# Patient Record
Sex: Male | Born: 1940
Health system: Southern US, Community
[De-identification: ages and names within clinical notes are randomized; demographics above are authoritative.]

## PROBLEM LIST (undated history)

## (undated) DIAGNOSIS — T7840XA Allergy, unspecified, initial encounter: Secondary | ICD-10-CM

## (undated) DIAGNOSIS — K648 Other hemorrhoids: Secondary | ICD-10-CM

## (undated) DIAGNOSIS — M1990 Unspecified osteoarthritis, unspecified site: Secondary | ICD-10-CM

## (undated) DIAGNOSIS — N529 Male erectile dysfunction, unspecified: Secondary | ICD-10-CM

## (undated) DIAGNOSIS — I10 Essential (primary) hypertension: Secondary | ICD-10-CM

## (undated) DIAGNOSIS — E785 Hyperlipidemia, unspecified: Secondary | ICD-10-CM

## (undated) DIAGNOSIS — I2510 Atherosclerotic heart disease of native coronary artery without angina pectoris: Secondary | ICD-10-CM

## (undated) HISTORY — DX: Other hemorrhoids: K64.8

## (undated) HISTORY — DX: Unspecified osteoarthritis, unspecified site: M19.90

## (undated) HISTORY — DX: Male erectile dysfunction, unspecified: N52.9

## (undated) HISTORY — DX: Atherosclerotic heart disease of native coronary artery without angina pectoris: I25.10

## (undated) HISTORY — DX: Hyperlipidemia, unspecified: E78.5

## (undated) HISTORY — DX: Allergy, unspecified, initial encounter: T78.40XA

## (undated) HISTORY — DX: Essential (primary) hypertension: I10

---

## 1997-06-09 HISTORY — PX: CORONARY ARTERY BYPASS GRAFT: SHX141

## 1997-06-11 ENCOUNTER — Inpatient Hospital Stay (HOSPITAL_COMMUNITY): Admission: RE | Admit: 1997-06-11 | Discharge: 1997-06-16 | Payer: Self-pay | Admitting: Cardiology

## 1997-07-07 ENCOUNTER — Encounter (HOSPITAL_COMMUNITY): Admission: RE | Admit: 1997-07-07 | Discharge: 1997-10-05 | Payer: Self-pay | Admitting: Cardiology

## 2003-08-12 ENCOUNTER — Ambulatory Visit (HOSPITAL_COMMUNITY): Admission: RE | Admit: 2003-08-12 | Discharge: 2003-08-12 | Payer: Self-pay | Admitting: Internal Medicine

## 2003-12-24 ENCOUNTER — Ambulatory Visit: Payer: Self-pay | Admitting: Family Medicine

## 2004-04-21 ENCOUNTER — Ambulatory Visit: Payer: Self-pay | Admitting: Family Medicine

## 2004-05-09 HISTORY — PX: HERNIA REPAIR: SHX51

## 2004-05-19 ENCOUNTER — Ambulatory Visit (HOSPITAL_BASED_OUTPATIENT_CLINIC_OR_DEPARTMENT_OTHER): Admission: RE | Admit: 2004-05-19 | Discharge: 2004-05-19 | Payer: Self-pay | Admitting: Surgery

## 2004-05-19 ENCOUNTER — Ambulatory Visit (HOSPITAL_COMMUNITY): Admission: RE | Admit: 2004-05-19 | Discharge: 2004-05-19 | Payer: Self-pay | Admitting: Surgery

## 2004-05-19 ENCOUNTER — Encounter (INDEPENDENT_AMBULATORY_CARE_PROVIDER_SITE_OTHER): Payer: Self-pay | Admitting: Specialist

## 2004-06-24 ENCOUNTER — Ambulatory Visit: Payer: Self-pay | Admitting: Family Medicine

## 2004-07-01 ENCOUNTER — Ambulatory Visit: Payer: Self-pay | Admitting: Family Medicine

## 2004-07-11 ENCOUNTER — Ambulatory Visit: Payer: Self-pay | Admitting: Internal Medicine

## 2004-07-15 ENCOUNTER — Ambulatory Visit: Payer: Self-pay | Admitting: Internal Medicine

## 2004-07-15 DIAGNOSIS — K648 Other hemorrhoids: Secondary | ICD-10-CM | POA: Insufficient documentation

## 2004-07-26 ENCOUNTER — Ambulatory Visit: Payer: Self-pay | Admitting: Internal Medicine

## 2005-06-26 ENCOUNTER — Ambulatory Visit: Payer: Self-pay | Admitting: Family Medicine

## 2005-07-10 ENCOUNTER — Ambulatory Visit: Payer: Self-pay | Admitting: Family Medicine

## 2005-07-25 ENCOUNTER — Ambulatory Visit: Payer: Self-pay

## 2005-07-28 ENCOUNTER — Ambulatory Visit: Payer: Self-pay | Admitting: Internal Medicine

## 2005-11-03 ENCOUNTER — Ambulatory Visit: Payer: Self-pay | Admitting: Family Medicine

## 2006-06-11 ENCOUNTER — Ambulatory Visit: Payer: Self-pay | Admitting: Family Medicine

## 2006-06-11 LAB — CONVERTED CEMR LAB
ALT: 26 units/L (ref 0–40)
AST: 28 units/L (ref 0–37)
Albumin: 4.3 g/dL (ref 3.5–5.2)
Alkaline Phosphatase: 75 units/L (ref 39–117)
BUN: 23 mg/dL (ref 6–23)
Basophils Absolute: 0 10*3/uL (ref 0.0–0.1)
Basophils Relative: 0.3 % (ref 0.0–1.0)
Bilirubin, Direct: 0.1 mg/dL (ref 0.0–0.3)
CO2: 31 meq/L (ref 19–32)
Calcium: 9.6 mg/dL (ref 8.4–10.5)
Chloride: 110 meq/L (ref 96–112)
Cholesterol: 178 mg/dL (ref 0–200)
Creatinine, Ser: 0.8 mg/dL (ref 0.4–1.5)
Eosinophils Absolute: 0.2 10*3/uL (ref 0.0–0.6)
Eosinophils Relative: 3.2 % (ref 0.0–5.0)
GFR calc Af Amer: 125 mL/min
GFR calc non Af Amer: 103 mL/min
Glucose, Bld: 95 mg/dL (ref 70–99)
HCT: 45.5 % (ref 39.0–52.0)
HDL: 55.2 mg/dL (ref >39.0)
Hemoglobin: 15.7 g/dL (ref 13.0–17.0)
LDL Cholesterol: 95 mg/dL (ref 0–99)
Lymphocytes Relative: 26.1 % (ref 12.0–46.0)
MCHC: 34.6 g/dL (ref 30.0–36.0)
MCV: 94.3 fL (ref 78.0–100.0)
Monocytes Absolute: 0.8 10*3/uL — ABNORMAL HIGH (ref 0.2–0.7)
Monocytes Relative: 12.5 % — ABNORMAL HIGH (ref 3.0–11.0)
Neutro Abs: 3.9 10*3/uL (ref 1.4–7.7)
Neutrophils Relative %: 57.9 % (ref 43.0–77.0)
PSA: 0.95 ng/mL (ref 0.10–4.00)
Platelets: 191 10*3/uL (ref 150–400)
Potassium: 4.7 meq/L (ref 3.5–5.1)
RBC: 4.82 M/uL (ref 4.22–5.81)
RDW: 11.4 % — ABNORMAL LOW (ref 11.5–14.6)
Sodium: 147 meq/L — ABNORMAL HIGH (ref 135–145)
TSH: 1.23 microintl units/mL (ref 0.35–5.50)
Total Bilirubin: 1.3 mg/dL — ABNORMAL HIGH (ref 0.3–1.2)
Total CHOL/HDL Ratio: 3.2
Total Protein: 7.5 g/dL (ref 6.0–8.3)
Triglycerides: 137 mg/dL (ref 0–149)
VLDL: 27 mg/dL (ref 0–40)
WBC: 6.6 10*3/uL (ref 4.5–10.5)

## 2006-06-26 ENCOUNTER — Ambulatory Visit: Payer: Self-pay | Admitting: Family Medicine

## 2006-07-06 ENCOUNTER — Ambulatory Visit: Payer: Self-pay | Admitting: Family Medicine

## 2006-07-11 ENCOUNTER — Ambulatory Visit: Payer: Self-pay | Admitting: Internal Medicine

## 2006-08-20 ENCOUNTER — Ambulatory Visit: Payer: Self-pay | Admitting: Internal Medicine

## 2006-09-17 ENCOUNTER — Telehealth (INDEPENDENT_AMBULATORY_CARE_PROVIDER_SITE_OTHER): Payer: Self-pay | Admitting: *Deleted

## 2006-10-04 DIAGNOSIS — I10 Essential (primary) hypertension: Secondary | ICD-10-CM | POA: Insufficient documentation

## 2006-10-04 DIAGNOSIS — E785 Hyperlipidemia, unspecified: Secondary | ICD-10-CM | POA: Insufficient documentation

## 2006-10-04 DIAGNOSIS — F528 Other sexual dysfunction not due to a substance or known physiological condition: Secondary | ICD-10-CM | POA: Insufficient documentation

## 2006-10-04 DIAGNOSIS — J309 Allergic rhinitis, unspecified: Secondary | ICD-10-CM | POA: Insufficient documentation

## 2006-10-11 DIAGNOSIS — R1013 Epigastric pain: Secondary | ICD-10-CM | POA: Insufficient documentation

## 2006-10-15 ENCOUNTER — Ambulatory Visit: Payer: Self-pay | Admitting: Family Medicine

## 2006-10-18 ENCOUNTER — Encounter: Admission: RE | Admit: 2006-10-18 | Discharge: 2006-10-18 | Payer: Self-pay | Admitting: Family Medicine

## 2006-10-18 ENCOUNTER — Telehealth: Payer: Self-pay | Admitting: Family Medicine

## 2006-11-01 ENCOUNTER — Ambulatory Visit: Payer: Self-pay | Admitting: Family Medicine

## 2007-04-13 DIAGNOSIS — E7800 Pure hypercholesterolemia, unspecified: Secondary | ICD-10-CM | POA: Insufficient documentation

## 2007-04-13 DIAGNOSIS — R1310 Dysphagia, unspecified: Secondary | ICD-10-CM | POA: Insufficient documentation

## 2007-04-13 DIAGNOSIS — J90 Pleural effusion, not elsewhere classified: Secondary | ICD-10-CM | POA: Insufficient documentation

## 2007-04-23 ENCOUNTER — Ambulatory Visit: Payer: Self-pay | Admitting: Family Medicine

## 2007-04-23 DIAGNOSIS — H8110 Benign paroxysmal vertigo, unspecified ear: Secondary | ICD-10-CM | POA: Insufficient documentation

## 2007-07-07 ENCOUNTER — Encounter: Payer: Self-pay | Admitting: Family Medicine

## 2007-07-08 ENCOUNTER — Ambulatory Visit: Payer: Self-pay | Admitting: Family Medicine

## 2007-07-08 DIAGNOSIS — I2510 Atherosclerotic heart disease of native coronary artery without angina pectoris: Secondary | ICD-10-CM | POA: Insufficient documentation

## 2007-07-08 DIAGNOSIS — M1990 Unspecified osteoarthritis, unspecified site: Secondary | ICD-10-CM | POA: Insufficient documentation

## 2007-07-08 LAB — CONVERTED CEMR LAB
ALT: 26 units/L (ref 0–53)
AST: 23 units/L (ref 0–37)
Albumin: 4.1 g/dL (ref 3.5–5.2)
Alkaline Phosphatase: 83 units/L (ref 39–117)
BUN: 14 mg/dL (ref 6–23)
Basophils Absolute: 0 10*3/uL (ref 0.0–0.1)
Basophils Relative: 0.3 % (ref 0.0–1.0)
Bilirubin Urine: NEGATIVE
Bilirubin, Direct: 0.1 mg/dL (ref 0.0–0.3)
Blood in Urine, dipstick: NEGATIVE
CO2: 31 meq/L (ref 19–32)
Calcium: 9.5 mg/dL (ref 8.4–10.5)
Chloride: 107 meq/L (ref 96–112)
Cholesterol: 147 mg/dL (ref 0–200)
Creatinine, Ser: 0.8 mg/dL (ref 0.4–1.5)
Eosinophils Absolute: 0.1 10*3/uL (ref 0.0–0.7)
Eosinophils Relative: 1.8 % (ref 0.0–5.0)
GFR calc Af Amer: 124 mL/min
GFR calc non Af Amer: 103 mL/min
Glucose, Bld: 103 mg/dL — ABNORMAL HIGH (ref 70–99)
Glucose, Urine, Semiquant: NEGATIVE
HCT: 46.7 % (ref 39.0–52.0)
HDL: 55.3 mg/dL (ref >39.0)
Hemoglobin: 16.1 g/dL (ref 13.0–17.0)
Ketones, urine, test strip: NEGATIVE
LDL Cholesterol: 68 mg/dL (ref 0–99)
Lymphocytes Relative: 23.8 % (ref 12.0–46.0)
MCHC: 34.4 g/dL (ref 30.0–36.0)
MCV: 93.2 fL (ref 78.0–100.0)
Monocytes Absolute: 0.6 10*3/uL (ref 0.1–1.0)
Monocytes Relative: 10.8 % (ref 3.0–12.0)
Neutro Abs: 3.8 10*3/uL (ref 1.4–7.7)
Neutrophils Relative %: 63.3 % (ref 43.0–77.0)
Nitrite: NEGATIVE
PSA: 0.57 ng/mL (ref 0.10–4.00)
Platelets: 180 10*3/uL (ref 150–400)
Potassium: 4.8 meq/L (ref 3.5–5.1)
Protein, U semiquant: NEGATIVE
RBC: 5.01 M/uL (ref 4.22–5.81)
RDW: 11.6 % (ref 11.5–14.6)
Sodium: 143 meq/L (ref 135–145)
Specific Gravity, Urine: 1.02
TSH: 1.09 microintl units/mL (ref 0.35–5.50)
Total Bilirubin: 1 mg/dL (ref 0.3–1.2)
Total CHOL/HDL Ratio: 2.7
Total Protein: 7.2 g/dL (ref 6.0–8.3)
Triglycerides: 118 mg/dL (ref 0–149)
Urobilinogen, UA: 0.2
VLDL: 24 mg/dL (ref 0–40)
WBC Urine, dipstick: NEGATIVE
WBC: 5.9 10*3/uL (ref 4.5–10.5)
pH: 6

## 2007-07-23 ENCOUNTER — Ambulatory Visit: Payer: Self-pay | Admitting: Internal Medicine

## 2007-08-08 ENCOUNTER — Telehealth: Payer: Self-pay | Admitting: Family Medicine

## 2007-12-23 ENCOUNTER — Telehealth: Payer: Self-pay | Admitting: Family Medicine

## 2008-05-18 ENCOUNTER — Telehealth: Payer: Self-pay | Admitting: Family Medicine

## 2008-07-07 ENCOUNTER — Ambulatory Visit: Payer: Self-pay | Admitting: Family Medicine

## 2008-07-10 ENCOUNTER — Ambulatory Visit: Payer: Self-pay | Admitting: Family Medicine

## 2008-07-16 ENCOUNTER — Telehealth (INDEPENDENT_AMBULATORY_CARE_PROVIDER_SITE_OTHER): Payer: Self-pay | Admitting: *Deleted

## 2008-07-20 ENCOUNTER — Encounter: Payer: Self-pay | Admitting: Cardiovascular Disease

## 2008-07-20 ENCOUNTER — Ambulatory Visit: Payer: Self-pay

## 2008-07-27 ENCOUNTER — Telehealth: Payer: Self-pay | Admitting: Family Medicine

## 2008-08-14 ENCOUNTER — Ambulatory Visit: Payer: Self-pay | Admitting: Family Medicine

## 2008-09-17 ENCOUNTER — Ambulatory Visit: Payer: Self-pay | Admitting: Family Medicine

## 2008-09-24 ENCOUNTER — Encounter: Payer: Self-pay | Admitting: Family Medicine

## 2009-07-15 ENCOUNTER — Ambulatory Visit: Payer: Self-pay | Admitting: Family Medicine

## 2009-07-15 DIAGNOSIS — R0789 Other chest pain: Secondary | ICD-10-CM | POA: Insufficient documentation

## 2009-07-19 ENCOUNTER — Telehealth (INDEPENDENT_AMBULATORY_CARE_PROVIDER_SITE_OTHER): Payer: Self-pay

## 2009-07-20 ENCOUNTER — Encounter: Payer: Self-pay | Admitting: Cardiology

## 2009-07-20 ENCOUNTER — Ambulatory Visit: Payer: Self-pay | Admitting: Cardiology

## 2009-07-20 ENCOUNTER — Ambulatory Visit: Payer: Self-pay

## 2009-07-20 ENCOUNTER — Encounter (HOSPITAL_COMMUNITY): Admission: RE | Admit: 2009-07-20 | Discharge: 2009-09-15 | Payer: Self-pay | Admitting: Family Medicine

## 2009-07-20 ENCOUNTER — Encounter: Payer: Self-pay | Admitting: Family Medicine

## 2009-07-22 ENCOUNTER — Telehealth: Payer: Self-pay | Admitting: Family Medicine

## 2010-02-06 LAB — CONVERTED CEMR LAB
ALT: 25 units/L (ref 0–53)
ALT: 27 units/L (ref 0–53)
AST: 26 units/L (ref 0–37)
AST: 31 units/L (ref 0–37)
Albumin: 4.1 g/dL (ref 3.5–5.2)
Albumin: 4.1 g/dL (ref 3.5–5.2)
Alkaline Phosphatase: 67 units/L (ref 39–117)
Alkaline Phosphatase: 70 units/L (ref 39–117)
BUN: 18 mg/dL (ref 6–23)
BUN: 24 mg/dL — ABNORMAL HIGH (ref 6–23)
Basophils Absolute: 0 10*3/uL (ref 0.0–0.1)
Basophils Absolute: 0 10*3/uL (ref 0.0–0.1)
Basophils Relative: 0.2 % (ref 0.0–3.0)
Basophils Relative: 0.4 % (ref 0.0–3.0)
Bilirubin Urine: NEGATIVE
Bilirubin Urine: NEGATIVE
Bilirubin, Direct: 0.1 mg/dL (ref 0.0–0.3)
Bilirubin, Direct: 0.2 mg/dL (ref 0.0–0.3)
Blood in Urine, dipstick: NEGATIVE
Blood in Urine, dipstick: NEGATIVE
CO2: 26 meq/L (ref 19–32)
CO2: 30 meq/L (ref 19–32)
Calcium: 9.2 mg/dL (ref 8.4–10.5)
Calcium: 9.4 mg/dL (ref 8.4–10.5)
Chloride: 106 meq/L (ref 96–112)
Chloride: 108 meq/L (ref 96–112)
Cholesterol: 131 mg/dL (ref 0–200)
Cholesterol: 142 mg/dL (ref 0–200)
Creatinine, Ser: 0.7 mg/dL (ref 0.4–1.5)
Creatinine, Ser: 0.8 mg/dL (ref 0.4–1.5)
Eosinophils Absolute: 0.1 10*3/uL (ref 0.0–0.7)
Eosinophils Absolute: 0.1 10*3/uL (ref 0.0–0.7)
Eosinophils Relative: 1.6 % (ref 0.0–5.0)
Eosinophils Relative: 2.3 % (ref 0.0–5.0)
GFR calc non Af Amer: 102.36 mL/min (ref >60)
GFR calc non Af Amer: 123.09 mL/min (ref >60)
Glucose, Bld: 88 mg/dL (ref 70–99)
Glucose, Bld: 98 mg/dL (ref 70–99)
Glucose, Urine, Semiquant: NEGATIVE
Glucose, Urine, Semiquant: NEGATIVE
HCT: 43 % (ref 39.0–52.0)
HCT: 45.5 % (ref 39.0–52.0)
HDL: 51.9 mg/dL (ref >39.00)
HDL: 58.6 mg/dL (ref >39.00)
Hemoglobin: 14.9 g/dL (ref 13.0–17.0)
Hemoglobin: 15.7 g/dL (ref 13.0–17.0)
Ketones, urine, test strip: NEGATIVE
Ketones, urine, test strip: NEGATIVE
LDL Cholesterol: 55 mg/dL (ref 0–99)
LDL Cholesterol: 71 mg/dL (ref 0–99)
Lymphocytes Relative: 18.6 % (ref 12.0–46.0)
Lymphocytes Relative: 24 % (ref 12.0–46.0)
Lymphs Abs: 1.3 10*3/uL (ref 0.7–4.0)
Lymphs Abs: 1.4 10*3/uL (ref 0.7–4.0)
MCHC: 34.6 g/dL (ref 30.0–36.0)
MCHC: 34.6 g/dL (ref 30.0–36.0)
MCV: 93.1 fL (ref 78.0–100.0)
MCV: 94 fL (ref 78.0–100.0)
Monocytes Absolute: 0.6 10*3/uL (ref 0.1–1.0)
Monocytes Absolute: 0.7 10*3/uL (ref 0.1–1.0)
Monocytes Relative: 10.2 % (ref 3.0–12.0)
Monocytes Relative: 8.9 % (ref 3.0–12.0)
Neutro Abs: 3.4 10*3/uL (ref 1.4–7.7)
Neutro Abs: 5.4 10*3/uL (ref 1.4–7.7)
Neutrophils Relative %: 63.3 % (ref 43.0–77.0)
Neutrophils Relative %: 70.5 % (ref 43.0–77.0)
Nitrite: NEGATIVE
Nitrite: NEGATIVE
PSA: 0.57 ng/mL (ref 0.10–4.00)
PSA: 0.69 ng/mL (ref 0.10–4.00)
Platelets: 158 10*3/uL (ref 150.0–400.0)
Platelets: 180 10*3/uL (ref 150.0–400.0)
Potassium: 4.2 meq/L (ref 3.5–5.1)
Potassium: 4.3 meq/L (ref 3.5–5.1)
Protein, U semiquant: NEGATIVE
Protein, U semiquant: NEGATIVE
RBC: 4.58 M/uL (ref 4.22–5.81)
RBC: 4.88 M/uL (ref 4.22–5.81)
RDW: 11.5 % (ref 11.5–14.6)
RDW: 12.9 % (ref 11.5–14.6)
Sodium: 141 meq/L (ref 135–145)
Sodium: 145 meq/L (ref 135–145)
Specific Gravity, Urine: 1.02
Specific Gravity, Urine: 1.025
TSH: 0.77 microintl units/mL (ref 0.35–5.50)
TSH: 0.93 microintl units/mL (ref 0.35–5.50)
Total Bilirubin: 1.2 mg/dL (ref 0.3–1.2)
Total Bilirubin: 1.3 mg/dL — ABNORMAL HIGH (ref 0.3–1.2)
Total CHOL/HDL Ratio: 2
Total CHOL/HDL Ratio: 3
Total Protein: 6.8 g/dL (ref 6.0–8.3)
Total Protein: 7.1 g/dL (ref 6.0–8.3)
Triglycerides: 121 mg/dL (ref 0.0–149.0)
Triglycerides: 63 mg/dL (ref 0.0–149.0)
Urobilinogen, UA: 0.2
Urobilinogen, UA: 0.2
VLDL: 12.6 mg/dL (ref 0.0–40.0)
VLDL: 24.2 mg/dL (ref 0.0–40.0)
WBC Urine, dipstick: NEGATIVE
WBC Urine, dipstick: NEGATIVE
WBC: 5.4 10*3/uL (ref 4.5–10.5)
WBC: 7.7 10*3/uL (ref 4.5–10.5)
pH: 5.5
pH: 5.5

## 2010-02-08 NOTE — Progress Notes (Signed)
Summary: Wants results of stress test  Phone Note Call from Patient   Summary of Call: Patient requesting results of his stress test. Patient can be reached at [phone]. Initial call taken by: Darra Lis RMA,  July 27, 2008 1:28 PM    called...normal

## 2010-02-08 NOTE — Assessment & Plan Note (Signed)
Summary: stress test per dr todd/njr    Exercise Tolerance Test Cardiovascular Risk History:      Positive major cardiovascular risk factors include male age over 68 years old, hyperlipidemia, and hypertension.  Negative major cardiovascular risk factors include non-tobacco-user status.        Positive history for target organ damage include ASHD (either angina; prior MI; prior CABG).    Baseline EKG:    Rhythm:     normal sinus    Rate:       75  Exercise Tolerance Test Results:    Ordering MD:        todd    Interpreting MD:     Michaiah Holsopple    Contraindication to ETT:   no    Stress Modality:     exercise-treadmill    MPHR (bpm):        154    85% MPHR (bpm):     131    ST Segment analysis:       At Rest:       normal ST segments-no evidence of significant ST depression       With Exercise:     no evidence of significant ST depression    Arrhythmia:             no    Angina during ETT:     absent (0)    Current Allergies: No known allergies         Complete Medication List: 1)  Lipitor 20 Mg Tabs (Atorvastatin calcium) .Marland Kitchen.. 1 at bedtime 2)  Nitrostat 0.4 Mg Subl (Nitroglycerin) .... Place 1 tablet under tongue as directed 3)  Toprol Xl 50 Mg Tb24 (Metoprolol succinate) .... Take 1 tablet by mouth once a day 4)  Valtrex 1 Gm Tabs (Valacyclovir hcl) .... Take 1/2 tablet by mouth once a day 5)  Viagra 100 Mg Tabs (Sildenafil citrate) .... Take 1 tablet by mouth as directed 6)  Zyrtec Allergy 10 Mg Tabs (Cetirizine hcl) .... Qd 7)  Aspirin 325 Mg Tbec (Aspirin) .... Qd  Cardiovascular Risk Assessment/Plan:      The patient's hypertensive risk group is category C: Target organ damage and/or diabetes.    Exercise Tolerance Test Assessment:    Quality of ETT:   diagnostic    ETT Interpretation:   normal-no evidence of ischemia by ST analysis    ]

## 2010-02-08 NOTE — Assessment & Plan Note (Signed)
Summary: emp patient fasting/mhf   Vital Signs:  Patient Profile:   70 Years Old Male Height:     70 inches Weight:      177 pounds Temp:     98.1 degrees F oral Pulse rate:   60 / minute BP sitting:   130 / 80  (left arm)  Vitals Entered By: Kern Reap CMA (July 08, 2007 9:31 AM)                 Chief Complaint:  cpx.  History of Present Illness: William York is a 70 year old male, who comes in today for annual examination.  Because of underlying coronary disease with a bypass surgery in 1999 history of hyperlipidemia hypertension, allergic rhinitis osteoarthritis, and erectile dysfunction.  He states his health is good.  He has no complaints.  This year.  He had a spell of vertigo that is gradually resolved.  Every still has an occasional spell when he feels lightheaded.  He also had an evaluation of pain in his right upper tibia orthopedic studies were all negative.  Pains gone.  No diagnosis was ever made.    Current Allergies: No known allergies   Past Medical History:    Reviewed history from 10/04/2006 and no changes required:       Allergic rhinitis       Hyperlipidemia       Hypertension       erectile dysfunction       Coronary artery disease CABC 99       Osteoarthritis   Family History:    Reviewed history and no changes required:       father died of Struble, vascular disease.              Mother died from old age 49 brother in good health.  No sisters son, Xzavion and daughter, [name]  Social History:    Reviewed history from 04/23/2007 and no changes required:       Retired       Married       Never Smoked       Alcohol use-yes       Drug use-no       Regular exercise-yes    Review of Systems      See HPI   Physical Exam  General:     Well-developed,well-nourished,in no acute distress; alert,appropriate and cooperative throughout examination Head:     Normocephalic and atraumatic without obvious abnormalities. No apparent alopecia or balding.  Eyes:     No corneal or conjunctival inflammation noted. EOMI. Perrla. Funduscopic exam benign, without hemorrhages, exudates or papilledema. Vision grossly normal. Ears:     External ear exam shows no significant lesions or deformities.  Otoscopic examination reveals clear canals, tympanic membranes are intact bilaterally without bulging, retraction, inflammation or discharge. Hearing is grossly normal bilaterally. Nose:     External nasal examination shows no deformity or inflammation. Nasal mucosa are pink and moist without lesions or exudates. Mouth:     Oral mucosa and oropharynx without lesions or exudates.  Teeth in good repair. Neck:     No deformities, masses, or tenderness noted. Chest Wall:     No deformities, masses, tenderness or gynecomastia noted. Breasts:     No masses or gynecomastia noted Lungs:     Normal respiratory effort, chest expands symmetrically. Lungs are clear to auscultation, no crackles or wheezes. Heart:     Normal rate and regular rhythm. S1 and S2 normal without  gallop, murmur, click, rub or other extra sounds. Abdomen:     Bowel sounds positive,abdomen soft and non-tender without masses, organomegaly or hernias noted. Rectal:     No external abnormalities noted. Normal sphincter tone. No rectal masses or tenderness. Genitalia:     Testes bilaterally descended without nodularity, tenderness or masses. No scrotal masses or lesions. No penis lesions or urethral discharge. Prostate:     Prostate gland firm and smooth, no enlargement, nodularity, tenderness, mass, asymmetry or induration. Msk:     No deformity or scoliosis noted of thoracic or lumbar spine.   Pulses:     R and L carotid,radial,femoral,dorsalis pedis and posterior tibial pulses are full and equal bilaterally Extremities:     No clubbing, cyanosis, edema, or deformity noted with normal full range of motion of all joints.   Neurologic:     No cranial nerve deficits noted. Station and gait  are normal. Plantar reflexes are down-going bilaterally. DTRs are symmetrical throughout. Sensory, motor and coordinative functions appear intact. Skin:     Intact without suspicious lesions or rashes Cervical Nodes:     No lymphadenopathy noted Axillary Nodes:     No palpable lymphadenopathy Inguinal Nodes:     No significant adenopathy Psych:     Cognition and judgment appear intact. Alert and cooperative with normal attention span and concentration. No apparent delusions, illusions, hallucinations    Impression & Recommendations:  Problem # 1:  CORONARY ARTERY DISEASE (ICD-414.00) Assessment: Unchanged  His updated medication list for this problem includes:    Nitrostat 0.4 Mg Subl (Nitroglycerin) .Marland Kitchen... Place 1 tablet under tongue as directed    Toprol Xl 50 Mg Tb24 (Metoprolol succinate) .Marland Kitchen... Take 1 tablet by mouth once a day    Aspirin 325 Mg Tbec (Aspirin) ..... Qd  Orders: TLB-PSA (Prostate Specific Antigen) (84153-PSA) EKG w/ Interpretation (93000)   Problem # 2:  OSTEOARTHRITIS (ICD-715.90) Assessment: Unchanged  The following medications were removed from the medication list:    Meloxicam 15 Mg Tabs (Meloxicam) ..... Stopped  His updated medication list for this problem includes:    Aspirin 325 Mg Tbec (Aspirin) ..... Qd  Orders: TLB-PSA (Prostate Specific Antigen) (84153-PSA)   Problem # 3:  HYPERCHOLESTEROLEMIA (ICD-272.0) Assessment: Improved  His updated medication list for this problem includes:    Lipitor 20 Mg Tabs (Atorvastatin calcium) .Marland Kitchen... 1 at bedtime  Orders: EKG w/ Interpretation (93000)   Problem # 4:  ERECTILE DYSFUNCTION (ICD-302.72) Assessment: Improved  His updated medication list for this problem includes:    Viagra 100 Mg Tabs (Sildenafil citrate) .Marland Kitchen... Take 1 tablet by mouth as directed  Orders: TLB-PSA (Prostate Specific Antigen) (84153-PSA)   Problem # 5:  HYPERTENSION (ICD-401.9) Assessment: Improved  His updated  medication list for this problem includes:    Toprol Xl 50 Mg Tb24 (Metoprolol succinate) .Marland Kitchen... Take 1 tablet by mouth once a day  Orders: TLB-PSA (Prostate Specific Antigen) (84153-PSA) EKG w/ Interpretation (93000)   Problem # 6:  ALLERGIC RHINITIS (ICD-477.9) Assessment: Improved  His updated medication list for this problem includes:    Zyrtec Allergy 10 Mg Tabs (Cetirizine hcl) ..... Qd  Orders: TLB-PSA (Prostate Specific Antigen) (84153-PSA)   Complete Medication List: 1)  Lipitor 20 Mg Tabs (Atorvastatin calcium) .Marland Kitchen.. 1 at bedtime 2)  Nitrostat 0.4 Mg Subl (Nitroglycerin) .... Place 1 tablet under tongue as directed 3)  Toprol Xl 50 Mg Tb24 (Metoprolol succinate) .... Take 1 tablet by mouth once a day 4)  Valtrex 1  Gm Tabs (Valacyclovir hcl) .... Take 1/2 tablet by mouth once a day 5)  Viagra 100 Mg Tabs (Sildenafil citrate) .... Take 1 tablet by mouth as directed 6)  Zyrtec Allergy 10 Mg Tabs (Cetirizine hcl) .... Qd 7)  Aspirin 325 Mg Tbec (Aspirin) .... Qd  Other Orders: Venipuncture (96295) TLB-Lipid Panel (80061-LIPID) TLB-BMP (Basic Metabolic Panel-BMET) (80048-METABOL) TLB-CBC Platelet - w/Differential (85025-CBCD) TLB-Hepatic/Liver Function Pnl (80076-HEPATIC) TLB-TSH (Thyroid Stimulating Hormone) (28413-KGM)   Patient Instructions: 1)  Please schedule a follow-up appointment in 1 year. 2)  please set up a stress test with Dr. Smitty Cords swords   Prescriptions: VIAGRA 100 MG TABS (SILDENAFIL CITRATE) Take 1 tablet by mouth as directed  #6 x 11   Entered and Authorized by:   Roderick Pee MD   Signed by:   Roderick Pee MD on 07/08/2007   Method used:   Print then Give to Patient   RxID:   [id number] NITROSTAT 0.4 MG SUBL (NITROGLYCERIN) Place 1 tablet under tongue as directed  #25 x 1   Entered and Authorized by:   Roderick Pee MD   Signed by:   Roderick Pee MD on 07/08/2007   Method used:   Print then Give to Patient   RxID:   [id number]  JJOACZ ALLERGY 10 MG  TABS (CETIRIZINE HCL) qd  #100 x 3   Entered and Authorized by:   Roderick Pee MD   Signed by:   Roderick Pee MD on 07/08/2007   Method used:   Print then Give to Patient   RxID:   [id number] VALTREX 1 GM TABS (VALACYCLOVIR HCL) Take 1/2 tablet by mouth once a day  #100 x 3   Entered and Authorized by:   Roderick Pee MD   Signed by:   Roderick Pee MD on 07/08/2007   Method used:   Print then Give to Patient   RxID:   [id number] TOPROL XL 50 MG TB24 (METOPROLOL SUCCINATE) Take 1 tablet by mouth once a day  #100 x 3   Entered and Authorized by:   Roderick Pee MD   Signed by:   Roderick Pee MD on 07/08/2007   Method used:   Print then Give to Patient   RxID:   [id number] LIPITOR 20 MG  TABS (ATORVASTATIN CALCIUM) 1 at bedtime  #100 x 3   Entered and Authorized by:   Roderick Pee MD   Signed by:   Roderick Pee MD on 07/08/2007   Method used:   Print then Give to Patient   RxID:   [id number]  ] Laboratory Results   Urine Tests   Date/Time Reported: July 08, 2007 11:05 AM   Routine Urinalysis   Color: yellow Appearance: Clear Glucose: negative   (Normal Range: Negative) Bilirubin: negative   (Normal Range: Negative) Ketone: negative   (Normal Range: Negative) Spec. Gravity: 1.020   (Normal Range: 1.003-1.035) Blood: negative   (Normal Range: Negative) pH: 6.0   (Normal Range: 5.0-8.0) Protein: negative   (Normal Range: Negative) Urobilinogen: 0.2   (Normal Range: 0-1) Nitrite: negative   (Normal Range: Negative) Leukocyte Esterace: negative   (Normal Range: Negative)    Comments: Darra Lis RMA  July 08, 2007 11:06 AM

## 2010-02-08 NOTE — Assessment & Plan Note (Signed)
Summary: PT WILL COME IN FASTING // RS/PT [facility] FROM [facility]   Vital Signs:  Patient profile:   70 year old male Height:      70 inches Weight:      178 pounds BMI:     25.63 Temp:     98.1 degrees F oral BP sitting:   140 / 84  (left arm) Cuff size:   regular  Vitals Entered By: Kern Reap CMA Duncan Dull) (July 15, 2009 8:27 AM) CC: cpx Is Patient Diabetic? No Pain Assessment Patient in pain? no        CC:  cpx.  History of Present Illness: William York is a delightful, 70 year old, married male, nonsmoker, who comes in today for annual physical examination because of underlying hyperlipidemia, hypertension, coronary disease.  His hypertension is treated with Toprol 50 mg daily.  BP 140/84.also Cozaar 75 mg daily.  And one aspirin tablet.  His hyperlipidemia is treated with Lipitor 20 mg nightly Will check fasting lipid panel today.  His underlying coronary disease, currently asymptomatic, however, 3 weeks ago.  He had a bout of chest pain.  He described as a burning tightness and he points to his left upper anterior chest wall as a source of his pain.  He says it went would come and go over 3 to 4 hour period of time.  It did not radiate.  On a scale of one to 10.  He was given a 5.  It suddenly went away and he felt fine.  He had no cardiac or pulmonary symptoms.  This occurred while he was away in [place].  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  However, since these had underlying disease.  Will get him set up for cardiac stress test.  He also uses V  100 mg p.r.n.  He also takes acyclovir 200  mg daily. ....Marland KitchenHere for Medicare AWV:  1.   Risk factors based on Past M, S, F history:......total review negative except for the left-sided chest pain as noted above 2.   Physical Activities: Marland Kitchen..walks plays golf 3 days a week, works at [employer]  The days he doesn't play golf 3.    Depression/mood: .......good no depression 4.   Hearing: .......normal 5.   ADL's: .........Marland Kitchenreviewed no change normal 6.   Fall Risk: .........Marland Kitchenreviewed none 7.   Home Safety: ........Marland Kitchenreviewed normal 8.   Height, weight, &visual acuity:.......Marland Kitchenheight weight, and unchanged.  Anuli exam 9.   Counseling: .........advised cardiac stress test because of recent chest.  10.   Labs ordered based on risk factors: ......  Tod 11.           Referral Coordination 12.           Care Plan 13.            Cognitive Assessment   Allergies (verified): No Known Drug Allergies  Past History:  Past medical, surgical, family and social histories (including risk factors) reviewed, and no changes noted (except as noted below).  Past Medical History: Reviewed history from 07/08/2007 and no changes required. Allergic rhinitis Hyperlipidemia Hypertension erectile dysfunction Coronary artery disease CABC 99 Osteoarthritis  Past Surgical History: CABG.99  Family History: Reviewed history from 07/08/2007 and no changes required. father died of Struble, vascular disease.  Mother died from old age 42 brother in good health.  No sisters  son, Zyir and daughter, [name]  Social History: Reviewed history from 04/23/2007 and no changes required. Retired Married Never Smoked Alcohol use-yes Drug use-no Regular exercise-yes  Review of Systems      See HPI  Physical Exam  General:  Well-developed,well-nourished,in no acute distress; alert,appropriate and cooperative throughout examination Head:  Normocephalic and atraumatic without obvious abnormalities. No apparent alopecia or balding. Eyes:  No corneal or conjunctival inflammation noted. EOMI. Perrla. Funduscopic exam benign, without hemorrhages, exudates or papilledema. Vision grossly normal. Ears:  External ear exam shows no significant lesions or deformities.  Otoscopic examination reveals clear canals, tympanic membranes are intact bilaterally  without bulging, retraction, inflammation or discharge. Hearing is grossly normal bilaterally. Mouth:  Oral mucosa and oropharynx without lesions or exudates.  Teeth in good repair. Neck:  No deformities, masses, or tenderness noted. Chest Wall:  No deformities, masses, tenderness or gynecomastia noted. Breasts:  No masses or gynecomastia noted Lungs:  Normal respiratory effort, chest expands symmetrically. Lungs are clear to auscultation, no crackles or wheezes. Heart:  Normal rate and regular rhythm. S1 and S2 normal without gallop, murmur, click, rub or other extra sounds. Abdomen:  Bowel sounds positive,abdomen soft and non-tender without masses, organomegaly or hernias noted. Rectal:  No external abnormalities noted. Normal sphincter tone. No rectal masses or tenderness. Genitalia:  Testes bilaterally descended without nodularity, tenderness or masses. No scrotal masses or lesions. No penis lesions or urethral discharge. Prostate:  Prostate gland firm and smooth, no enlargement, nodularity, tenderness, mass, asymmetry or induration. Msk:  No deformity or scoliosis noted of thoracic or lumbar spine.   Pulses:  R and L carotid,radial,femoral,dorsalis pedis and posterior tibial pulses are full and equal bilaterally Extremities:  No clubbing, cyanosis, edema, or deformity noted with normal full range of motion of all joints.   Neurologic:  No cranial nerve deficits noted. Station and gait are normal. Plantar reflexes are down-going bilaterally. DTRs are symmetrical throughout. Sensory, motor and coordinative functions appear intact. Skin:  Intact without suspicious lesions or rashes Cervical Nodes:  No lymphadenopathy noted Axillary Nodes:  No palpable lymphadenopathy Inguinal Nodes:  No significant adenopathy Psych:  Cognition and judgment appear intact. Alert and cooperative with normal attention span and concentration. No apparent delusions, illusions, hallucinations   Impression &  Recommendations:  Problem # 1:  CORONARY ARTERY DISEASE (ICD-414.00) Assessment Unchanged  His updated medication list for this problem includes:    Nitrostat 0.4 Mg Subl (Nitroglycerin) .Marland Kitchen... Place 1 tablet under tongue as directed    Toprol Xl 50 Mg Tb24 (Metoprolol succinate) .Marland Kitchen... Take 1 tablet by mouth once a day    Aspirin 325 Mg Tbec (Aspirin) ..... Qd    Cozaar 50 Mg Tabs (Losartan potassium) .Marland Kitchen... 1 and 1/2 qam  Orders: Venipuncture (19147) Prescription Created Electronically [phone] EKG w/ Interpretation (93000) Cardiolite (Cardiolite) TLB-Lipid Panel (80061-LIPID) TLB-BMP (Basic Metabolic Panel-BMET) (80048-METABOL) TLB-CBC Platelet - w/Differential (85025-CBCD) TLB-Hepatic/Liver Function Pnl (80076-HEPATIC) TLB-TSH (Thyroid Stimulating Hormone) (84443-TSH) TLB-PSA (Prostate Specific Antigen) (84153-PSA)  Problem # 2:  HYPERCHOLESTEROLEMIA (ICD-272.0) Assessment: Improved  His updated medication list for this problem includes:    Lipitor 20 Mg Tabs (Atorvastatin calcium) .Marland Kitchen... 1 at bedtime  Orders: Venipuncture (21308) Prescription Created Electronically [phone]) EKG w/ Interpretation (93000) TLB-Lipid Panel (80061-LIPID) TLB-BMP (Basic Metabolic Panel-BMET) (80048-METABOL) TLB-CBC Platelet - w/Differential (85025-CBCD) TLB-Hepatic/Liver Function Pnl (80076-HEPATIC) TLB-TSH (Thyroid Stimulating Hormone) (84443-TSH) TLB-PSA (Prostate Specific Antigen) (84153-PSA)  Problem # 3:  ERECTILE DYSFUNCTION (ICD-302.72) Assessment: Improved  His updated medication list for this problem  includes:    Viagra 100 Mg Tabs (Sildenafil citrate) .Marland Kitchen... Take 1 tablet by mouth as directed  Orders: Venipuncture (44034) Prescription Created Electronically [phone]) TLB-Lipid Panel (80061-LIPID) TLB-BMP (Basic Metabolic Panel-BMET) (80048-METABOL) TLB-CBC Platelet - w/Differential (85025-CBCD) TLB-Hepatic/Liver Function Pnl (80076-HEPATIC) TLB-TSH (Thyroid Stimulating Hormone)  (84443-TSH) TLB-PSA (Prostate Specific Antigen) (84153-PSA)  Problem # 4:  HYPERTENSION (ICD-401.9) Assessment: Improved  His updated medication list for this problem includes:    Toprol Xl 50 Mg Tb24 (Metoprolol succinate) .Marland Kitchen... Take 1 tablet by mouth once a day    Cozaar 50 Mg Tabs (Losartan potassium) .Marland Kitchen... 1 and 1/2 qam  Orders: Venipuncture (56387) Prescription Created Electronically [phone]) TLB-Lipid Panel (80061-LIPID) TLB-BMP (Basic Metabolic Panel-BMET) (80048-METABOL) TLB-CBC Platelet - w/Differential (85025-CBCD) TLB-Hepatic/Liver Function Pnl (80076-HEPATIC) TLB-TSH (Thyroid Stimulating Hormone) (84443-TSH) TLB-PSA (Prostate Specific Antigen) (84153-PSA)  Problem # 5:  CHEST PAIN, ATYPICAL (ICD-786.59) Assessment: New  Orders: Venipuncture (29518) Prescription Created Electronically [phone]) Cardiolite (Cardiolite) TLB-Lipid Panel (80061-LIPID) TLB-BMP (Basic Metabolic Panel-BMET) (80048-METABOL) TLB-CBC Platelet - w/Differential (85025-CBCD) TLB-Hepatic/Liver Function Pnl (80076-HEPATIC) TLB-TSH (Thyroid Stimulating Hormone) (84443-TSH) TLB-PSA (Prostate Specific Antigen) (84153-PSA)  Complete Medication List: 1)  Lipitor 20 Mg Tabs (Atorvastatin calcium) .Marland Kitchen.. 1 at bedtime 2)  Nitrostat 0.4 Mg Subl (Nitroglycerin) .... Place 1 tablet under tongue as directed 3)  Toprol Xl 50 Mg Tb24 (Metoprolol succinate) .... Take 1 tablet by mouth once a day 4)  Viagra 100 Mg Tabs (Sildenafil citrate) .... Take 1 tablet by mouth as directed 5)  Aspirin 325 Mg Tbec (Aspirin) .... Qd 6)  Acyclovir 200 Mg Caps (Acyclovir) .... Take one tab once daily 7)  Cozaar 50 Mg Tabs (Losartan potassium) .Marland Kitchen.. 1 and 1/2 qam  Other Orders: UA Dipstick w/o Micro (automated)  (81003)  Patient Instructions: 1)  we will get to set up for a cardiac stress test ASAP.  In the meantime if you have any symptoms consistent with angina taken nitroglycerin immediately call 911 and come to the  hospital 2)  Please schedule a follow-up appointment in 1 year. Prescriptions: COZAAR 50 MG TABS (LOSARTAN POTASSIUM) 1 and 1/2 qam  #150 x 3   Entered and Authorized by:   Roderick Pee MD   Signed by:   Roderick Pee MD on 07/15/2009   Method used:   Print then Give to Patient   RxID:   [id number] ACYCLOVIR 200 MG  CAPS (ACYCLOVIR) take one tab once daily  #100 x 4   Entered and Authorized by:   Roderick Pee MD   Signed by:   Roderick Pee MD on 07/15/2009   Method used:   Print then Give to Patient   RxID:   [id number] VIAGRA 100 MG TABS (SILDENAFIL CITRATE) Take 1 tablet by mouth as directed  #6 x 11   Entered and Authorized by:   Roderick Pee MD   Signed by:   Roderick Pee MD on 07/15/2009   Method used:   Print then Give to Patient   RxID:   [id number] TOPROL XL 50 MG TB24 (METOPROLOL SUCCINATE) Take 1 tablet by mouth once a day  #100 x 4   Entered and Authorized by:   Roderick Pee MD   Signed by:   Roderick Pee MD on 07/15/2009   Method used:   Print then Give to Patient   RxID:   [id number] NITROSTAT 0.4 MG SUBL (NITROGLYCERIN) Place 1 tablet under tongue as directed  #25 x 1   Entered and Authorized by:  Roderick Pee MD   Signed by:   Roderick Pee MD on 07/15/2009   Method used:   Print then Give to Patient   RxID:   [id number] LIPITOR 20 MG  TABS (ATORVASTATIN CALCIUM) 1 at bedtime  #100 x 4   Entered and Authorized by:   Roderick Pee MD   Signed by:   Roderick Pee MD on 07/15/2009   Method used:   Print then Give to Patient   RxID:   [id number]    Immunization History:  Influenza Immunization History:    Influenza:  historical (10/09/2008)   Laboratory Results   Urine Tests  Date/Time Recieved: July 15, 2009 12:21 PM  Date/Time Reported: July 15, 2009 12:21 PM   Routine Urinalysis   Color: yellow Appearance: Clear Glucose: negative   (Normal Range: Negative) Bilirubin: negative   (Normal Range:  Negative) Ketone: negative   (Normal Range: Negative) Spec. Gravity: 1.025   (Normal Range: 1.003-1.035) Blood: negative   (Normal Range: Negative) pH: 5.5   (Normal Range: 5.0-8.0) Protein: negative   (Normal Range: Negative) Urobilinogen: 0.2   (Normal Range: 0-1) Nitrite: negative   (Normal Range: Negative) Leukocyte Esterace: negative   (Normal Range: Negative)    Comments: Wynona Canes, CMA  July 15, 2009 12:21 PM

## 2010-02-08 NOTE — Assessment & Plan Note (Signed)
Summary: 3 day rov/njr   Vital Signs:  Patient profile:   70 year old male Weight:      181 pounds Temp:     98.2 degrees F oral BP sitting:   168 / 88  (left arm) Cuff size:   regular  Vitals Entered By: Kern Reap CMA (July 10, 2008 3:53 PM)  Reason for Visit follow up blood pressure  History of Present Illness: William York is a 70 year old male, who comes in today for reevaluation of hypertension.  We saw him for a physical examination 3 days ago.  His blood pressure.  The first time was elevated.  He's been monitoring at home and is consistently elevated 168/88.  He is compliant with his Toprol 50 mg daily pulse ranges between 50 and 60 on the Toprol  Allergies (verified): No Known Drug Allergies PMH reviewed for relevance  Social History: Reviewed history from 04/23/2007 and no changes required. Retired Married Never Smoked Alcohol use-yes Drug use-no Regular exercise-yes  Review of Systems      See HPI  Physical Exam  General:  Well-developed,well-nourished,in no acute distress; alert,appropriate and cooperative throughout examination Heart:  170/90   Impression & Recommendations:  Problem # 1:  HYPERTENSION (ICD-401.9) Assessment Deteriorated  His updated medication list for this problem includes:    Toprol Xl 50 Mg Tb24 (Metoprolol succinate) .Marland Kitchen... Take 1 tablet by mouth once a day    Cozaar 25 Mg Tabs (Losartan potassium) .Marland Kitchen... Take 1 tablet by mouth every morning  Orders: Prescription Created Electronically [phone])  Complete Medication List: 1)  Lipitor 20 Mg Tabs (Atorvastatin calcium) .Marland Kitchen.. 1 at bedtime 2)  Nitrostat 0.4 Mg Subl (Nitroglycerin) .... Place 1 tablet under tongue as directed 3)  Toprol Xl 50 Mg Tb24 (Metoprolol succinate) .... Take 1 tablet by mouth once a day 4)  Viagra 100 Mg Tabs (Sildenafil citrate) .... Take 1 tablet by mouth as directed 5)  Zyrtec Allergy 10 Mg Tabs (Cetirizine hcl) .... Qd 6)  Aspirin 325 Mg Tbec (Aspirin) ....  Qd 7)  Acyclovir 200 Mg Caps (Acyclovir) .... Take one tab once daily 8)  Cozaar 25 Mg Tabs (Losartan potassium) .... Take 1 tablet by mouth every morning  Patient Instructions: 1)  begin Cozaar 25 mg daily, check her BP daily.  Return in 5 weeks for follow-up Prescriptions: COZAAR 25 MG TABS (LOSARTAN POTASSIUM) Take 1 tablet by mouth every morning  #100 x 3   Entered and Authorized by:   Roderick Pee MD   Signed by:   Roderick Pee MD on 07/10/2008   Method used:   Print then Give to Patient   RxID:   [id number] COZAAR 25 MG TABS (LOSARTAN POTASSIUM) Take 1 tablet by mouth every morning  #30 x 3   Entered and Authorized by:   Roderick Pee MD   Signed by:   Roderick Pee MD on 07/10/2008   Method used:   Print then Give to Patient   RxID:   [id number]

## 2010-02-08 NOTE — Progress Notes (Signed)
  Phone Note Outgoing Call   Call placed by: JAT Call placed to: Patient Summary of Call:  the patient was called .the ultrasound was normal.  He was instructed if he has any further problems to call us for reevaluation.  Initial call taken by: Roderick Pee MD,  October 18, 2006 1:51 PM

## 2010-02-08 NOTE — Assessment & Plan Note (Signed)
Summary: pt will come in fasting/njr   Vital Signs:  Patient profile:   70 year old male Height:      70 inches Weight:      181 pounds BMI:     26.06 Temp:     98.2 degrees F oral BP sitting:   142 / 82  (left arm) Cuff size:   regular  Vitals Entered By: Kern Reap CMA (July 07, 2008 8:27 AM)  Reason for Visit cpx  History of Present Illness: William York is a 70 year old male, retired from [employer], who comes in today for evaluation of hypertension, coronary disease, hyperlipidemia, chronic, HSV, allergic rhinitis erectile dysfunction.  Hypertension is made for Toprol 50 mg daily BP 142/82. Repeat BP by me 180/90.  Pulse 50 and regular.  Patient states he is compliant with his medication.  His underlying coronary artery disease, status post bypass surgery.  These are asymptomatic.  Cardiac-wise.  Exercises daily.  He also plays golf two to 3 times per week without chest pain or shortness of breath.  His last stress test was July 2009 normal by Dr. Smitty Cords here.  Will get him set up in cardiology for a stress test there since we do not have the machine capability here anymore.  Also will have him check his BP daily.  Return in 3 days for follow-up.  Golf uses fiber 100 mg p.r.n. for erectile dysfunction.  No side effects.  Golf uses Zyrtec or TC for allergic rhinitis asymptomatic on medication.  He also takes Lipitor 20 mg nightly for hyperlipidemia.  Will check fasting lipid panel today.  He also takes an aspirin daily, and acyclovir 200 mg daily for chronic HSV infection.  Last tetanus booster 2000, pneumonia vaccines x 2, 1999, and 2006.  Colonoscopy normal 2006  Allergies (verified): No Known Drug Allergies  Past History:  Past medical, surgical, family and social histories (including risk factors) reviewed, and no changes noted (except as noted below).  Past Medical History: Reviewed history from 07/08/2007 and no changes required. Allergic rhinitis Hyperlipidemia  Hypertension erectile dysfunction Coronary artery disease CABC 99 Osteoarthritis  Past Surgical History: Reviewed history from 04/13/2007 and no changes required. CABG  Family History: Reviewed history from 07/08/2007 and no changes required. father died of Struble, vascular disease.  Mother died from old age 35 brother in good health.  No sisters son, Amber and daughter, [name]  Social History: Reviewed history from 04/23/2007 and no changes required. Retired Married Never Smoked Alcohol use-yes Drug use-no Regular exercise-yes  Review of Systems      See HPI  Physical Exam  General:  Well-developed,well-nourished,in no acute distress; alert,appropriate and cooperative throughout examination Head:  Normocephalic and atraumatic without obvious abnormalities. No apparent alopecia or balding. Eyes:  No corneal or conjunctival inflammation noted. EOMI. Perrla. Funduscopic exam benign, without hemorrhages, exudates or papilledema. Vision grossly normal. Ears:  External ear exam shows no significant lesions or deformities.  Otoscopic examination reveals clear canals, tympanic membranes are intact bilaterally without bulging, retraction, inflammation or discharge. Hearing is grossly normal bilaterally. Nose:  External nasal examination shows no deformity or inflammation. Nasal mucosa are pink and moist without lesions or exudates. Mouth:  Oral mucosa and oropharynx without lesions or exudates.  Teeth in good repair. Neck:  No deformities, masses, or tenderness noted. Chest Wall:  No deformities, masses, tenderness or gynecomastia noted. Breasts:  No masses or gynecomastia noted Lungs:  Normal respiratory effort, chest expands symmetrically. Lungs are clear to auscultation, no crackles  or wheezes. Heart:  Normal rate and regular rhythm. S1 and S2 normal without gallop, murmur, click, rub or other extra sounds. Abdomen:  Bowel sounds positive,abdomen soft and non-tender without  masses, organomegaly or hernias noted. Rectal:  No external abnormalities noted. Normal sphincter tone. No rectal masses or tenderness. Genitalia:  Testes bilaterally descended without nodularity, tenderness or masses. No scrotal masses or lesions. No penis lesions or urethral discharge. Prostate:  Prostate gland firm and smooth, no enlargement, nodularity, tenderness, mass, asymmetry or induration. Msk:  No deformity or scoliosis noted of thoracic or lumbar spine.   Pulses:  R and L carotid,radial,femoral,dorsalis pedis and posterior tibial pulses are full and equal bilaterally Extremities:  No clubbing, cyanosis, edema, or deformity noted with normal full range of motion of all joints.   Neurologic:  No cranial nerve deficits noted. Station and gait are normal. Plantar reflexes are down-going bilaterally. DTRs are symmetrical throughout. Sensory, motor and coordinative functions appear intact. Skin:  Intact without suspicious lesions or rashes Cervical Nodes:  No lymphadenopathy noted Axillary Nodes:  No palpable lymphadenopathy Inguinal Nodes:  No significant adenopathy Psych:  Cognition and judgment appear intact. Alert and cooperative with normal attention span and concentration. No apparent delusions, illusions, hallucinations   Impression & Recommendations:  Problem # 1:  CORONARY ARTERY DISEASE (ICD-414.00) Assessment Unchanged  His updated medication list for this problem includes:    Nitrostat 0.4 Mg Subl (Nitroglycerin) .Marland Kitchen... Place 1 tablet under tongue as directed    Toprol Xl 50 Mg Tb24 (Metoprolol succinate) .Marland Kitchen... Take 1 tablet by mouth once a day    Aspirin 325 Mg Tbec (Aspirin) ..... Qd  Orders: Prescription Created Electronically [phone]) Venipuncture [phone]) Cardiology Referral (Cardiology) TLB-Lipid Panel (80061-LIPID) TLB-BMP (Basic Metabolic Panel-BMET) (80048-METABOL) TLB-CBC Platelet - w/Differential (85025-CBCD) TLB-Hepatic/Liver Function Pnl (80076-HEPATIC)  TLB-TSH (Thyroid Stimulating Hormone) (84443-TSH) TLB-PSA (Prostate Specific Antigen) (84153-PSA)  Problem # 2:  HYPERCHOLESTEROLEMIA (ICD-272.0) Assessment: Improved  His updated medication list for this problem includes:    Lipitor 20 Mg Tabs (Atorvastatin calcium) .Marland Kitchen... 1 at bedtime  Orders: Prescription Created Electronically [phone]) Venipuncture [phone]) TLB-Lipid Panel (80061-LIPID) TLB-BMP (Basic Metabolic Panel-BMET) (80048-METABOL) TLB-CBC Platelet - w/Differential (85025-CBCD) TLB-Hepatic/Liver Function Pnl (80076-HEPATIC) TLB-TSH (Thyroid Stimulating Hormone) (84443-TSH) TLB-PSA (Prostate Specific Antigen) (84153-PSA)  Problem # 3:  ERECTILE DYSFUNCTION (ICD-302.72) Assessment: Improved  His updated medication list for this problem includes:    Viagra 100 Mg Tabs (Sildenafil citrate) .Marland Kitchen... Take 1 tablet by mouth as directed  Orders: Prescription Created Electronically [phone]) Venipuncture (13086) TLB-Lipid Panel (80061-LIPID) TLB-BMP (Basic Metabolic Panel-BMET) (80048-METABOL) TLB-CBC Platelet - w/Differential (85025-CBCD) TLB-Hepatic/Liver Function Pnl (80076-HEPATIC) TLB-TSH (Thyroid Stimulating Hormone) (84443-TSH) TLB-PSA (Prostate Specific Antigen) (84153-PSA)  Problem # 4:  HYPERTENSION (ICD-401.9) Assessment: Deteriorated  His updated medication list for this problem includes:    Toprol Xl 50 Mg Tb24 (Metoprolol succinate) .Marland Kitchen... Take 1 tablet by mouth once a day  Orders: Prescription Created Electronically [phone]) Venipuncture (96295) UA Dipstick w/o Micro (automated)  (81003) EKG w/ Interpretation (93000) TLB-Lipid Panel (80061-LIPID) TLB-BMP (Basic Metabolic Panel-BMET) (80048-METABOL) TLB-CBC Platelet - w/Differential (85025-CBCD) TLB-Hepatic/Liver Function Pnl (80076-HEPATIC) TLB-TSH (Thyroid Stimulating Hormone) (84443-TSH) TLB-PSA (Prostate Specific Antigen) (84153-PSA)  Problem # 5:  ALLERGIC RHINITIS (ICD-477.9) Assessment: Improved   His updated medication list for this problem includes:    Zyrtec Allergy 10 Mg Tabs (Cetirizine hcl) ..... Qd  Orders: Prescription Created Electronically [phone]) Venipuncture [phone]) TLB-Lipid Panel (80061-LIPID) TLB-BMP (Basic Metabolic Panel-BMET) (80048-METABOL) TLB-CBC Platelet - w/Differential (85025-CBCD) TLB-Hepatic/Liver Function Pnl (80076-HEPATIC) TLB-TSH (Thyroid  Stimulating Hormone) (84443-TSH) TLB-PSA (Prostate Specific Antigen) (84153-PSA)  Complete Medication List: 1)  Lipitor 20 Mg Tabs (Atorvastatin calcium) .Marland Kitchen.. 1 at bedtime 2)  Nitrostat 0.4 Mg Subl (Nitroglycerin) .... Place 1 tablet under tongue as directed 3)  Toprol Xl 50 Mg Tb24 (Metoprolol succinate) .... Take 1 tablet by mouth once a day 4)  Viagra 100 Mg Tabs (Sildenafil citrate) .... Take 1 tablet by mouth as directed 5)  Zyrtec Allergy 10 Mg Tabs (Cetirizine hcl) .... Qd 6)  Aspirin 325 Mg Tbec (Aspirin) .... Qd 7)  Acyclovir 200 Mg Caps (Acyclovir) .... Take one tab once daily  Patient Instructions: 1)  we will set you up in the cardiology office.  This year for your stress test. 2)  Please schedule a follow-up appointment in 1 year. 3)  check a BP 3 times a day starting today.  Return Friday afternoon for follow-up when you return bring all the data &  the device. Prescriptions: VIAGRA 100 MG TABS (SILDENAFIL CITRATE) Take 1 tablet by mouth as directed  #6 x 11   Entered and Authorized by:   Roderick Pee MD   Signed by:   Roderick Pee MD on 07/07/2008   Method used:   Print then Give to Patient   RxID:   [id number] NITROSTAT 0.4 MG SUBL (NITROGLYCERIN) Place 1 tablet under tongue as directed  #25 x 1   Entered and Authorized by:   Roderick Pee MD   Signed by:   Roderick Pee MD on 07/07/2008   Method used:   Print then Give to Patient   RxID:   [id number] ACYCLOVIR 200 MG  CAPS (ACYCLOVIR) take one tab once daily  #100 x 4   Entered and Authorized by:   Roderick Pee MD    Signed by:   Roderick Pee MD on 07/07/2008   Method used:   Print then Give to Patient   RxID:   [id number] TOPROL XL 50 MG TB24 (METOPROLOL SUCCINATE) Take 1 tablet by mouth once a day  #100 x 4   Entered and Authorized by:   Roderick Pee MD   Signed by:   Roderick Pee MD on 07/07/2008   Method used:   Print then Give to Patient   RxID:   [id number] LIPITOR 20 MG  TABS (ATORVASTATIN CALCIUM) 1 at bedtime  #100 x 4   Entered and Authorized by:   Roderick Pee MD   Signed by:   Roderick Pee MD on 07/07/2008   Method used:   Print then Give to Patient   RxID:   [id number]    Immunization History:  Pneumovax Immunization History:    Pneumovax:  historical (01/09/1997)    Pneumovax:  historical (07/01/2004)  Tetanus/Td Immunization History:    Tetanus/Td:  historical (06/26/2006)     Preventive Care Screening  Last Tetanus Booster:    Date:  06/26/2006    Results:  Historical   Colonoscopy:    Date:  07/15/2004    Results:  normal   Last Pneumovax:    Date:  01/09/1997    Results:  Historical   Laboratory Results   Urine Tests    Routine Urinalysis   Color: yellow Appearance: Clear Glucose: negative   (Normal Range: Negative) Bilirubin: negative   (Normal Range: Negative) Ketone: negative   (Normal Range: Negative) Spec. Gravity: 1.020   (Normal Range: 1.003-1.035) Blood: negative   (Normal Range: Negative) pH: 5.5   (  Normal Range: 5.0-8.0) Protein: negative   (Normal Range: Negative) Urobilinogen: 0.2   (Normal Range: 0-1) Nitrite: negative   (Normal Range: Negative) Leukocyte Esterace: negative   (Normal Range: Negative)    Comments: Joanne Chars CMA  July 07, 2008 2:31 PM

## 2010-02-08 NOTE — Progress Notes (Signed)
Summary: Nuclear Pre-Procedure   Phone Note Outgoing Call Call back at Home Phone [phone]   Call placed by: Stanton Kidney, EMT-P,  July 16, 2008 1:49 PM Call placed to: Patient Summary of Call: Reviewed information on [employer] Information Sheet (see scanned document for further details).  Spoke with Pt.    Nuclear Med Background Indications for Stress Test: Evaluation for Ischemia   History: CABG, Myocardial Perfusion Study  History Comments: '99 CABG '03 MPS     Nuclear Pre-Procedure Cardiac Risk Factors: Hypertension, Lipids Height (in): 70

## 2010-02-08 NOTE — Progress Notes (Signed)
Summary: Nuc. Pre-Procedure  Phone Note Outgoing Call Call back at Home Phone [phone]   Call placed by: Irean Hong, RN,  July 19, 2009 3:39 PM Summary of Call: Reviewed information on [employer] Information Sheet (see scanned document for further details).  Spoke with patient's wife.     Nuclear Med Background Indications for Stress Test: Evaluation for Ischemia, Graft Patency   History: CABG, Myocardial Perfusion Study  History Comments: '99 CABG  7/10 MPS: Abnormal septal motion, (-) ischemia, EF=55%, Low risk.  Symptoms: Chest Pain, Chest Tightness    Nuclear Pre-Procedure Cardiac Risk Factors: Hypertension, Lipids Height (in): 70  Nuclear Med Study Referring MD:  J.Todd

## 2010-02-08 NOTE — Assessment & Plan Note (Signed)
Summary: 11mo rov/mm pt rsc/njr   Vital Signs:  Patient profile:   69 year old male Weight:      183 pounds Temp:     98.1 degrees F oral BP sitting:   110 / 74  (left arm) Cuff size:   regular  Vitals Entered By: William York CMA Duncan Dull) (September 17, 2008 10:27 AM)  Reason for Visit follow up bp  History of Present Illness: William York is a 70 year old male, who comes in today for follow-up of hypertension.  He is currently on 50 mg of Cozaar daily 50%.  His blood pressures are normal.  The other 50% are elevated.  BP by me today.  Right and left arm sitting position 150/80.  Allergies: No Known Drug Allergies  Past History:  Past medical, surgical, family and social histories (including risk factors) reviewed, and no changes noted (except as noted below).  Past Medical History: Reviewed history from 07/08/2007 and no changes required. Allergic rhinitis Hyperlipidemia Hypertension erectile dysfunction Coronary artery disease CABC 99 Osteoarthritis  Past Surgical History: Reviewed history from 04/13/2007 and no changes required. CABG  Family History: Reviewed history from 07/08/2007 and no changes required. father died of Struble, vascular disease.  Mother died from old age 8 brother in good health.  No sisters son, Karlo and daughter, [name]  Social History: Reviewed history from 04/23/2007 and no changes required. Retired Married Never Smoked Alcohol use-yes Drug use-no Regular exercise-yes  Review of Systems      See HPI  Physical Exam  General:  Well-developed,well-nourished,in no acute distress; alert,appropriate and cooperative throughout examination Heart:  150/80, right and left arm sitting position   Impression & Recommendations:  Problem # 1:  HYPERTENSION (ICD-401.9) Assessment Improved  The following medications were removed from the medication list:    Cozaar 25 Mg Tabs (Losartan potassium) .Marland Kitchen... Take 1 tablet by mouth every morning His  updated medication list for this problem includes:    Toprol Xl 50 Mg Tb24 (Metoprolol succinate) .Marland Kitchen... Take 1 tablet by mouth once a day    Cozaar 50 Mg Tabs (Losartan potassium) .Marland Kitchen... 1 and 1/2 qam  Orders: Prescription Created Electronically [phone])  Complete Medication List: 1)  Lipitor 20 Mg Tabs (Atorvastatin calcium) .Marland Kitchen.. 1 at bedtime 2)  Nitrostat 0.4 Mg Subl (Nitroglycerin) .... Place 1 tablet under tongue as directed 3)  Toprol Xl 50 Mg Tb24 (Metoprolol succinate) .... Take 1 tablet by mouth once a day 4)  Viagra 100 Mg Tabs (Sildenafil citrate) .... Take 1 tablet by mouth as directed 5)  Zyrtec Allergy 10 Mg Tabs (Cetirizine hcl) .... Qd 6)  Aspirin 325 Mg Tbec (Aspirin) .... Qd 7)  Acyclovir 200 Mg Caps (Acyclovir) .... Take one tab once daily 8)  Cozaar 50 Mg Tabs (Losartan potassium) .Marland Kitchen.. 1 and 1/2 qam  Patient Instructions: 1)  increase the Cozaar to 75 mg daily.  Check a morning blood pressure daily.  If after 4 weeks. y r blood pressures are  normal, then continue that dose.  If after 4 weeks if blood pressures are not all normal, then increase the Cozaar to 100 mg daily. Prescriptions: COZAAR 50 MG TABS (LOSARTAN POTASSIUM) 1 and 1/2 qam  #150 x 3   Entered and Authorized by:   Roderick Pee MD   Signed by:   Roderick Pee MD on 09/17/2008   Method used:   Print then Give to Patient   RxID:   [id number]

## 2010-02-08 NOTE — Procedures (Signed)
Summary: Gastroenterology colon  Gastroenterology colon   Imported By: Donneta Romberg 04/13/2007 13:58:43  _____________________________________________________________________  External Attachment:    Type:   Image     Comment:   External Document

## 2010-02-08 NOTE — Assessment & Plan Note (Signed)
Summary: ABD PAIN/NJR   Vital Signs:  Patient Profile:   70 Years Old Male Weight:      180 pounds (81.82 kg) Temp:     98.2 degrees F (36.78 degrees C) oral BP sitting:   158 / 78  (right arm)  Pt. in pain?   yes    Location:   abdomen    Intensity:   2-8    Type:       dull  Vitals Entered By: Arcola Jansky, RN (October 15, 2006 11:08 AM)                  Chief Complaint:  thurs hs  and "bad pain in stomach" sat "it would come and go" "black stool sat".  History of Present Illness: William York is a 70 year old male, who comes in today for evaluation of abdominal pain.  On October the second around 10 p.m. he noticed the gradual onset of right and left upper quadrant abdominal pain.  He described as a dull ache.  It was constant.  He had decreased appetite, but no fever, nausea, vomiting, or diarrhea.  The next day felt the same.  I took some Pepto-Bismol.  In October the fourth the following day.  He had a black bowel movement.  He is pain reason he was an 8 and, now it's two.  Region.  The pain was constant now comes and goes.  His nitroglycerin was stomach in the past.  Family history negative.  Review of systems otherwise negative.  Acute Visit History:      The patient complains of abdominal pain.  He denies chest pain, constipation, cough, diarrhea, earache, eye symptoms, fever, genitourinary symptoms, headache, musculoskeletal symptoms, nasal discharge, nausea, rash, sinus problems, sore throat, and vomiting.         Current Allergies (reviewed today): No known allergies   Past Medical History:    Reviewed history from 10/04/2006 and no changes required:       Allergic rhinitis       Hyperlipidemia       Hypertension       erectile dysfunction   Family History:    Reviewed history and no changes required:  Social History:    Reviewed history and no changes required:   Risk Factors:  Tobacco use:  never Alcohol use:  no Exercise:  yes   Review of  Systems      See HPI   Physical Exam  General:     Well-developed,well-nourished,in no acute distress; alert,appropriate and cooperative throughout examination Abdomen:     Bowel sounds positive,abdomen soft and non-tender without masses, organomegaly or hernias noted. Rectal:     No external abnormalities noted. Normal sphincter tone. No rectal masses or tenderness.guaiac-negative Prostate:     Prostate gland firm and smooth, no enlargement, nodularity, tenderness, mass, asymmetry or induration.    Impression & Recommendations:  Problem # 1:  EPIGASTRIC PAIN (ICD-789.06) Assessment: New  Orders: Hgb (40347) Radiology Referral (Radiology)   Complete Medication List: 1)  Lipitor 20 Mg Tabs (Atorvastatin calcium) .Marland Kitchen.. 1 at bedtime 2)  Nitrostat 0.4 Mg Subl (Nitroglycerin) .... Place 1 tablet under tongue as directed 3)  Toprol Xl 50 Mg Tb24 (Metoprolol succinate) .... Take 1 tablet by mouth once a day 4)  Valtrex 1 Gm Tabs (Valacyclovir hcl) .... Take 1/2 tablet by mouth once a day 5)  Viagra 100 Mg Tabs (Sildenafil citrate) .... Take 1 tablet by mouth as directed 6)  Zyrtec  Allergy 10 Mg Tabs (Cetirizine hcl) .... Qd 7)  Aspirin 325 Mg Tbec (Aspirin) .... Qd   Patient Instructions: 1)  examination is normal and your hemoglobin is normal.  I think a black bowel was related to Pepto-Bismol.  I want to stay in a fat free diet and would be to set her for an ultrasound of her gallbladder to rule out gallbladder disease.    ]

## 2010-02-08 NOTE — Progress Notes (Signed)
  Phone Note Outgoing Call   Summary of Call: a call [name] to let him know that his cardiac stress test was normal Initial call taken by: Roderick Pee MD,  July 22, 2009 8:32 AM

## 2010-02-08 NOTE — Miscellaneous (Signed)
Summary: Consent for Exercise ECG Test  Consent for Exercise ECG Test   Imported By: Maryln Gottron 07/25/2007 14:58:32  _____________________________________________________________________  External Attachment:    Type:   Image     Comment:   External Document

## 2010-02-08 NOTE — Assessment & Plan Note (Signed)
Summary: Cardiology Nuclear Study  Nuclear Med Background Indications for Stress Test: Evaluation for Ischemia   History: CABG, Myocardial Perfusion Study  History Comments: '99 CABG '03 MPS     Nuclear Pre-Procedure Cardiac Risk Factors: Hypertension, Lipids, Overweight Caffeine/Decaff Intake: none NPO After: 6:30 AM Lungs: clear IV 0.9% NS with Angio Cath: 22g     IV Site: (R) AC IV Started by: Irean Hong RN Chest Size (in) 42     Height (in): 70 Weight (lb): 179 BMI: 25.78  Nuclear Med Study 1 or 2 day study:  1 day     Stress Test Type:  Stress Reading MD:  Charlton Haws, MD     Referring MD:  J.Todd Resting Radionuclide:  Technetium 40m Tetrofosmin     Resting Radionuclide Dose:  11 mCi  Stress Radionuclide:  Technetium 77m Tetrofosmin     Stress Radionuclide Dose:  33 mCi   Stress Protocol Exercise Time (min):  10:05 min     Max HR:  134 bpm     Predicted Max HR: 153 bpm  Max Systolic BP: 237 mm Hg     % Max HR:  88 %     METS: 11.8 Rate Pressure Product:  16109    Stress Test Technologist:  Milana Na EMT-P     Nuclear Technologist:  Domenic Polite CNMT  Rest Procedure  Myocardial perfusion imaging was performed at rest 45 minutes following the intraveneous administration of Myoview Technetium 55m Tetrofosmin.  Stress Procedure  The patient exercised for 10:05.  The patient stopped due to fatigue and denied any chest pain.  There were no significant ST-T wave changes, and occ pvcs.  Myoview was injected at peak exercise and myocardial perfusion imaging was performed after a brief delay.  QPS Raw Data Images:  Normal; no motion artifact; normal heart/lung ratio. Stress Images:  NI: Uniform and normal uptake of tracer in all myocardial segments. Rest Images:  Normal homogeneous uptake in all areas of the myocardium. Subtraction (SDS):  Normal Transient Ischemic Dilatation:  1.04  (Normal <1.22)  Lung/Heart Ratio:  .33  (Normal <0.45)  Quantitative  Gated Spect Images QGS EDV:  125 ml QGS ESV:  57 ml QGS EF:  55 % QGS cine images:  Abnormal septal motion  Findings Low risk nuclear study      Overall Impression  Exercise Capacity: Good exercise capacity. BP Response: Normal blood pressure response. Clinical Symptoms: No chest pain ECG Impression: No significant ST segment change suggestive of ischemia. Overall Impression: Low risk stress nuclear study. Overall Impression Comments: Small apical defect not thought to be clinically significant Likely axis of rotation artifact. No ischemia

## 2010-02-08 NOTE — Assessment & Plan Note (Signed)
Summary: flu shot/njr  Nurse Visit    Prior Medications: LIPITOR 20 MG  TABS (ATORVASTATIN CALCIUM) 1 at bedtime NITROSTAT 0.4 MG SUBL (NITROGLYCERIN) Place 1 tablet under tongue as directed TOPROL XL 50 MG TB24 (METOPROLOL SUCCINATE) Take 1 tablet by mouth once a day VALTREX 1 GM TABS (VALACYCLOVIR HCL) Take 1/2 tablet by mouth once a day VIAGRA 100 MG TABS (SILDENAFIL CITRATE) Take 1 tablet by mouth as directed ZYRTEC ALLERGY 10 MG  TABS (CETIRIZINE HCL) qd ASPIRIN 325 MG  TBEC (ASPIRIN) qd Current Allergies: No known allergies    Influenza Vaccine    Vaccine Type: Fluvax Non-MCR    Given by: Alfred Levins, CMA  Flu Vaccine Consent Questions    Do you have a history of severe allergic reactions to this vaccine? no    Any prior history of allergic reactions to egg and/or gelatin? no    Do you have a sensitivity to the preservative Thimersol? no    Do you have a past history of Guillan-Barre Syndrome? no    Do you currently have an acute febrile illness? no    Have you ever had a severe reaction to latex? no    Vaccine information given and explained to patient? yes   Impression & Recommendations:  lot [id number], EXP 30 jun 09, sanofi pasteur left deltoid IM, 0.5 cc.  Complete Medication List: 1)  Lipitor 20 Mg Tabs (Atorvastatin calcium) .Marland Kitchen.. 1 at bedtime 2)  Nitrostat 0.4 Mg Subl (Nitroglycerin) .... Place 1 tablet under tongue as directed 3)  Toprol Xl 50 Mg Tb24 (Metoprolol succinate) .... Take 1 tablet by mouth once a day 4)  Valtrex 1 Gm Tabs (Valacyclovir hcl) .... Take 1/2 tablet by mouth once a day 5)  Viagra 100 Mg Tabs (Sildenafil citrate) .... Take 1 tablet by mouth as directed 6)  Zyrtec Allergy 10 Mg Tabs (Cetirizine hcl) .... Qd 7)  Aspirin 325 Mg Tbec (Aspirin) .... Qd   Orders Added: 1)  Influenza Vaccine NON MCR [00028]    ]

## 2010-02-08 NOTE — Assessment & Plan Note (Signed)
Summary: Cardiology Nuclear Study  Nuclear Med Background Indications for Stress Test: Graft Patency   History: CABG, Myocardial Perfusion Study  History Comments: '99 CABG  7/10 MPS: Abnormal septal motion, (-) ischemia, EF=55%, Low risk.  Symptoms: Chest Pain, Chest Tightness    Nuclear Pre-Procedure Cardiac Risk Factors: Hypertension, Lipids Caffeine/Decaff Intake: None NPO After: 5:00 AM Lungs: clear IV 0.9% NS with Angio Cath: 20g     IV Site: (R) AC IV Started by: Irean Hong RN Chest Size (in) 42     Height (in): 70 Weight (lb): 175 BMI: 25.20 Tech Comments: Held toprol 24 hrs.  Nuclear Med Study 1 or 2 day study:  There      Stress Test Type:  Stress Reading MD:  Willa Rough, MD     Referring MD:  J.Todd Resting Radionuclide:  Technetium 48m Tetrofosmin     Resting Radionuclide Dose:  11 mCi  Stress Radionuclide:  Technetium 7m Tetrofosmin     Stress Radionuclide Dose:  33 mCi   Stress Protocol Exercise Time (min):  11:00 min     Max HR:  131 bpm     Predicted Max HR:  152 bpm  Max Systolic BP: 231 mm Hg     Percent Max HR:  86.18 %     METS: 13.3 Rate Pressure Product:  16109    Stress Test Technologist:  Milana Na EMT-P     Nuclear Technologist:  Domenic Polite CNMT  Rest Procedure  Myocardial perfusion imaging was performed at rest 45 minutes following the intravenous administration of Myoview Technetium 76m Tetrofosmin.  Stress Procedure  The patient exercised for 11:00. The patient stopped due to fatigue and denied any chest pain.  There were no significant ST-T wave changes.  Myoview was injected at peak exercise and myocardial perfusion imaging was performed after a brief delay.  QPS Raw Data Images:  Patient motion noted; appropriate software correction applied. Stress Images:  There is mild decrease in activity at the apical cap Rest Images:  Normal homogeneous uptake in all areas of the myocardium. Subtraction (SDS):  No evidence of  ischemia. Transient Ischemic Dilatation:  .86  (Normal <1.22)  Lung/Heart Ratio:  .32  (Normal <0.45)  Quantitative Gated Spect Images QGS EDV:  99 ml QGS ESV:  43 ml QGS EF:  56 % QGS cine images:  Good motion  Findings Low risk nuclear study      Overall Impression  Exercise Capacity: Good exercise capacity. BP Response: Normal blood pressure response. Clinical Symptoms: No chest pain ECG Impression: No significant ST segment change suggestive of ischemia. Overall Impression Comments: This study is similar to the study of 07/2008. At that time there was decreased activity in the apical cap. This is seen now. I doubt significant ischemia.

## 2010-02-08 NOTE — Progress Notes (Signed)
Summary: regarding life ins policy chart requested  Medications Added LIPITOR 10 MG TABS (ATORVASTATIN CALCIUM) Take 1 tablet by mouth every night NITROSTAT 0.4 MG SUBL (NITROGLYCERIN) Place 1 tablet under tongue as directed TOPROL XL 50 MG TB24 (METOPROLOL SUCCINATE) Take 1 tablet by mouth once a day VALTREX 1 GM TABS (VALACYCLOVIR HCL) Take 1/2 tablet by mouth once a day VIAGRA 100 MG TABS (SILDENAFIL CITRATE) Take 1 tablet by mouth as directed       Phone Note Call from Patient Call back at Home Phone [phone]   Caller: Patient Call For: dr todd Summary of Call: pt would like dr todd to call regarding stress test 2005 and 2006 for life ins policy Initial call taken by: Heron Sabins,  September 17, 2006 2:08 PM  Follow-up for Phone Call        Call to patient that only stress tests are from 07 and 08.  He says that he's had a stress test every year since his CABG 1999.  He needs all these reports with the doctor's statement of findings each year.  07 & 08 stress tests have been sent, but no statement of results.  Needs all this for insurance reasons.  Will send all to Dr. Tawanna Cooler to review.   Follow-up by: Rudy Jew, RN,  September 17, 2006 4:34 PM  Additional Follow-up for Phone Call Additional follow up Details #1::        please patient we did deal stress test Stowers time I understand why all stress test would have anything to do with insurance    New/Updated Medications: LIPITOR 10 MG TABS (ATORVASTATIN CALCIUM) Take 1 tablet by mouth every night NITROSTAT 0.4 MG SUBL (NITROGLYCERIN) Place 1 tablet under tongue as directed TOPROL XL 50 MG TB24 (METOPROLOL SUCCINATE) Take 1 tablet by mouth once a day VALTREX 1 GM TABS (VALACYCLOVIR HCL) Take 1/2 tablet by mouth once a day VIAGRA 100 MG TABS (SILDENAFIL CITRATE) Take 1 tablet by mouth as directed  I called the insurance company and found out exactly what they needed.  Then I gave to [employer] to send them  the requested info./mae

## 2010-02-08 NOTE — Progress Notes (Signed)
Summary: H1N1 question  Phone Note Call from Patient Call back at Home Phone [phone]   Caller: Patient Call For: Roderick Pee MD Summary of Call: Pt has the opportunity to get the H1N1 vaccine and wants Dr Barbette Or input before he get's it. OK to leav message on home phone Initial call taken by: Sid Falcon LPN,  December 23, 2007 2:51 PM  Follow-up for Phone Call        get it !!!!!!!!!!!!!!!!!!!!! Follow-up by: Roderick Pee MD,  December 23, 2007 2:58 PM  Additional Follow-up for Phone Call Additional follow up Details #1::        Pt informed Additional Follow-up by: Sid Falcon LPN,  December 23, 2007 3:01 PM

## 2010-03-29 ENCOUNTER — Other Ambulatory Visit: Payer: Self-pay | Admitting: Dermatology

## 2010-05-24 NOTE — Assessment & Plan Note (Signed)
[facility]                         GASTROENTEROLOGY OFFICE NOTE   William York, William York                       MRN:          [id number]  DATE:08/20/2006                            DOB:          [birth year]    William York is a very nice 70 year old white male, patient of Dr. Tawanna Cooler,  who is here today for evaluation of hiccups. For past 6 months while  eating, he develops about 3 or 4 hiccups which subside spontaneously. We  saw him for the same problem in 2005. Barium swallow then showed  question of a Zenker's diverticulum, normal peristalsis, a patulous  lower esophageal sphincter, but the barium tablet went through without  any delay. He was put on Protonix 40 mg a day with prompt relief of all  his symptoms for 3 years until last few months. We also saw William York in  the past for screening colonoscopy and rectal bleeding. Exam in July  2006 showed internal hemorrhoids. Next colonoscopy recall would be in 10  years.   PHYSICAL EXAMINATION:  Blood pressure 130/74, pulse 64, and weight 181  pounds. Patient was not examined.   IMPRESSION:  A 70 year old gentleman with intermittent hiccups, most  likely related to a lower esophageal sphincter dysfunction spasm or  gastroesophageal reflux. He denies any dysphagia or odynophagia.   PLAN:  I have given 2 options, 1 is to treat him empirically with  Protonix 40 mg a day for a month or two.The  other option would be  proceed with upper endoscopy and passage of a dilator. Patient elected  the first option with the understanding that if the hiccups return that  we will proceed with upper endoscopy and mostly likely dilation.     William Morton. Juanda Chance, MD  Electronically Signed    DMB/MedQ  DD: 08/20/2006  DT: 08/20/2006  Job #: [id number]   cc:   Tinnie Gens A. Tawanna Cooler, MD

## 2010-05-27 NOTE — Op Note (Signed)
William York, William York                ACCOUNT NO.:  [id number]   MEDICAL RECORD NO.:  [id number]          PATIENT TYPE:  AMB   LOCATION:  DSC                          FACILITY:  [facility]   PHYSICIAN:  Currie Paris, M.D.DATE OF BIRTH:  04-16-1940   DATE OF PROCEDURE:  05/19/2004  DATE OF DISCHARGE:                                 OPERATIVE REPORT   CCS [phone]   PREOPERATIVE DIAGNOSIS:  Right inguinal hernia.   POSTOPERATIVE DIAGNOSIS:  Right inguinal hernia--indirect.   OPERATION PERFORMED:  Repair of right inguinal hernia.   SURGEON:  Currie Paris, M.D.   ANESTHESIA:  MAC.   INDICATIONS FOR PROCEDURE:  This is a 70 year old with a right inguinal  hernia that he wished to have repaired.   DESCRIPTION OF PROCEDURE:  The patient was seen in the holding area and had  no further questions.  He had already initialed the right inguinal area as  the operative site and I confirmed that and initialed it also.  The patient  was taken to the operating room and given intravenous sedation. The groin  area was clipped, prepped and draped.  The time out occurred.   I infiltrated with a combination of 1% Xylocaine with epinephrine and 0.5%  plain Marcaine along the incision line and then below the fascia at the  anterior superior iliac spine.  The incision was made, deepened to the  external oblique aponeurosis with additional local infiltrated as needed.  The aponeurosis was opened in the line of its fibers, elevated off of the  underlying structures and the cord dissected off the inguinal floor.  It was  surrounded with a Penrose drain.  The inguinal floor appeared somewhat thin  but intact.   The cord was opened and the indirect sac identified, stripped off and  ligated at the neck with a suture ligation of 2-0 silk and a tie of 2-0  silk.  It retracted under the deep ring.  I cleaned off the floor so that we  could lay a piece of mesh on it and onlay graft was placed which was  split  laterally to go around the cord.  It was sutured in with a running 2-0  Prolene from medial to lateral along the inferior aspect of the area and  then tacked medially to the internal oblique.  The tails were crossed and  went well laterally, leaving plenty of room for the cord to come through the  mesh.   Everything appeared to be dry.  Additional local had been infiltrated to  help with postoperative analgesia.  The incision was closed with a running 3-  0 Vicryl to close the external oblique, running 3-0 Vicryl on Scarpa's,  running 4-0 Monocryl subcuticular, plus Dermabond.  The patient tolerated  the procedure well.  There were no operative complications.  All counts were  correct.      CJS/MEDQ  D:  05/19/2004  T:  05/19/2004  Job:  [id number]   cc:   Tinnie Gens A. Tawanna Cooler, M.D. [facility]

## 2010-07-21 ENCOUNTER — Telehealth: Payer: Self-pay | Admitting: Family Medicine

## 2010-07-21 ENCOUNTER — Other Ambulatory Visit: Payer: Self-pay | Admitting: Family Medicine

## 2010-07-21 ENCOUNTER — Encounter: Payer: Self-pay | Admitting: Family Medicine

## 2010-07-21 ENCOUNTER — Ambulatory Visit (INDEPENDENT_AMBULATORY_CARE_PROVIDER_SITE_OTHER): Payer: Medicare Other | Admitting: Family Medicine

## 2010-07-21 DIAGNOSIS — B009 Herpesviral infection, unspecified: Secondary | ICD-10-CM

## 2010-07-21 DIAGNOSIS — I10 Essential (primary) hypertension: Secondary | ICD-10-CM

## 2010-07-21 DIAGNOSIS — Z23 Encounter for immunization: Secondary | ICD-10-CM

## 2010-07-21 DIAGNOSIS — E7800 Pure hypercholesterolemia, unspecified: Secondary | ICD-10-CM

## 2010-07-21 DIAGNOSIS — Z0000 Encounter for general adult medical examination without abnormal findings: Secondary | ICD-10-CM

## 2010-07-21 DIAGNOSIS — I2510 Atherosclerotic heart disease of native coronary artery without angina pectoris: Secondary | ICD-10-CM

## 2010-07-21 DIAGNOSIS — F528 Other sexual dysfunction not due to a substance or known physiological condition: Secondary | ICD-10-CM

## 2010-07-21 DIAGNOSIS — Z125 Encounter for screening for malignant neoplasm of prostate: Secondary | ICD-10-CM

## 2010-07-21 LAB — POCT URINALYSIS DIPSTICK
Bilirubin, UA: NEGATIVE
Blood, UA: NEGATIVE
Glucose, UA: NEGATIVE
Ketones, UA: NEGATIVE
Leukocytes, UA: NEGATIVE
Nitrite, UA: NEGATIVE
Protein, UA: NEGATIVE
Spec Grav, UA: 1.025
Urobilinogen, UA: 0.2
pH, UA: 6

## 2010-07-21 LAB — CBC WITH DIFFERENTIAL/PLATELET
Basophils Absolute: 0 10*3/uL (ref 0.0–0.1)
Basophils Relative: 0.4 % (ref 0.0–3.0)
Eosinophils Absolute: 0.1 10*3/uL (ref 0.0–0.7)
Eosinophils Relative: 1.2 % (ref 0.0–5.0)
HCT: 43.3 % (ref 39.0–52.0)
Hemoglobin: 14.7 g/dL (ref 13.0–17.0)
Lymphocytes Relative: 25.4 % (ref 12.0–46.0)
Lymphs Abs: 1.3 10*3/uL (ref 0.7–4.0)
MCHC: 33.9 g/dL (ref 30.0–36.0)
MCV: 95.2 fl (ref 78.0–100.0)
Monocytes Absolute: 0.5 10*3/uL (ref 0.1–1.0)
Monocytes Relative: 9.4 % (ref 3.0–12.0)
Neutro Abs: 3.3 10*3/uL (ref 1.4–7.7)
Neutrophils Relative %: 63.6 % (ref 43.0–77.0)
Platelets: 167 10*3/uL (ref 150.0–400.0)
RBC: 4.54 Mil/uL (ref 4.22–5.81)
RDW: 12.7 % (ref 11.5–14.6)
WBC: 5.2 10*3/uL (ref 4.5–10.5)

## 2010-07-21 LAB — LIPID PANEL
Cholesterol: 150 mg/dL (ref 0–200)
HDL: 65.3 mg/dL (ref >39.00)
LDL Cholesterol: 74 mg/dL (ref 0–99)
Total CHOL/HDL Ratio: 2
Triglycerides: 55 mg/dL (ref 0.0–149.0)
VLDL: 11 mg/dL (ref 0.0–40.0)

## 2010-07-21 LAB — BASIC METABOLIC PANEL
BUN: 25 mg/dL — ABNORMAL HIGH (ref 6–23)
CO2: 28 mEq/L (ref 19–32)
Calcium: 9.1 mg/dL (ref 8.4–10.5)
Chloride: 104 mEq/L (ref 96–112)
Creatinine, Ser: 0.8 mg/dL (ref 0.4–1.5)
GFR: 109.6 mL/min (ref >60.00)
Glucose, Bld: 95 mg/dL (ref 70–99)
Potassium: 4.7 mEq/L (ref 3.5–5.1)
Sodium: 141 mEq/L (ref 135–145)

## 2010-07-21 LAB — HEPATIC FUNCTION PANEL
ALT: 32 U/L (ref 0–53)
AST: 31 U/L (ref 0–37)
Albumin: 4.4 g/dL (ref 3.5–5.2)
Alkaline Phosphatase: 64 U/L (ref 39–117)
Bilirubin, Direct: 0.1 mg/dL (ref 0.0–0.3)
Total Bilirubin: 0.8 mg/dL (ref 0.3–1.2)
Total Protein: 6.7 g/dL (ref 6.0–8.3)

## 2010-07-21 LAB — TSH: TSH: 0.78 u[IU]/mL (ref 0.35–5.50)

## 2010-07-21 LAB — PSA: PSA: 1.03 ng/mL (ref 0.10–4.00)

## 2010-07-21 MED ORDER — METOPROLOL SUCCINATE ER 50 MG PO TB24: 50.0000 mg | ORAL_TABLET | Freq: Every day | ORAL | Status: DC

## 2010-07-21 MED ORDER — ATORVASTATIN CALCIUM 20 MG PO TABS: 20.0000 mg | ORAL_TABLET | Freq: Every day | ORAL | Status: DC

## 2010-07-21 MED ORDER — LOSARTAN POTASSIUM 50 MG PO TABS: 50.0000 mg | ORAL_TABLET | Freq: Every day | ORAL | Status: DC

## 2010-07-21 MED ORDER — ACYCLOVIR 200 MG PO CAPS: 200.0000 mg | ORAL_CAPSULE | ORAL | Status: DC

## 2010-07-21 MED ORDER — SILDENAFIL CITRATE 100 MG PO TABS: 100.0000 mg | ORAL_TABLET | Freq: Every day | ORAL | Status: DC | PRN

## 2010-07-21 MED ORDER — NITROGLYCERIN 0.4 MG SL SUBL: 0.4000 mg | SUBLINGUAL_TABLET | SUBLINGUAL | Status: DC | PRN

## 2010-07-21 NOTE — Patient Instructions (Signed)
Continue her current medications in good health habits.  Return one year or sooner if any problem

## 2010-07-21 NOTE — Progress Notes (Signed)
Subjective:    Patient ID: William York, male    DOB: May 28, 1940, 70 y.o.   MRN: [id number]  HPI   William York is a 70 year old, married male, nonsmoker, who comes in today for Medicare wellness examination because of a history of hypertension, hyperlipidemia, coronary artery disease,,,,,,,,, bypass surgery 13 years ago,,,,,,, mild erectile dysfunction, and occasional HSV.  He takes acyclovir p.r.n. For outbreak.  He takes Cozaar 75 mg daily for hypertension, along with Toprol 50 mg daily, an 81-mg aspirin tablet.  Cardiac-wise he is asymptomatic.  He has no chest pain.  He exercises on a regular basis plays golf or 4 days per week.  He takes Lipitor 20 mg nightly for hyperlipidemia.  Will check lipid panel.  Uses V  100 mg p.r.n.  He gets routine eye care, dental care, hearing normal, activities of daily living.  He exercises on a regular basis as noted above, colonoscopy, recent normal, tetanus, 2008, Pneumovax, x 2, shingles.  Information given.  He does all his own financial work independently.  Home health safety reviewed.  No issues identified.  No guns in the house.  He does have a healthcare power of attorney and living will.    Review of Systems  Constitutional: Negative.   HENT: Negative.   Eyes: Negative.   Respiratory: Negative.   Cardiovascular: Negative.   Gastrointestinal: Negative.   Genitourinary: Negative.   Musculoskeletal: Negative.   Skin: Negative.   Neurological: Negative.   Hematological: Negative.   Psychiatric/Behavioral: Negative.        Objective:   Physical Exam  Constitutional: He is oriented to person, place, and time. He appears well-developed and well-nourished.  HENT:  Head: Normocephalic and atraumatic.  Right Ear: External ear normal.  Left Ear: External ear normal.  Nose: Nose normal.  Mouth/Throat: Oropharynx is clear and moist.  Eyes: Conjunctivae and EOM are normal. Pupils are equal, round, and reactive to light.  Neck: Normal range of  motion. Neck supple. No JVD present. No tracheal deviation present. No thyromegaly present.  Cardiovascular: Normal rate, regular rhythm, normal heart sounds and intact distal pulses.  Exam reveals no gallop and no friction rub.   No murmur heard. Pulmonary/Chest: Effort normal and breath sounds normal. No stridor. No respiratory distress. He has no wheezes. He has no rales. He exhibits no tenderness.  Abdominal: Soft. Bowel sounds are normal. He exhibits no distension and no mass. There is no tenderness. There is no rebound and no guarding.  Genitourinary: Rectum normal, prostate normal and penis normal. Guaiac negative stool. No penile tenderness.  Musculoskeletal: Normal range of motion. He exhibits no edema and no tenderness.  Lymphadenopathy:    He has no cervical adenopathy.  Neurological: He is alert and oriented to person, place, and time. He has normal reflexes. No cranial nerve deficit. He exhibits normal muscle tone.  Skin: Skin is warm and dry. No rash noted. No erythema. No pallor.       Spine the bridge of the nose.  He had a basal cell carcinoma removed by Dr. Mayford Knife  Psychiatric: He has a normal mood and affect. His behavior is normal. Judgment and thought content normal.          Assessment & Plan:  Healthy male  Coronary disease, asymptomatic stress test for confirmation.  HSV.  Continue acyclovir p.r.n.  Hypertension.  Continue Cozaar and Toprol.  Hyperlipidemia.  Continue Lipitor and aspirin.  Erectile dysfunction Viagra p.r.n.  Return in one year, sooner if any problems

## 2010-07-21 NOTE — Telephone Encounter (Signed)
Pt called and said that 4 of his scripts, Losartan, Brand Name Lipitor, Acyclovir, Metroprolol, needed to be sent to [facility], not [facility]. The other 2 meds are [facility].

## 2010-07-22 ENCOUNTER — Telehealth: Payer: Self-pay | Admitting: *Deleted

## 2010-07-22 MED ORDER — LOSARTAN POTASSIUM 50 MG PO TABS: ORAL_TABLET | ORAL | Status: DC

## 2010-07-22 NOTE — Telephone Encounter (Signed)
rx clarification

## 2010-07-22 NOTE — Progress Notes (Signed)
Left message on machine for patient

## 2010-07-27 ENCOUNTER — Ambulatory Visit (HOSPITAL_COMMUNITY): Payer: Medicare Other | Attending: Family Medicine | Admitting: Radiology

## 2010-07-27 ENCOUNTER — Telehealth: Payer: Self-pay

## 2010-07-27 VITALS — Ht 70.0 in | Wt 175.0 lb

## 2010-07-27 DIAGNOSIS — Z951 Presence of aortocoronary bypass graft: Secondary | ICD-10-CM | POA: Insufficient documentation

## 2010-07-27 DIAGNOSIS — I25810 Atherosclerosis of coronary artery bypass graft(s) without angina pectoris: Secondary | ICD-10-CM

## 2010-07-27 DIAGNOSIS — I2510 Atherosclerotic heart disease of native coronary artery without angina pectoris: Secondary | ICD-10-CM

## 2010-07-27 MED ORDER — TECHNETIUM TC 99M TETROFOSMIN IV KIT
11.0000 | PACK | Freq: Once | INTRAVENOUS | Status: AC | PRN
  Administered 2010-07-27: 11 via INTRAVENOUS

## 2010-07-27 MED ORDER — TECHNETIUM TC 99M TETROFOSMIN IV KIT
33.0000 | PACK | Freq: Once | INTRAVENOUS | Status: AC | PRN
  Administered 2010-07-27: 33 via INTRAVENOUS

## 2010-07-27 NOTE — Progress Notes (Signed)
[facility] 3 NUCLEAR MED [address] [place] [address] [phone]  Cardiology Nuclear Med Study  William York is a 70 y.o. male [id number] 1940-02-10   Nuclear Med Background Indication for Stress Test:  Evaluation for Ischemia and Graft Patency History:  '99 CABG; 7/11 ION:GEXBMW, EF=56% Cardiac Risk Factors: Hypertension, Lipids and RBBB  Symptoms:  No cardiac complaints.   Nuclear Pre-Procedure Caffeine/Decaff Intake:  None NPO After: 9:00pm   Lungs:  Clear.   IV 0.9% NS with Angio Cath:  20g  IV Site: R Hand  IV Started by:  William York, CNMT  Chest Size (in):  42 in Cup Size: n/a  Height: 5\' 10"  (1.778 m)  Weight:  175 lb (79.379 kg)  BMI:  Body mass index is 25.11 kg/(m^2). Tech Comments:  Toprol held x 24 hours, no meds. taken this am, per patient.  Advised patient in future to only hold his beta blocker.  William York, CMA-N    Nuclear Med Study 1 or 2 day study: 1 day  Stress Test Type:  Stress  Reading MD: William Ancona, MD  Order Authorizing Provider:  Fredia Sorrow, MD  Resting Radionuclide: Technetium 53m Tetrofosmin  Resting Radionuclide Dose: 11.0 mCi   Stress Radionuclide:  Technetium 87m Tetrofosmin  Stress Radionuclide Dose: 33.0 mCi           Stress Protocol Rest HR: 62 Stress HR: 136  Rest BP: 131/85 Stress BP: 228/83  Exercise Time (min): 12:01 METS: 13.7   Predicted Max HR: 151 bpm % Max HR: 90.07 bpm Rate Pressure Product: 41324   Dose of Adenosine (mg):  n/a Dose of Lexiscan: n/a mg  Dose of Atropine (mg): n/a Dose of Dobutamine: n/a mcg/kg/min (at max HR)  Stress Test Technologist: William York, CMA-N  Nuclear Technologist:  William York, CNMT     Rest Procedure:  Myocardial perfusion imaging was performed at rest 45 minutes following the intravenous administration of Technetium 89m Tetrofosmin.  Rest ECG: IRBBB  Stress Procedure:  The patient exercised for 12:01 on the treadmill utilizing the Bruce  protocol.  The patient stopped due to fatigue and denied any chest pain.  There were no diagnostic ST-T wave changes. There were occasional PVC's with couplets and occasional PAC's. He did have a hypertensive response to exercise with peak BP >228/83 (see tech note).  Technetium 20m Tetrofosmin was injected at peak exercise and myocardial perfusion imaging was performed after a brief delay.  Stress ECG: No significant change from baseline ECG  QPS Raw Data Images:  Normal; no motion artifact; normal heart/lung ratio. Stress Images:  Basal inferior perfusion defect.  Rest Images:  Basal inferior perfusion defect, somewhat less marked than stress.  Subtraction (SDS):  Small partially reversible basal inferior perfusion defect.  Transient Ischemic Dilatation (Normal <1.22):  0.88 Lung/Heart Ratio (Normal <0.45):  0.27  Quantitative Gated Spect Images QGS EDV:  95 ml QGS ESV:  43 ml QGS cine images:  Basal inferior hypokinesis.  QGS EF: 54%  Impression Exercise Capacity:  Excellent exercise capacity. BP Response:  Hypertensive blood pressure response. Clinical Symptoms:  Fatigue, no chest pain.  ECG Impression:  No significant ST segment change suggestive of ischemia. Comparison with Prior Nuclear Study: No significant change from previous study  Overall Impression:  Low risk stress nuclear study.  Partially reversible basal inferior perfusion defect may represent a small area of scar with some ischemia, however it is small and does not appear significantly different from prior myoviews.  Excellent exercise capacity.   William York [employer]

## 2010-07-27 NOTE — Telephone Encounter (Signed)
There are 2 sets of instructions on losartan rx. Pharmacy would like to know which set os correct.   Ref # [id number]. Callback # [phone]

## 2010-07-28 MED ORDER — LOSARTAN POTASSIUM 50 MG PO TABS: ORAL_TABLET | ORAL | Status: DC

## 2010-07-28 NOTE — Telephone Encounter (Signed)
Instructions verified. Quantity corrected to 135

## 2010-07-28 NOTE — Progress Notes (Signed)
nuc med report routed to Dr. Tawanna Cooler 07/28/10 William York

## 2010-08-02 ENCOUNTER — Telehealth: Payer: Self-pay | Admitting: *Deleted

## 2010-08-02 NOTE — Telephone Encounter (Signed)
Pt is asking for Stress test results.

## 2010-08-04 NOTE — Telephone Encounter (Signed)
Please call stress test, normal,,,,,,,,, unchanged from last year

## 2010-08-04 NOTE — Telephone Encounter (Signed)
LMTCB

## 2011-07-24 ENCOUNTER — Encounter: Payer: Self-pay | Admitting: Family Medicine

## 2011-07-24 ENCOUNTER — Ambulatory Visit (INDEPENDENT_AMBULATORY_CARE_PROVIDER_SITE_OTHER): Payer: Medicare Other | Admitting: Family Medicine

## 2011-07-24 VITALS — BP 130/80 | Temp 98.4°F | Ht 70.5 in | Wt 190.0 lb

## 2011-07-24 DIAGNOSIS — R195 Other fecal abnormalities: Secondary | ICD-10-CM

## 2011-07-24 DIAGNOSIS — N138 Other obstructive and reflux uropathy: Secondary | ICD-10-CM

## 2011-07-24 DIAGNOSIS — I2510 Atherosclerotic heart disease of native coronary artery without angina pectoris: Secondary | ICD-10-CM

## 2011-07-24 DIAGNOSIS — E7800 Pure hypercholesterolemia, unspecified: Secondary | ICD-10-CM

## 2011-07-24 DIAGNOSIS — N401 Enlarged prostate with lower urinary tract symptoms: Secondary | ICD-10-CM

## 2011-07-24 DIAGNOSIS — R351 Nocturia: Secondary | ICD-10-CM

## 2011-07-24 DIAGNOSIS — B009 Herpesviral infection, unspecified: Secondary | ICD-10-CM

## 2011-07-24 DIAGNOSIS — F528 Other sexual dysfunction not due to a substance or known physiological condition: Secondary | ICD-10-CM

## 2011-07-24 DIAGNOSIS — I10 Essential (primary) hypertension: Secondary | ICD-10-CM

## 2011-07-24 LAB — CBC WITH DIFFERENTIAL/PLATELET
Basophils Absolute: 0 10*3/uL (ref 0.0–0.1)
Basophils Relative: 0.3 % (ref 0.0–3.0)
Eosinophils Absolute: 0.2 10*3/uL (ref 0.0–0.7)
Eosinophils Relative: 2.7 % (ref 0.0–5.0)
HCT: 44.3 % (ref 39.0–52.0)
Hemoglobin: 15 g/dL (ref 13.0–17.0)
Lymphocytes Relative: 20.6 % (ref 12.0–46.0)
Lymphs Abs: 1.2 10*3/uL (ref 0.7–4.0)
MCHC: 33.8 g/dL (ref 30.0–36.0)
MCV: 93.4 fl (ref 78.0–100.0)
Monocytes Absolute: 0.6 10*3/uL (ref 0.1–1.0)
Monocytes Relative: 9.6 % (ref 3.0–12.0)
Neutro Abs: 4 10*3/uL (ref 1.4–7.7)
Neutrophils Relative %: 66.8 % (ref 43.0–77.0)
Platelets: 174 10*3/uL (ref 150.0–400.0)
RBC: 4.75 Mil/uL (ref 4.22–5.81)
RDW: 12.6 % (ref 11.5–14.6)
WBC: 6 10*3/uL (ref 4.5–10.5)

## 2011-07-24 LAB — HEPATIC FUNCTION PANEL
ALT: 21 U/L (ref 0–53)
AST: 24 U/L (ref 0–37)
Albumin: 4 g/dL (ref 3.5–5.2)
Alkaline Phosphatase: 73 U/L (ref 39–117)
Bilirubin, Direct: 0.1 mg/dL (ref 0.0–0.3)
Total Bilirubin: 0.6 mg/dL (ref 0.3–1.2)
Total Protein: 6.7 g/dL (ref 6.0–8.3)

## 2011-07-24 LAB — POCT URINALYSIS DIPSTICK
Bilirubin, UA: NEGATIVE
Blood, UA: NEGATIVE
Glucose, UA: NEGATIVE
Ketones, UA: NEGATIVE
Leukocytes, UA: NEGATIVE
Nitrite, UA: NEGATIVE
Protein, UA: NEGATIVE
Spec Grav, UA: 1.02
Urobilinogen, UA: 0.2
pH, UA: 5.5

## 2011-07-24 LAB — BASIC METABOLIC PANEL
BUN: 14 mg/dL (ref 6–23)
CO2: 28 mEq/L (ref 19–32)
Calcium: 9.6 mg/dL (ref 8.4–10.5)
Chloride: 107 mEq/L (ref 96–112)
Creatinine, Ser: 0.8 mg/dL (ref 0.4–1.5)
GFR: 104.45 mL/min (ref >60.00)
Glucose, Bld: 98 mg/dL (ref 70–99)
Potassium: 5 mEq/L (ref 3.5–5.1)
Sodium: 142 mEq/L (ref 135–145)

## 2011-07-24 LAB — LIPID PANEL
Cholesterol: 153 mg/dL (ref 0–200)
HDL: 56.6 mg/dL (ref >39.00)
LDL Cholesterol: 72 mg/dL (ref 0–99)
Total CHOL/HDL Ratio: 3
Triglycerides: 123 mg/dL (ref 0.0–149.0)
VLDL: 24.6 mg/dL (ref 0.0–40.0)

## 2011-07-24 LAB — TSH: TSH: 0.95 u[IU]/mL (ref 0.35–5.50)

## 2011-07-24 LAB — PSA: PSA: 0.68 ng/mL (ref 0.10–4.00)

## 2011-07-24 MED ORDER — ACYCLOVIR 200 MG PO CAPS: 200.0000 mg | ORAL_CAPSULE | ORAL | Status: DC

## 2011-07-24 MED ORDER — METOPROLOL SUCCINATE ER 50 MG PO TB24: 50.0000 mg | ORAL_TABLET | Freq: Every day | ORAL | Status: DC

## 2011-07-24 MED ORDER — NITROGLYCERIN 0.4 MG SL SUBL: 0.4000 mg | SUBLINGUAL_TABLET | SUBLINGUAL | Status: DC | PRN

## 2011-07-24 MED ORDER — SILDENAFIL CITRATE 100 MG PO TABS: 100.0000 mg | ORAL_TABLET | Freq: Every day | ORAL | Status: DC | PRN

## 2011-07-24 MED ORDER — ATORVASTATIN CALCIUM 20 MG PO TABS: 20.0000 mg | ORAL_TABLET | Freq: Every day | ORAL | Status: DC

## 2011-07-24 MED ORDER — LOSARTAN POTASSIUM 50 MG PO TABS: ORAL_TABLET | ORAL | Status: DC

## 2011-07-24 NOTE — Patient Instructions (Addendum)
Continue your current medications  We will set up a followup consult and GI  Return in one year sooner if any problems  Set up a time in the next couple weeks to remove the lesion from the left ear

## 2011-07-24 NOTE — Progress Notes (Signed)
  Subjective:    Patient ID: William York, male    DOB: [birth year], 71 y.o.   MRN: [id number]  HPI William York is a 71 year old male married nonsmoker who comes in today for evaluation and a Medicare wellness exam  He has a history of underlying coronary disease had bypass surgery in 1999. He's been asymptomatic exercises on a regular basis. His weight is stable at 190 pounds his blood pressure is 130/80.  He takes acyclovir when necessary and aspirin tablet daily. He takes Cozaar Toprol for hypertension and nitroglycerin when necessary  He's not had to take any nitroglycerin. No chest pain  He takes Viagra when necessary for DD  He gets routine eye care, dental care, colonoscopy and GI 2006 normal, vaccinations tetanus 2012 Pneumovax x2 information given on shingles.  Cognitive function normal he walks on a regular basis home health safety reviewed no issues identified, no guns in the house, he does have a health care power of attorney and living will   Review of Systems  Constitutional: Negative.   HENT: Negative.   Eyes: Negative.   Respiratory: Negative.   Cardiovascular: Negative.   Gastrointestinal: Negative.   Genitourinary: Negative.   Musculoskeletal: Negative.   Skin: Negative.   Neurological: Negative.   Hematological: Negative.   Psychiatric/Behavioral: Negative.        Objective:   Physical Exam  Constitutional: He is oriented to person, place, and time. He appears well-developed and well-nourished.  HENT:  Head: Normocephalic and atraumatic.  Right Ear: External ear normal.  Left Ear: External ear normal.  Nose: Nose normal.  Mouth/Throat: Oropharynx is clear and moist.  Eyes: Conjunctivae and EOM are normal. Pupils are equal, round, and reactive to light.  Neck: Normal range of motion. Neck supple. No JVD present. No tracheal deviation present. No thyromegaly present.  Cardiovascular: Normal rate, regular rhythm, normal heart sounds and intact distal pulses.   Exam reveals no gallop and no friction rub.   No murmur heard. Pulmonary/Chest: Effort normal and breath sounds normal. No stridor. No respiratory distress. He has no wheezes. He has no rales. He exhibits no tenderness.  Abdominal: Soft. Bowel sounds are normal. He exhibits no distension and no mass. There is no tenderness. There is no rebound and no guarding.  Genitourinary: Prostate normal and penis normal. Guaiac positive stool. No penile tenderness.  Musculoskeletal: Normal range of motion. He exhibits no edema and no tenderness.  Lymphadenopathy:    He has no cervical adenopathy.  Neurological: He is alert and oriented to person, place, and time. He has normal reflexes. No cranial nerve deficit. He exhibits normal muscle tone.  Skin: Skin is warm and dry. No rash noted. No erythema. No pallor.  Psychiatric: He has a normal mood and affect. His behavior is normal. Judgment and thought content normal.          Assessment & Plan:  Healthy male  History of hypertension continue current meds  History of hyperlipidemia continue current meds  Coronary disease asymptomatic status post bypass surgery x13 years  Erectile dysfunction continue Viagra  Asymptomatic positive stool guaiac history of internal hemorrhoids however refer back to GI last colonoscopy 9 years ago

## 2011-07-25 ENCOUNTER — Encounter: Payer: Self-pay | Admitting: Internal Medicine

## 2011-08-15 ENCOUNTER — Ambulatory Visit (INDEPENDENT_AMBULATORY_CARE_PROVIDER_SITE_OTHER): Payer: Medicare Other | Admitting: Family Medicine

## 2011-08-15 ENCOUNTER — Encounter: Payer: Self-pay | Admitting: Family Medicine

## 2011-08-15 DIAGNOSIS — L570 Actinic keratosis: Secondary | ICD-10-CM

## 2011-08-15 DIAGNOSIS — I10 Essential (primary) hypertension: Secondary | ICD-10-CM

## 2011-08-15 MED ORDER — LOSARTAN POTASSIUM 50 MG PO TABS: ORAL_TABLET | ORAL | Status: DC

## 2011-08-15 NOTE — Addendum Note (Signed)
Addended by: Kern Reap B on: 08/15/2011 01:59 PM   Modules accepted: Orders

## 2011-08-15 NOTE — Progress Notes (Signed)
  Subjective:    Patient ID: William York, male    DOB: 1940-09-23, 71 y.o.   MRN: [id number]  HPI Sorin is a 71 year old male who comes in today for removal of an abnormal lesion on his left earlobe.  The lesion measures 6 mm x6 mm inside top of his left earlobe. It's scaly elevated and white in color.  After informed consent the lesion was anesthetized with 1% Xylocaine and removed with 2 mm margins. The base was cauterized Band-Aids applied the lesion was sent for pathologic analysis. The patient tolerated the procedure no complications   Review of Systems General and dermatologic review of systems otherwise negative    Objective:   Physical Exam  Procedure see above      Assessment & Plan:  Probable actinic keratosis  Pending

## 2011-08-15 NOTE — Patient Instructions (Signed)
Within 2 weeks we will cardiolith report

## 2011-09-07 ENCOUNTER — Encounter: Payer: Self-pay | Admitting: *Deleted

## 2011-09-26 ENCOUNTER — Ambulatory Visit (INDEPENDENT_AMBULATORY_CARE_PROVIDER_SITE_OTHER): Payer: Medicare Other | Admitting: Internal Medicine

## 2011-09-26 ENCOUNTER — Encounter: Payer: Self-pay | Admitting: Internal Medicine

## 2011-09-26 VITALS — BP 134/60 | HR 80 | Ht 70.0 in | Wt 186.0 lb

## 2011-09-26 DIAGNOSIS — K921 Melena: Secondary | ICD-10-CM

## 2011-09-26 DIAGNOSIS — K648 Other hemorrhoids: Secondary | ICD-10-CM

## 2011-09-26 MED ORDER — HYDROCORTISONE ACETATE 25 MG RE SUPP: 25.0000 mg | Freq: Every day | RECTAL | Status: DC

## 2011-09-26 NOTE — Progress Notes (Signed)
SUTTON HIRSCH 01-31-1940 MRN [id number]   History of Present Illness:  This is a 71 year old white male with Hemoccult-positive stool on his yearly physical exam by Dr.Todd. He denies any visible blood per rectum. His hemoglobin was 15.0 with a hematocrit 44.3. We have seen him in the past in July 2006 for routine colonoscopy due to Hemoccult-positive stool which showed internal hemorrhoids. There were no polyps. There is no family history of colon cancer. He has never had his hemorrhoids treated. He is on aspirin. An upper abdominal ultrasound in October 2008 was normal. Patient denies abdominal pain, weight loss, diarrhea or constipation. He was taking Advil 400 mg 3 times a day for a couple weeks in August but not at the time of his physical exam.   Past Medical History  Diagnosis Date  . Allergy   . Hyperlipidemia   . Hypertension   . ED (erectile dysfunction)   . CAD (coronary artery disease)   . OA (osteoarthritis)   . Internal hemorrhoids    Past Surgical History  Procedure Date  . Coronary artery bypass graft   . Hernia repair     reports that he has never smoked. He has never used smokeless tobacco. He reports that he drinks alcohol. He reports that he does not use illicit drugs. family history includes Colon cancer in his cousin. No Known Allergies      Review of Systems:Negative for dysphagia heartburn abdominal pain  The remainder of the 10 point ROS is negative except as outlined in H&P   Physical Exam: General appearance  Well developed, in no distress. Eyes- non icteric. HEENT nontraumatic, normocephalic. Mouth no lesions, tongue papillated, no cheilosis. Neck supple without adenopathy, thyroid not enlarged, no carotid bruits, no JVD. Lungs Clear to auscultation bilaterally. Cor normal S1, normal S2, regular rhythm, no murmur,  quiet precordium. Abdomen: Soft nontender with normoactive bowel sounds no tenderness. Liver edge at costal margin. Rectal:And  anoscopic exam reveals 2 large external hemorrhoidal tags. Rectal sphincter tone is increased. Anoscope was inserted into the anal canal with some resistance. There were first grade internal hemorrhoids with some hyperemic mucosa and small internal hemorrhoids without prolapse. Stool is trace Hemoccult positive after the anoscope was retracted and I did a digital exam.  Extremities no pedal edema. Skin no lesions. Neurological alert and oriented x 3. Psychological normal mood and affect.  Assessment and Plan:  Problem #1 Hemoccult-positive stool on physical exam in the presence of internal as well as external hemorrhoids and increased rectal sphincter tone. Patient will be due for a recall colonoscopy in July 2016. I believe his hemorrhoids are the cause of his positive stool. We will treat the hemorrhoids with Anusol HC suppositories. We will repeat Hemoccult cards after he completes the [facility] suppositories.  If the stool cards are positive for occult blood, we will proceed with a colonoscopy. If they are negative, then I would wait until recall colonoscopy in July 2016. He is comfortable with this plan.   09/26/2011 Lina Sar

## 2011-09-26 NOTE — Patient Instructions (Addendum)
We have sent the following medications to your pharmacy for you to pick up at your convenience: Anusol Please complete hemoccult cards as per written instructions on the package AFTER you have completed the Anusol suppositories. CC: Dr Kelle Darting

## 2011-10-10 ENCOUNTER — Other Ambulatory Visit: Payer: Self-pay | Admitting: Family Medicine

## 2011-10-10 NOTE — Telephone Encounter (Signed)
Caller: Dodd/Patient; Patient Name: William York; PCP: Governor Specking); Best Callback Phone Number: [phone]; Reason for call: Patient is calling to request a prescription [facility] [address] to allow him to receive his Shingles vaccination (Herpes Zoster vaccination). [facility] states must have [facility] from his provider. Patient is requesting prescription for vaccination be [facility] to [facility] [address]-[phone]. Last office visit was 08/15/11.   OFFICE: PLEASE FOLLOW UP WITH PATIENT IN REGARDS TO REQUEST FOR SHINGLE VACCINATION PRESCRIPTION BEING SENT TO [facility] AS NOTED ABOVE. THANKS

## 2011-10-11 MED ORDER — VARICELLA-ZOSTER IMMUNE GLOB 125 UNITS IJ SOLR: 1.0000 | Freq: Once | INTRAMUSCULAR | Status: DC

## 2011-10-25 ENCOUNTER — Other Ambulatory Visit: Payer: Medicare Other

## 2011-11-02 ENCOUNTER — Telehealth: Payer: Self-pay | Admitting: Internal Medicine

## 2011-11-02 LAB — HEMOCCULT SLIDES (X 3 CARDS)
Fecal Occult Blood: NEGATIVE
OCCULT 1: NEGATIVE
OCCULT 2: NEGATIVE
OCCULT 3: NEGATIVE
OCCULT 4: NEGATIVE
OCCULT 5: NEGATIVE

## 2011-11-02 NOTE — Telephone Encounter (Signed)
Patient advised that the results are negative.  We will call back if Dr. Juanda Chance has any additional recommendations

## 2011-11-03 NOTE — ED Provider Notes (Signed)
Order(s) created erroneously. Erroneous order ID: [id number] Order moved by: Lurline Hare Order move date/time: 11/03/2011  3:26 PM Source Patient:     [id number] Source Contact: 07/24/2011 Destination Patient:   [id number] Destination Contact: 10/17/2010

## 2012-07-23 ENCOUNTER — Ambulatory Visit (INDEPENDENT_AMBULATORY_CARE_PROVIDER_SITE_OTHER): Payer: Medicare Other | Admitting: Family Medicine

## 2012-07-23 ENCOUNTER — Encounter: Payer: Self-pay | Admitting: Family Medicine

## 2012-07-23 VITALS — Temp 98.4°F | Ht 70.25 in | Wt 190.0 lb

## 2012-07-23 DIAGNOSIS — I10 Essential (primary) hypertension: Secondary | ICD-10-CM

## 2012-07-23 DIAGNOSIS — I2510 Atherosclerotic heart disease of native coronary artery without angina pectoris: Secondary | ICD-10-CM

## 2012-07-23 DIAGNOSIS — M1990 Unspecified osteoarthritis, unspecified site: Secondary | ICD-10-CM

## 2012-07-23 DIAGNOSIS — Z0000 Encounter for general adult medical examination without abnormal findings: Secondary | ICD-10-CM

## 2012-07-23 DIAGNOSIS — Z125 Encounter for screening for malignant neoplasm of prostate: Secondary | ICD-10-CM

## 2012-07-23 DIAGNOSIS — E7800 Pure hypercholesterolemia, unspecified: Secondary | ICD-10-CM

## 2012-07-23 DIAGNOSIS — F528 Other sexual dysfunction not due to a substance or known physiological condition: Secondary | ICD-10-CM

## 2012-07-23 DIAGNOSIS — B009 Herpesviral infection, unspecified: Secondary | ICD-10-CM

## 2012-07-23 DIAGNOSIS — H9190 Unspecified hearing loss, unspecified ear: Secondary | ICD-10-CM

## 2012-07-23 LAB — CBC WITH DIFFERENTIAL/PLATELET
Basophils Absolute: 0 10*3/uL (ref 0.0–0.1)
Basophils Relative: 0.4 % (ref 0.0–3.0)
Eosinophils Absolute: 0.1 10*3/uL (ref 0.0–0.7)
Eosinophils Relative: 2.1 % (ref 0.0–5.0)
HCT: 44.3 % (ref 39.0–52.0)
Hemoglobin: 15.1 g/dL (ref 13.0–17.0)
Lymphocytes Relative: 23 % (ref 12.0–46.0)
Lymphs Abs: 1.5 10*3/uL (ref 0.7–4.0)
MCHC: 34 g/dL (ref 30.0–36.0)
MCV: 94.6 fl (ref 78.0–100.0)
Monocytes Absolute: 0.6 10*3/uL (ref 0.1–1.0)
Monocytes Relative: 8.5 % (ref 3.0–12.0)
Neutro Abs: 4.3 10*3/uL (ref 1.4–7.7)
Neutrophils Relative %: 66 % (ref 43.0–77.0)
Platelets: 212 10*3/uL (ref 150.0–400.0)
RBC: 4.69 Mil/uL (ref 4.22–5.81)
RDW: 12.7 % (ref 11.5–14.6)
WBC: 6.6 10*3/uL (ref 4.5–10.5)

## 2012-07-23 LAB — HEPATIC FUNCTION PANEL
ALT: 25 U/L (ref 0–53)
AST: 23 U/L (ref 0–37)
Albumin: 4.3 g/dL (ref 3.5–5.2)
Alkaline Phosphatase: 71 U/L (ref 39–117)
Bilirubin, Direct: 0.1 mg/dL (ref 0.0–0.3)
Total Bilirubin: 0.8 mg/dL (ref 0.3–1.2)
Total Protein: 7.1 g/dL (ref 6.0–8.3)

## 2012-07-23 LAB — BASIC METABOLIC PANEL
BUN: 14 mg/dL (ref 6–23)
CO2: 28 mEq/L (ref 19–32)
Calcium: 9.7 mg/dL (ref 8.4–10.5)
Chloride: 108 mEq/L (ref 96–112)
Creatinine, Ser: 0.8 mg/dL (ref 0.4–1.5)
GFR: 95.61 mL/min (ref >60.00)
Glucose, Bld: 93 mg/dL (ref 70–99)
Potassium: 4.6 mEq/L (ref 3.5–5.1)
Sodium: 141 mEq/L (ref 135–145)

## 2012-07-23 LAB — LIPID PANEL
Cholesterol: 154 mg/dL (ref 0–200)
HDL: 58.1 mg/dL (ref >39.00)
LDL Cholesterol: 72 mg/dL (ref 0–99)
Total CHOL/HDL Ratio: 3
Triglycerides: 118 mg/dL (ref 0.0–149.0)
VLDL: 23.6 mg/dL (ref 0.0–40.0)

## 2012-07-23 LAB — PSA: PSA: 0.61 ng/mL (ref 0.10–4.00)

## 2012-07-23 LAB — POCT URINALYSIS DIPSTICK
Bilirubin, UA: NEGATIVE
Blood, UA: NEGATIVE
Glucose, UA: NEGATIVE
Ketones, UA: NEGATIVE
Leukocytes, UA: NEGATIVE
Nitrite, UA: NEGATIVE
Protein, UA: NEGATIVE
Spec Grav, UA: 1.025
Urobilinogen, UA: 0.2
pH, UA: 5.5

## 2012-07-23 LAB — TSH: TSH: 1.13 u[IU]/mL (ref 0.35–5.50)

## 2012-07-23 MED ORDER — ATORVASTATIN CALCIUM 20 MG PO TABS: 20.0000 mg | ORAL_TABLET | Freq: Every day | ORAL | Status: DC

## 2012-07-23 MED ORDER — ACYCLOVIR 200 MG PO CAPS: 200.0000 mg | ORAL_CAPSULE | Freq: Every day | ORAL | Status: DC

## 2012-07-23 MED ORDER — SILDENAFIL CITRATE 100 MG PO TABS: 100.0000 mg | ORAL_TABLET | Freq: Every day | ORAL | Status: DC | PRN

## 2012-07-23 MED ORDER — LOSARTAN POTASSIUM 50 MG PO TABS: ORAL_TABLET | ORAL | Status: DC

## 2012-07-23 MED ORDER — NITROGLYCERIN 0.4 MG SL SUBL: 0.4000 mg | SUBLINGUAL_TABLET | SUBLINGUAL | Status: DC | PRN

## 2012-07-23 MED ORDER — METOPROLOL SUCCINATE ER 50 MG PO TB24: 50.0000 mg | ORAL_TABLET | Freq: Every day | ORAL | Status: DC

## 2012-07-23 NOTE — Patient Instructions (Signed)
Continue current medications and exercise program  Return in one year sooner if any problems  Your audiogram shows a significant hearing loss I would consider a audiogram at [facility]

## 2012-07-23 NOTE — Progress Notes (Signed)
  Subjective:    Patient ID: William York, male    DOB: 1940-11-21, 72 y.o.   MRN: [id number]  HPI William York is a 72 year old married male nonsmoker who comes in today for a Medicare wellness exam because of a history of HSV 1, hyperlipidemia, hypertension, coronary disease status post bypass surgery, erectile dysfunction  His medicines reviewed the been no changes  He says he feels well  He exercises on a regular basis plays golf a couple days a week no shortness of breath or chest pain. Cognitive function normal he exercises on a regular basis home health safety reviewed no issues identified, no guns in the house, he does have a health care power of attorney and living well. He gets routine eye care dental care colonoscopy and vaccinations up-to-date including shingles vaccine 2013  He does think he might have some high frequency hearing loss   Review of Systems  Constitutional: Negative.   HENT: Negative.   Eyes: Negative.   Respiratory: Negative.   Cardiovascular: Negative.   Gastrointestinal: Negative.   Genitourinary: Negative.   Musculoskeletal: Negative.   Skin: Negative.   Neurological: Negative.   Psychiatric/Behavioral: Negative.        Objective:   Physical Exam  Nursing note and vitals reviewed. Constitutional: He is oriented to person, place, and time. He appears well-developed and well-nourished.  HENT:  Head: Normocephalic and atraumatic.  Right Ear: External ear normal.  Left Ear: External ear normal.  Nose: Nose normal.  Mouth/Throat: Oropharynx is clear and moist.  Eyes: Conjunctivae and EOM are normal. Pupils are equal, round, and reactive to light.  Neck: Normal range of motion. Neck supple. No JVD present. No tracheal deviation present. No thyromegaly present.  Cardiovascular: Normal rate, regular rhythm, normal heart sounds and intact distal pulses.  Exam reveals no gallop and no friction rub.   No murmur heard. No carotid or aortic bruits peripheral  pulses 2+ out of 2 and symmetrical  Scar midline chest from previous bypass surgery  Pulmonary/Chest: Effort normal and breath sounds normal. No stridor. No respiratory distress. He has no wheezes. He has no rales. He exhibits no tenderness.  Abdominal: Soft. Bowel sounds are normal. He exhibits no distension and no mass. There is no tenderness. There is no rebound and no guarding.  Genitourinary: Rectum normal, prostate normal and penis normal. Guaiac negative stool. No penile tenderness.  Musculoskeletal: Normal range of motion. He exhibits no edema and no tenderness.  Lymphadenopathy:    He has no cervical adenopathy.  Neurological: He is alert and oriented to person, place, and time. He has normal reflexes. No cranial nerve deficit. He exhibits normal muscle tone.  Skin: Skin is warm and dry. No rash noted. No erythema. No pallor.  Total body skin exam normal he is a garden variety of freckles moles and seborrheic keratosis  Psychiatric: He has a normal mood and affect. His behavior is normal. Judgment and thought content normal.          Assessment & Plan:  Healthy male  History of coronary artery disease status post bypass surgery asymptomatic  Hyperlipidemia continue Lipitor check labs  Hypertension continue Cozaar and metoprolol  History of oral HSV 1 continue Zovirax daily  Erectile dysfunction Viagra when necessary  High-frequency hearing loss recommend ENT consult at [facility]

## 2012-08-05 ENCOUNTER — Telehealth: Payer: Self-pay | Admitting: Family Medicine

## 2012-08-05 DIAGNOSIS — B009 Herpesviral infection, unspecified: Secondary | ICD-10-CM

## 2012-08-05 DIAGNOSIS — E7800 Pure hypercholesterolemia, unspecified: Secondary | ICD-10-CM

## 2012-08-05 MED ORDER — PNEUMOCOCCAL VAC POLYVALENT 25 MCG/0.5ML IJ INJ: 0.5000 mL | INJECTION | Freq: Once | INTRAMUSCULAR | Status: DC

## 2012-08-05 MED ORDER — ACYCLOVIR 200 MG PO CAPS: 200.0000 mg | ORAL_CAPSULE | Freq: Every day | ORAL | Status: DC

## 2012-08-05 MED ORDER — ATORVASTATIN CALCIUM 20 MG PO TABS: 20.0000 mg | ORAL_TABLET | Freq: Every day | ORAL | Status: DC

## 2012-08-05 NOTE — Telephone Encounter (Signed)
Pt was here 7/15 for cpx. We renewed all meds at that time. However, [facility] is having trouble receiving. They need you to resend:  ACYCLOVIR ATORVASTATIN  To [facility]. Thank you.   Also, My Chart says pt is due for Pneumovax - please check and alter, as he feels he has had them both. If he DOES need it, please write rx so [facility]. Lastly, it says he needs colonoscopy. He saw GI in 2013 Juanda Chance). He is set up for recall with them to have repeat in 2016 - please alter record to reflect he is not over due.

## 2012-08-14 ENCOUNTER — Other Ambulatory Visit: Payer: Self-pay

## 2012-08-20 ENCOUNTER — Encounter: Payer: Self-pay | Admitting: Family Medicine

## 2013-02-03 ENCOUNTER — Ambulatory Visit (INDEPENDENT_AMBULATORY_CARE_PROVIDER_SITE_OTHER): Payer: Medicare Other | Admitting: Family Medicine

## 2013-02-03 ENCOUNTER — Encounter: Payer: Self-pay | Admitting: Family Medicine

## 2013-02-03 VITALS — BP 150/90 | Temp 98.2°F | Wt 197.0 lb

## 2013-02-03 DIAGNOSIS — M26629 Arthralgia of temporomandibular joint, unspecified side: Secondary | ICD-10-CM | POA: Insufficient documentation

## 2013-02-03 DIAGNOSIS — M2669 Other specified disorders of temporomandibular joint: Secondary | ICD-10-CM

## 2013-02-03 MED ORDER — AMITRIPTYLINE HCL 25 MG PO TABS: ORAL_TABLET | ORAL | Status: DC

## 2013-02-03 NOTE — Patient Instructions (Signed)
Motrin 400 mg twice daily with food  Mouth guard at bedtime  Elavil 25 mg,,,,,,,,, 1 tab daily at bedtime  Soft diet and no chewing him  If your symptoms persist then check with your dentist

## 2013-02-03 NOTE — Progress Notes (Signed)
   Subjective:    Patient ID: William York, male    DOB: Aug 04, 1940, 73 y.o.   MRN: [id number]  HPI William York is a 73 year old married male nonsmoker who comes in today for evaluation of pain in his right ear for 3 days  He noticed pain in his right ear on Saturday. Sunday it seemed to be a little bit worse now today it seems to be little better. He's not been sick in any way he said no fever sore throat etc. No history of trauma or recent dental work. He says it pain is worse when he chews   Review of Systems    review of systems negative Objective:   Physical Exam  Well-developed and nourished male no acute distress vital signs stable he is afebrile HEENT negative except for point tenderness right TMJ      Assessment & Plan:  Right TMJ syndrome plan treat symptomatically as outlined

## 2013-03-28 ENCOUNTER — Telehealth: Payer: Self-pay | Admitting: Family Medicine

## 2013-03-28 NOTE — Telephone Encounter (Signed)
I submitted the Quantity Limit Exception per pt's request and it was approve for 1.5 tablets per day.  The approval is good till 01/08/14.

## 2013-06-17 ENCOUNTER — Other Ambulatory Visit: Payer: Self-pay | Admitting: Dermatology

## 2013-06-23 ENCOUNTER — Other Ambulatory Visit: Payer: Self-pay | Admitting: Family Medicine

## 2013-06-24 ENCOUNTER — Other Ambulatory Visit: Payer: Self-pay | Admitting: Family Medicine

## 2013-07-24 ENCOUNTER — Ambulatory Visit (INDEPENDENT_AMBULATORY_CARE_PROVIDER_SITE_OTHER): Payer: Medicare Other | Admitting: Family Medicine

## 2013-07-24 ENCOUNTER — Encounter: Payer: Self-pay | Admitting: Family Medicine

## 2013-07-24 VITALS — BP 130/80 | Temp 98.4°F | Ht 70.0 in | Wt 198.0 lb

## 2013-07-24 DIAGNOSIS — N401 Enlarged prostate with lower urinary tract symptoms: Secondary | ICD-10-CM

## 2013-07-24 DIAGNOSIS — R351 Nocturia: Secondary | ICD-10-CM

## 2013-07-24 DIAGNOSIS — E785 Hyperlipidemia, unspecified: Secondary | ICD-10-CM

## 2013-07-24 DIAGNOSIS — R1013 Epigastric pain: Secondary | ICD-10-CM

## 2013-07-24 DIAGNOSIS — N138 Other obstructive and reflux uropathy: Secondary | ICD-10-CM

## 2013-07-24 DIAGNOSIS — E7800 Pure hypercholesterolemia, unspecified: Secondary | ICD-10-CM

## 2013-07-24 DIAGNOSIS — I10 Essential (primary) hypertension: Secondary | ICD-10-CM

## 2013-07-24 DIAGNOSIS — F528 Other sexual dysfunction not due to a substance or known physiological condition: Secondary | ICD-10-CM

## 2013-07-24 DIAGNOSIS — I2510 Atherosclerotic heart disease of native coronary artery without angina pectoris: Secondary | ICD-10-CM

## 2013-07-24 DIAGNOSIS — B009 Herpesviral infection, unspecified: Secondary | ICD-10-CM

## 2013-07-24 LAB — CBC WITH DIFFERENTIAL/PLATELET
Basophils Absolute: 0 10*3/uL (ref 0.0–0.1)
Basophils Relative: 0.2 % (ref 0.0–3.0)
Eosinophils Absolute: 0.2 10*3/uL (ref 0.0–0.7)
Eosinophils Relative: 2.7 % (ref 0.0–5.0)
HCT: 47 % (ref 39.0–52.0)
Hemoglobin: 15.8 g/dL (ref 13.0–17.0)
Lymphocytes Relative: 24.7 % (ref 12.0–46.0)
Lymphs Abs: 1.7 10*3/uL (ref 0.7–4.0)
MCHC: 33.7 g/dL (ref 30.0–36.0)
MCV: 93.8 fl (ref 78.0–100.0)
Monocytes Absolute: 0.7 10*3/uL (ref 0.1–1.0)
Monocytes Relative: 9.9 % (ref 3.0–12.0)
Neutro Abs: 4.2 10*3/uL (ref 1.4–7.7)
Neutrophils Relative %: 62.5 % (ref 43.0–77.0)
Platelets: 209 10*3/uL (ref 150.0–400.0)
RBC: 5 Mil/uL (ref 4.22–5.81)
RDW: 12.2 % (ref 11.5–15.5)
WBC: 6.7 10*3/uL (ref 4.0–10.5)

## 2013-07-24 LAB — TSH: TSH: 1.17 u[IU]/mL (ref 0.35–4.50)

## 2013-07-24 LAB — LIPID PANEL
Cholesterol: 148 mg/dL (ref 0–200)
HDL: 56.8 mg/dL (ref >39.00)
LDL Cholesterol: 71 mg/dL (ref 0–99)
NonHDL: 91.2
Total CHOL/HDL Ratio: 3
Triglycerides: 102 mg/dL (ref 0.0–149.0)
VLDL: 20.4 mg/dL (ref 0.0–40.0)

## 2013-07-24 LAB — HEPATIC FUNCTION PANEL
ALT: 30 U/L (ref 0–53)
AST: 25 U/L (ref 0–37)
Albumin: 4.2 g/dL (ref 3.5–5.2)
Alkaline Phosphatase: 79 U/L (ref 39–117)
Bilirubin, Direct: 0.1 mg/dL (ref 0.0–0.3)
Total Bilirubin: 0.8 mg/dL (ref 0.2–1.2)
Total Protein: 6.8 g/dL (ref 6.0–8.3)

## 2013-07-24 LAB — POCT URINALYSIS DIPSTICK
Bilirubin, UA: NEGATIVE
Blood, UA: NEGATIVE
Glucose, UA: NEGATIVE
Ketones, UA: NEGATIVE
Leukocytes, UA: NEGATIVE
Nitrite, UA: NEGATIVE
Protein, UA: NEGATIVE
Spec Grav, UA: 1.025
Urobilinogen, UA: 0.2
pH, UA: 5.5

## 2013-07-24 LAB — BASIC METABOLIC PANEL
BUN: 18 mg/dL (ref 6–23)
CO2: 26 mEq/L (ref 19–32)
Calcium: 9.6 mg/dL (ref 8.4–10.5)
Chloride: 106 mEq/L (ref 96–112)
Creatinine, Ser: 0.9 mg/dL (ref 0.4–1.5)
GFR: 92.79 mL/min (ref >60.00)
Glucose, Bld: 96 mg/dL (ref 70–99)
Potassium: 4.7 mEq/L (ref 3.5–5.1)
Sodium: 140 mEq/L (ref 135–145)

## 2013-07-24 LAB — PSA: PSA: 0.61 ng/mL (ref 0.10–4.00)

## 2013-07-24 MED ORDER — ATORVASTATIN CALCIUM 20 MG PO TABS: 20.0000 mg | ORAL_TABLET | Freq: Every day | ORAL | Status: DC

## 2013-07-24 MED ORDER — NITROGLYCERIN 0.4 MG SL SUBL: 0.4000 mg | SUBLINGUAL_TABLET | SUBLINGUAL | Status: DC | PRN

## 2013-07-24 MED ORDER — ACYCLOVIR 200 MG PO CAPS: 200.0000 mg | ORAL_CAPSULE | Freq: Every day | ORAL | Status: DC

## 2013-07-24 MED ORDER — SILDENAFIL CITRATE 100 MG PO TABS: ORAL_TABLET | ORAL | Status: DC

## 2013-07-24 MED ORDER — PNEUMOCOCCAL 13-VAL CONJ VACC IM SUSP: 0.5000 mL | INTRAMUSCULAR | Status: DC

## 2013-07-24 MED ORDER — METOPROLOL SUCCINATE ER 50 MG PO TB24: 50.0000 mg | ORAL_TABLET | Freq: Every day | ORAL | Status: DC

## 2013-07-24 NOTE — Progress Notes (Signed)
Pre visit review using our clinic review tool, if applicable. No additional management support is needed unless otherwise documented below in the visit note. 

## 2013-07-24 NOTE — Progress Notes (Signed)
   Subjective:    Patient ID: William York, male    DOB: [birth year], 73 y.o.   MRN: [id number]  HPI  Mikki Santee is a 73 year old male married nonsmoker who comes in today for evaluation because of a history of underlying HSV 1, hyperlipidemia, hypertension, coronary disease, and erectile dysfunction  He had coronary bypass surgery 14 years ago has done well. He has no chest pain shortness of breath etc. with exertion. He plays golf 3-4 days per week and walks a lot and feels well  Med list reviewed unchanged  He gets routine eye care, dental care, colonoscopy and GI,  Cognitive function normal he walks on a regular basis home health safety reviewed no issues identified no guns in the house and he does have a health care power of attorney and living well   Review of Systems  Constitutional: Negative.   HENT: Negative.   Eyes: Negative.   Respiratory: Negative.   Cardiovascular: Negative.   Gastrointestinal: Negative.   Genitourinary: Negative.   Musculoskeletal: Negative.   Skin: Negative.   Neurological: Negative.   Psychiatric/Behavioral: Negative.        Objective:   Physical Exam  Nursing note and vitals reviewed. Constitutional: He is oriented to person, place, and time. He appears well-developed and well-nourished.  HENT:  Head: Normocephalic and atraumatic.  Right Ear: External ear normal.  Left Ear: External ear normal.  Nose: Nose normal.  Mouth/Throat: Oropharynx is clear and moist.  Eyes: Conjunctivae and EOM are normal. Pupils are equal, round, and reactive to light.  Neck: Normal range of motion. Neck supple. No JVD present. No tracheal deviation present. No thyromegaly present.  Cardiovascular: Normal rate, regular rhythm, normal heart sounds and intact distal pulses.  Exam reveals no gallop and no friction rub.   No murmur heard. No carotid artery bruits for pulses 2+ and symmetrical  Pulmonary/Chest: Effort normal and breath sounds normal. No stridor. No  respiratory distress. He has no wheezes. He has no rales. He exhibits no tenderness.  Abdominal: Soft. Bowel sounds are normal. He exhibits no distension and no mass. There is no tenderness. There is no rebound and no guarding.  Genitourinary: Rectum normal and penis normal. Guaiac negative stool. No penile tenderness.  One plus symmetrical nonnodular BPH  Musculoskeletal: Normal range of motion. He exhibits no edema and no tenderness.  Lymphadenopathy:    He has no cervical adenopathy.  Neurological: He is alert and oriented to person, place, and time. He has normal reflexes. No cranial nerve deficit. He exhibits normal muscle tone.  Skin: Skin is warm and dry. No rash noted. No erythema. No pallor.  Well-healed scar midline chest from previous bypass surgery otherwise skin exam normal  Psychiatric: He has a normal mood and affect. His behavior is normal. Judgment and thought content normal.          Assessment & Plan:  Hypertension at goal continue current therapy  Hyperlipidemia goal continue current therapy  Coronary disease asymptomatic  History of recurrent HSV 1 continue acyclovir 200 mg daily  Erectile dysfunction Viagra when necessary

## 2013-07-24 NOTE — Patient Instructions (Signed)
Continue your current medications exercise program and diet  Return in one year sooner if any problems

## 2013-07-25 ENCOUNTER — Telehealth: Payer: Self-pay | Admitting: Family Medicine

## 2013-07-25 NOTE — Telephone Encounter (Signed)
Relevant patient education mailed to patient.  

## 2013-11-25 ENCOUNTER — Ambulatory Visit (INDEPENDENT_AMBULATORY_CARE_PROVIDER_SITE_OTHER): Payer: Medicare Other | Admitting: Family Medicine

## 2013-11-25 ENCOUNTER — Encounter: Payer: Self-pay | Admitting: Family Medicine

## 2013-11-25 VITALS — BP 160/80 | Temp 98.2°F | Wt 192.0 lb

## 2013-11-25 DIAGNOSIS — B9789 Other viral agents as the cause of diseases classified elsewhere: Principal | ICD-10-CM

## 2013-11-25 DIAGNOSIS — J069 Acute upper respiratory infection, unspecified: Secondary | ICD-10-CM

## 2013-11-25 MED ORDER — HYDROCODONE-HOMATROPINE 5-1.5 MG/5ML PO SYRP: 5.0000 mL | ORAL_SOLUTION | Freq: Three times a day (TID) | ORAL | Status: DC | PRN

## 2013-11-25 NOTE — Patient Instructions (Signed)
Drink lots of water  Tylenol........ 2 tabs 3 times a day when necessary  Hydromet.........Marland Kitchen 1/2-1 teaspoon up to 3 times daily. For cough and cold  Return when necessary

## 2013-11-25 NOTE — Progress Notes (Signed)
   Subjective:    Patient ID: William York, male    DOB: 08/15/1940, 73 y.o.   MRN: [id number]  HPI William York is a 73 year old male married nonsmoker who comes in today with a week's history of a cold manifested by head congestion sore throat and cough. No fever  His wife had this cold first   Review of Systems    review of systems otherwise negative Objective:   Physical Exam  Well-developed well-nourished male no acute distress vital signs stable he is afebrile HEENT were negative neck was supple no adenopathy lungs are clear      Assessment & Plan:  ,,,,,, viral syndrome,,,,,,,,,,,,,,, treat symptomatically with Tylenol and cough syrup

## 2014-02-04 ENCOUNTER — Telehealth: Payer: Self-pay | Admitting: Family Medicine

## 2014-02-04 NOTE — Telephone Encounter (Signed)
Pt said he has to have his physical in July will I need to check with another provider to see if they will do his physical

## 2014-02-05 NOTE — Telephone Encounter (Signed)
Okay per Dr Sherren Mocha.  He would like for him to see the new NP.

## 2014-02-06 NOTE — Telephone Encounter (Signed)
Patient is aware and will callback to make an appointment with [facility] in March.

## 2014-04-14 DIAGNOSIS — L812 Freckles: Secondary | ICD-10-CM | POA: Diagnosis not present

## 2014-04-14 DIAGNOSIS — L820 Inflamed seborrheic keratosis: Secondary | ICD-10-CM | POA: Diagnosis not present

## 2014-04-14 DIAGNOSIS — L853 Xerosis cutis: Secondary | ICD-10-CM | POA: Diagnosis not present

## 2014-04-14 DIAGNOSIS — L821 Other seborrheic keratosis: Secondary | ICD-10-CM | POA: Diagnosis not present

## 2014-04-14 DIAGNOSIS — Z85828 Personal history of other malignant neoplasm of skin: Secondary | ICD-10-CM | POA: Diagnosis not present

## 2014-04-16 ENCOUNTER — Telehealth: Payer: Self-pay | Admitting: Family Medicine

## 2014-04-16 NOTE — Telephone Encounter (Signed)
Pt said dr todd told himto schedule his physical in July with cory. Can I sch?

## 2014-05-04 ENCOUNTER — Other Ambulatory Visit: Payer: Self-pay | Admitting: Family Medicine

## 2014-05-07 ENCOUNTER — Encounter: Payer: Self-pay | Admitting: Internal Medicine

## 2014-05-08 NOTE — Telephone Encounter (Signed)
[facility] - Client [facility]  Patient Name: William York  DOB: 1940-08-10    Initial Comment Caller states has been feeling light headed and nauseous.    Nurse Assessment  Nurse: Justine Null, RN, Rodena Piety Date/Time Eilene Ghazi Time): 05/08/2014 4:28:32 PM  Confirm and document reason for call. If symptomatic, describe symptoms. ---Caller states has been feeling light headed and nauseous. that started today at noon and has been able to take fluids fairly well and has been bale to eat a little bit and has no vomiting or diarrhea and has had stomach feeling of rolling and making noises and has had no fever and has felt warm to touch  Has the patient traveled out of the country within the last 30 days? ---No  Does the patient require triage? ---Yes  Related visit to physician within the last 2 weeks? ---No  Does the PT have any chronic conditions? (i.e. diabetes, asthma, etc.) ---Yes  List chronic conditions. ---HTN CABG heart disease     Guidelines    Guideline Title Affirmed Question Affirmed Notes  Dizziness - Lightheadedness [1] MODERATE dizziness (e.g., interferes with normal activities) AND [2] has NOT been evaluated by physician for this (Exception: dizziness caused by heat exposure, sudden standing, or poor fluid intake)    Final Disposition User   See Physician within [place], Therapist, sports, Rodena Piety

## 2014-05-09 ENCOUNTER — Encounter: Payer: Self-pay | Admitting: Internal Medicine

## 2014-05-09 ENCOUNTER — Ambulatory Visit (INDEPENDENT_AMBULATORY_CARE_PROVIDER_SITE_OTHER): Payer: Medicare Other | Admitting: Internal Medicine

## 2014-05-09 VITALS — BP 120/80 | Temp 98.4°F | Wt 193.0 lb

## 2014-05-09 DIAGNOSIS — R42 Dizziness and giddiness: Secondary | ICD-10-CM | POA: Diagnosis not present

## 2014-05-09 NOTE — Progress Notes (Signed)
Pre visit review using our clinic review tool, if applicable. No additional management support is needed unless otherwise documented below in the visit note. 

## 2014-05-09 NOTE — Progress Notes (Signed)
Subjective:    Patient ID: [name], male    DOB: 1940/11/23, 74 y.o.   MRN: [id number]  HPI Here due to not feeling well yesterday Starting mid day--stomach upset, burping, lightheaded Called office and spoke to nurse Told to drink more fluids and keep legs up ---?dehydration  Still able to eat okay Some burping but stomach not as noisy Feels better but not right  No vomiting or diarrhea Felt warm and had forehead sweat--- no clear cut fever  No ill exposures that he knows of  Current Outpatient Prescriptions on File Prior to Visit  Medication Sig Dispense Refill  . acyclovir (ZOVIRAX) 200 MG capsule Take 1 capsule (200 mg total) by mouth daily. 100 capsule 3  . aspirin 325 MG tablet Take 325 mg by mouth daily.      Marland Kitchen atorvastatin (LIPITOR) 20 MG tablet Take 1 tablet (20 mg total) by mouth daily. 100 tablet 3  . losartan (COZAAR) 50 MG tablet Take 1 and 1/2 tablets by   mouth in the morning 135 tablet 1  . metoprolol succinate (TOPROL-XL) 50 MG 24 hr tablet Take 1 tablet (50 mg total) by mouth daily. 100 tablet 3  . nitroGLYCERIN (NITROSTAT) 0.4 MG SL tablet Place 1 tablet (0.4 mg total) under the tongue every 5 (five) minutes as needed. 25 tablet 1  . sildenafil (VIAGRA) 100 MG tablet TAKE 1 TABLET BY MOUTH DAILY AS NEEDED 10 tablet 11   No current facility-administered medications on file prior to visit.    No Known Allergies  Past Medical History  Diagnosis Date  . Allergy   . Hyperlipidemia   . Hypertension   . ED (erectile dysfunction)   . CAD (coronary artery disease)   . OA (osteoarthritis)   . Internal hemorrhoids     Past Surgical History  Procedure Laterality Date  . Coronary artery bypass graft    . Hernia repair      Family History  Problem Relation Age of Onset  . Colon cancer Cousin     Mother side-First cousin    History   Social History  . Marital Status: Married    Spouse Name: N/A  . Number of Children: N/A  . Years of  Education: N/A   Occupational History  . Not on file.   Social History Main Topics  . Smoking status: Never Smoker   . Smokeless tobacco: Never Used  . Alcohol Use: Yes     Comment: one daily   . Drug Use: No  . Sexual Activity: Not on file   Other Topics Concern  . Not on file   Social History Narrative   Daily caffeine    Review of Systems Had slight spinning sensation yesterday--but he will get that if he pops out of bed too quick No cough  No SOB No rash    Objective:   Physical Exam  Constitutional: He appears well-developed and well-nourished. No distress.  HENT:  Mouth/Throat: Oropharynx is clear and moist. No oropharyngeal exudate.  TMs and canals are normal  Eyes:  No nystagmus  Neck: Normal range of motion. Neck supple. No thyromegaly present.  Cardiovascular: Normal rate, regular rhythm and normal heart sounds.  Exam reveals no gallop.   No murmur heard. Pulmonary/Chest: Effort normal and breath sounds normal. No respiratory distress. He has no wheezes. He has no rales.  Musculoskeletal: He exhibits no edema.  Lymphadenopathy:    He has no cervical adenopathy.  Skin: No rash noted.  Assessment & Plan:

## 2014-05-09 NOTE — Assessment & Plan Note (Signed)
With slight GI symptoms Better now ?vestibular ?slight GI bug Reassured--observation only

## 2014-05-21 ENCOUNTER — Encounter: Payer: Self-pay | Admitting: Internal Medicine

## 2014-05-21 NOTE — Telephone Encounter (Signed)
Pt has sch °

## 2014-06-24 LAB — FECAL OCCULT BLOOD, GUAIAC: Fecal Occult Blood: NEGATIVE

## 2014-07-07 ENCOUNTER — Ambulatory Visit (AMBULATORY_SURGERY_CENTER): Payer: Self-pay

## 2014-07-07 VITALS — Ht 70.0 in | Wt 190.4 lb

## 2014-07-07 DIAGNOSIS — Z1211 Encounter for screening for malignant neoplasm of colon: Secondary | ICD-10-CM

## 2014-07-07 NOTE — Progress Notes (Signed)
No allergies to eggs or soy No home oxygen No diet/weight loss meds No past problems with anesthesia  Has email  Emmi instructions given for colonoscopy 

## 2014-07-08 ENCOUNTER — Encounter: Payer: Self-pay | Admitting: Family Medicine

## 2014-07-12 ENCOUNTER — Other Ambulatory Visit: Payer: Self-pay | Admitting: Family Medicine

## 2014-07-22 ENCOUNTER — Ambulatory Visit (AMBULATORY_SURGERY_CENTER): Payer: Medicare Other | Admitting: Internal Medicine

## 2014-07-22 ENCOUNTER — Encounter: Payer: Self-pay | Admitting: Internal Medicine

## 2014-07-22 VITALS — BP 112/59 | HR 56 | Temp 97.1°F | Resp 15 | Ht 70.0 in | Wt 190.0 lb

## 2014-07-22 DIAGNOSIS — Z1211 Encounter for screening for malignant neoplasm of colon: Secondary | ICD-10-CM | POA: Diagnosis present

## 2014-07-22 MED ORDER — SODIUM CHLORIDE 0.9 % IV SOLN: 500.0000 mL | INTRAVENOUS | Status: DC

## 2014-07-22 NOTE — Patient Instructions (Signed)
Discharge instructions given. Normal exam. Resume previous medications. YOU HAD AN ENDOSCOPIC PROCEDURE TODAY AT THE [facility]:   Refer to the procedure report that was given to you for any specific questions about what was found during the examination.  If the procedure report does not answer your questions, please call your gastroenterologist to clarify.  If you requested that your care partner not be given the details of your procedure findings, then the procedure report has been included in a sealed envelope for you to review at your convenience later.  YOU SHOULD EXPECT: Some feelings of bloating in the abdomen. Passage of more gas than usual.  Walking can help get rid of the air that was put into your GI tract during the procedure and reduce the bloating. If you had a lower endoscopy (such as a colonoscopy or flexible sigmoidoscopy) you may notice spotting of blood in your stool or on the toilet paper. If you underwent a bowel prep for your procedure, you may not have a normal bowel movement for a few days.  Please Note:  You might notice some irritation and congestion in your nose or some drainage.  This is from the oxygen used during your procedure.  There is no need for concern and it should clear up in a day or so.  SYMPTOMS TO REPORT IMMEDIATELY:   Following lower endoscopy (colonoscopy or flexible sigmoidoscopy):  Excessive amounts of blood in the stool  Significant tenderness or worsening of abdominal pains  Swelling of the abdomen that is new, acute  Fever of 100F or higher   For urgent or emergent issues, a gastroenterologist can be reached at any hour by calling [phone].   DIET: Your first meal following the procedure should be a small meal and then it is ok to progress to your normal diet. Heavy or fried foods are harder to digest and may make you feel nauseous or bloated.  Likewise, meals heavy in dairy and vegetables can increase bloating.  Drink plenty  of fluids but you should avoid alcoholic beverages for 24 hours.  ACTIVITY:  You should plan to take it easy for the rest of today and you should NOT DRIVE or use heavy machinery until tomorrow (because of the sedation medicines used during the test).    FOLLOW UP: Our staff will call the number listed on your records the next business day following your procedure to check on you and address any questions or concerns that you may have regarding the information given to you following your procedure. If we do not reach you, we will leave a message.  However, if you are feeling well and you are not experiencing any problems, there is no need to return our call.  We will assume that you have returned to your regular daily activities without incident.  If any biopsies were taken you will be contacted by phone or by letter within the next 1-3 weeks.  Please call us at [phone] if you have not heard about the biopsies in 3 weeks.    SIGNATURES/CONFIDENTIALITY: You and/or your care partner have signed paperwork which will be entered into your electronic medical record.  These signatures attest to the fact that that the information above on your After Visit Summary has been reviewed and is understood.  Full responsibility of the confidentiality of this discharge information lies with you and/or your care-partner. 

## 2014-07-22 NOTE — Op Note (Signed)
[place]  [address] [place], [id number]   COLONOSCOPY PROCEDURE REPORT  PATIENT: William York, William York  MR#: [id number] BIRTHDATE: January 04, 1941 , 60  yrs. old GENDER: male ENDOSCOPIST: Lafayette Dragon, MD REFERRED OZ:[id number] Delora Fuel, M.D. PROCEDURE DATE:  07/22/2014 PROCEDURE:   Colonoscopy, screening First Screening Colonoscopy - Avg.  risk and is 50 yrs.  old or older - No.  Prior Negative Screening - Now for repeat screening. 10 or more years since last screening  History of Adenoma - Now for follow-up colonoscopy & has been > or = to 3 yrs.  N/A  Polyps removed today? No Recommend repeat exam, <10 yrs? No ASA CLASS:   Class II INDICATIONS:Screening for colonic neoplasia, Colorectal Neoplasm Risk Assessment for this procedure is average risk, and prior colonoscopy about 10 years ago was normal. MEDICATIONS: Monitored anesthesia care and Propofol 300 mg IV  DESCRIPTION OF PROCEDURE:   After the risks benefits and alternatives of the procedure were thoroughly explained, informed consent was obtained.  The digital rectal exam revealed no abnormalities of the rectum.   The [id number]  endoscope was introduced through the anus and advanced to the cecum, which was identified by both the appendix and ileocecal valve. No adverse events experienced.   The quality of the prep was good.  (MoviPrep was used)  The instrument was then slowly withdrawn as the colon was fully examined. Estimated blood loss is zero unless otherwise noted in this procedure report.      COLON FINDINGS: A normal appearing cecum, ileocecal valve, and appendiceal orifice were identified.  The ascending, transverse, descending, sigmoid colon, and rectum appeared unremarkable. Retroflexed views revealed no abnormalities. The time to cecum = 16.40 Withdrawal time = 6.56   The scope was withdrawn and the procedure completed. COMPLICATIONS: There were no immediate complications.  ENDOSCOPIC  IMPRESSION: Normal colonoscopy  RECOMMENDATIONS: Recall colonoscopy in 10 if patient in good health otherwise no need for recall colonoscopy due to age  eSigned:  Lafayette Dragon, MD 07/22/2014 9:16 AM   cc:

## 2014-07-22 NOTE — Progress Notes (Signed)
Stable to RR 

## 2014-07-23 ENCOUNTER — Telehealth: Payer: Self-pay | Admitting: Emergency Medicine

## 2014-07-23 NOTE — Telephone Encounter (Signed)
  Follow up Call-  Call back number 07/22/2014  Post procedure Call Back phone  # [phone]  Permission to leave phone message Yes     Patient questions:  Do you have a fever, pain , or abdominal swelling? No. Pain Score  0 *  Have you tolerated food without any problems? Yes.    Have you been able to return to your normal activities? Yes.    Do you have any questions about your discharge instructions: Diet   No. Medications  No. Follow up visit  No.  Do you have questions or concerns about your Care? No.  Actions: * If pain score is 4 or above: No action needed, pain <4.

## 2014-07-25 ENCOUNTER — Other Ambulatory Visit: Payer: Self-pay | Admitting: Family Medicine

## 2014-07-28 ENCOUNTER — Other Ambulatory Visit: Payer: Self-pay | Admitting: *Deleted

## 2014-07-28 ENCOUNTER — Encounter: Payer: Self-pay | Admitting: Adult Health

## 2014-07-28 DIAGNOSIS — B009 Herpesviral infection, unspecified: Secondary | ICD-10-CM

## 2014-07-28 MED ORDER — ATORVASTATIN CALCIUM 20 MG PO TABS: ORAL_TABLET | ORAL | Status: DC

## 2014-07-28 MED ORDER — ACYCLOVIR 200 MG PO CAPS: 200.0000 mg | ORAL_CAPSULE | Freq: Every day | ORAL | Status: DC

## 2014-08-19 ENCOUNTER — Telehealth: Payer: Self-pay | Admitting: *Deleted

## 2014-08-19 NOTE — Telephone Encounter (Signed)
>','<< Less Detail',event)" href="javascript:;">More Detail >>   History   [name]   Sent: Wed August 19, 2014 4:26 PM    William York   MRN: [id number] DOB: 15-Nov-1940   Pt Home: [phone]     Entered: [phone]      Message    History questionnaire submitted on Wednesday, August 19, 2014 at 4:25:05 PM   Questionnaire: History   Patient: William York [[id number]]         Medical History:      Question: Allergies   Response: No   Date:  Comments:       Question: Depression   Response: No   Date:  Comments:       Question: Myocardial infarction   Response: No   Date:  Comments:       Question: Anemia   Response: No   Date:  Comments:       Question: Diabetes mellitus   Response: No   Date:  Comments:       Question: Nerve / muscle disease   Response: No   Date:  Comments:       Question: Anxiety   Response: No   Date:  Comments:       Question: Emphysema   Response: No   Date:  Comments:       Question: Brittle bones   Response: No   Date:  Comments:       Question: Arthritis   Response: No   Date:  Comments:       Question: Acid reflux   Response: No   Date:  Comments:       Question: Seizures   Response: No   Date:  Comments:       Question: Asthma   Response: No   Date:  Comments:       Question: Glaucoma   Response: No   Date:  Comments:       Question: Sickle cell anemia   Response: No   Date:  Comments:       Question: Blood transfusion   Response: No   Date:  Comments:       Question: Heart murmur   Response: No   Date:  Comments:       Question: Stroke   Response: No   Date:  Comments:       Question: Cancer   Response: No   Date:  Comments:       Question: HIV/AIDS   Response: No   Date:  Comments:       Question: Substance abuse   Response: No   Date:  Comments:        Question: Cataracts   Response: No   Date:  Comments:       Question: Hyperlipidemia   Response: Yes   Date:  Comments:       Question: Thyroid disease   Response: No   Date:  Comments:       Question: Bleeding problem   Response: No   Date:  Comments:       Question: High blood pressure   Response: Yes   Date:  Comments:       Question: Tuberculosis   Response: No   Date:  Comments:       Question: Congestive heart failure   Response: No   Date:  Comments:       Question: Kidney disease   Response: No   Date:  Comments:       Question: Ulcers   Response: No   Date:  Comments:       Question: COPD / chronic bronchitis   Response: No   Date:  Comments:       Question: History of Sleep Apnea   Response: No   Date:  Comments:       Question: On Home Oxygen Therapy   Response: No   Date:  Comments:             Surgical History:      Question: Appendectomy   Response: No   Date:  Comments:       Question: Plastic surgery   Response: No   Date:  Comments:       Question: Joint replacement   Response: No   Date:  Comments:       Question: Brain surgery   Response: No   Date:  Comments:       Question: C-Section   Response: No   Date:  Comments:       Question: Small intestine surgery   Response: No   Date:  Comments:       Question: Breast surgery   Response: No   Date:  Comments:       Question: Eye surgery   Response: No   Date:  Comments:       Question: Spine surgery   Response: No   Date:  Comments:       Question: Heart bypass   Response: Yes   Date: June 1999 Comments:       Question: Fracture surgery   Response: No   Date:  Comments:       Question: Tubes tied   Response: No   Date:  Comments:       Question: Gall bladder removal   Response: No   Date:  Comments:       Question: Hernia  repair   Response: Yes   Date: May 2006 Comments:       Question: Heart valve replacement   Response: No   Date:  Comments:       Question: Colon / large intestine surgery   Response: No   Date:  Comments:       Question: Hysterectomy   Response: No   Date:  Comments:             Family History:      Problem: Asthma      Relation: Mother   Name:    Comments:       Problem: Diabetes      Relation: Father   Name:    Comments:          Social History:      Question: Sexually Active   Response: Yes   Birth Control / Protection:    Comments:       Question: Tobacco Use (if former smoker, must enter    quit date)   Response: Never   Question: Ready to Quit   Response: No Response   Comments:       Question: Drug Use   Response: No      Question: Alcohol Use   Response: Yes   Drinks/Week: 2 Glasses of wine, 3 Drinks containing    0.5 oz of alcohol   Comments:

## 2014-08-20 ENCOUNTER — Encounter: Payer: Self-pay | Admitting: Adult Health

## 2014-08-20 ENCOUNTER — Ambulatory Visit (INDEPENDENT_AMBULATORY_CARE_PROVIDER_SITE_OTHER): Payer: Medicare Other | Admitting: Adult Health

## 2014-08-20 VITALS — BP 110/70 | Temp 97.8°F | Ht 70.0 in | Wt 185.7 lb

## 2014-08-20 DIAGNOSIS — I10 Essential (primary) hypertension: Secondary | ICD-10-CM

## 2014-08-20 DIAGNOSIS — Z760 Encounter for issue of repeat prescription: Secondary | ICD-10-CM

## 2014-08-20 DIAGNOSIS — E7800 Pure hypercholesterolemia, unspecified: Secondary | ICD-10-CM

## 2014-08-20 DIAGNOSIS — Z125 Encounter for screening for malignant neoplasm of prostate: Secondary | ICD-10-CM | POA: Diagnosis not present

## 2014-08-20 DIAGNOSIS — Z0000 Encounter for general adult medical examination without abnormal findings: Secondary | ICD-10-CM | POA: Diagnosis not present

## 2014-08-20 DIAGNOSIS — F528 Other sexual dysfunction not due to a substance or known physiological condition: Secondary | ICD-10-CM

## 2014-08-20 LAB — POCT URINALYSIS DIPSTICK
Bilirubin, UA: NEGATIVE
Blood, UA: NEGATIVE
Glucose, UA: NEGATIVE
Ketones, UA: NEGATIVE
Leukocytes, UA: NEGATIVE
Nitrite, UA: NEGATIVE
Protein, UA: NEGATIVE
Spec Grav, UA: 1.02
Urobilinogen, UA: 0.2
pH, UA: 5.5

## 2014-08-20 LAB — LIPID PANEL
Cholesterol: 138 mg/dL (ref 0–200)
HDL: 61.1 mg/dL (ref >39.00)
LDL Cholesterol: 57 mg/dL (ref 0–99)
NonHDL: 76.56
Total CHOL/HDL Ratio: 2
Triglycerides: 97 mg/dL (ref 0.0–149.0)
VLDL: 19.4 mg/dL (ref 0.0–40.0)

## 2014-08-20 LAB — CBC
HCT: 48.2 % (ref 39.0–52.0)
Hemoglobin: 16.4 g/dL (ref 13.0–17.0)
MCHC: 34 g/dL (ref 30.0–36.0)
MCV: 93.4 fl (ref 78.0–100.0)
Platelets: 224 10*3/uL (ref 150.0–400.0)
RBC: 5.16 Mil/uL (ref 4.22–5.81)
RDW: 13 % (ref 11.5–15.5)
WBC: 7.2 10*3/uL (ref 4.0–10.5)

## 2014-08-20 LAB — HEPATIC FUNCTION PANEL
ALT: 28 U/L (ref 0–53)
AST: 26 U/L (ref 0–37)
Albumin: 4.6 g/dL (ref 3.5–5.2)
Alkaline Phosphatase: 85 U/L (ref 39–117)
Bilirubin, Direct: 0.3 mg/dL (ref 0.0–0.3)
Total Bilirubin: 1 mg/dL (ref 0.2–1.2)
Total Protein: 7.6 g/dL (ref 6.0–8.3)

## 2014-08-20 LAB — BASIC METABOLIC PANEL
BUN: 23 mg/dL (ref 6–23)
CO2: 27 mEq/L (ref 19–32)
Calcium: 10.2 mg/dL (ref 8.4–10.5)
Chloride: 103 mEq/L (ref 96–112)
Creatinine, Ser: 0.98 mg/dL (ref 0.40–1.50)
GFR: 79.57 mL/min (ref >60.00)
Glucose, Bld: 108 mg/dL — ABNORMAL HIGH (ref 70–99)
Potassium: 5.4 mEq/L — ABNORMAL HIGH (ref 3.5–5.1)
Sodium: 140 mEq/L (ref 135–145)

## 2014-08-20 LAB — TSH: TSH: 0.91 u[IU]/mL (ref 0.35–4.50)

## 2014-08-20 MED ORDER — LOSARTAN POTASSIUM 50 MG PO TABS: ORAL_TABLET | ORAL | Status: DC

## 2014-08-20 MED ORDER — SILDENAFIL CITRATE 100 MG PO TABS: ORAL_TABLET | ORAL | Status: DC

## 2014-08-20 MED ORDER — NITROGLYCERIN 0.4 MG SL SUBL: 0.4000 mg | SUBLINGUAL_TABLET | SUBLINGUAL | Status: DC | PRN

## 2014-08-20 MED ORDER — ACYCLOVIR 200 MG PO CAPS: ORAL_CAPSULE | ORAL | Status: DC

## 2014-08-20 MED ORDER — METOPROLOL SUCCINATE ER 50 MG PO TB24: ORAL_TABLET | ORAL | Status: DC

## 2014-08-20 MED ORDER — ATORVASTATIN CALCIUM 20 MG PO TABS: ORAL_TABLET | ORAL | Status: DC

## 2014-08-20 NOTE — Progress Notes (Signed)
Pre visit review using our clinic review tool, if applicable. No additional management support is needed unless otherwise documented below in the visit note. 

## 2014-08-20 NOTE — Progress Notes (Signed)
Subjective:  Patient presents today for their annual wellness visit.  He is a pleasant 74 year old male witha  history of underlying HSV 1, hyperlipidemia, hypertension, coronary disease, and erectile dysfunction  He had coronary bypass surgery 15 years ago has done well. He has no chest pain shortness of breath etc. with exertion. He plays golf 3-4 days per week and walks a lot and feels well. He has never had to use his nitro.   Med list reviewed unchanged  He gets routine eye care, dental care, colonoscopy and GI,  Cognitive function normal he walks on a regular basis home health safety reviewed no issues identified no guns in the house and he does have a health care power of attorney and living well   Preventive Screening-Counseling & Management  Smoking Status:Never Smoker Second Hand Smoking status:No smokers in home  Risk Factors Regular exercise: He exercises 4-5 times a week, walking and run.  Diet: Eats healthy  Fall Risk: None   Cardiac risk factors:  advanced age (older than 63 for men, 17 for women) - Yes Hyperlipidemia-Yes No diabetes.  Family History: No  Depression Screen None. PHQ2 0   Activities of Daily Living Independent ADLs and IADLs   Hearing Difficulties: He is hard of hearing and has had his hearing tested two years ago, He does not want to have hearing aids at this time.   Cognitive Testing No reported trouble.   Normal 3 word recall  List the Names of Other Physician/Practitioners you currently use: 1. Optho Dr. Delman Cheadle  Immunization History  Administered Date(s) Administered  . Influenza Split 09/18/2012  . Influenza Whole 11/01/2006, 10/09/2008, 09/23/2011  . Influenza, High Dose Seasonal PF 10/07/2013  . Pneumococcal Conjugate-13 10/07/2013  . Pneumococcal Polysaccharide-23 01/09/1997, 07/01/2004, 08/15/2012  . Td 06/26/2006  . Tdap 07/21/2010  . Zoster 09/23/2012   Required Immunizations needed today: None, he is up  to date  Screening tests- up to date Health Maintenance Due  Topic Date Due  . INFLUENZA VACCINE  08/10/2014    ROS- He has a complaint of increased trouble with getting an erection and maintaining. He is currently taking 50mg  Viagra. Denies that it happens with every sexual encounter.   The following were reviewed and entered/updated in epic: Past Medical History  Diagnosis Date  . Allergy   . Hyperlipidemia   . Hypertension   . ED (erectile dysfunction)   . CAD (coronary artery disease)   . OA (osteoarthritis)   . Internal hemorrhoids    Patient Active Problem List   Diagnosis Date Noted  . Lightheadedness 05/09/2014  . Viral URI with cough 11/25/2013  . TMJ syndrome 02/03/2013  . Actinic keratosis 08/15/2011  . CORONARY ARTERY DISEASE 07/08/2007  . OSTEOARTHRITIS 07/08/2007  . BENIGN POSITIONAL VERTIGO 04/23/2007  . HYPERCHOLESTEROLEMIA 04/13/2007  . ERECTILE DYSFUNCTION 10/04/2006  . HYPERTENSION 10/04/2006  . ALLERGIC RHINITIS 10/04/2006  . INTERNAL HEMORRHOIDS 07/15/2004   Past Surgical History  Procedure Laterality Date  . Coronary artery bypass graft  June 1999  . Hernia repair  May 2006    Family History  Problem Relation Age of Onset  . Colon cancer Cousin     Mother side-First cousin    Medications- reviewed and updated Current Outpatient Prescriptions  Medication Sig Dispense Refill  . acyclovir (ZOVIRAX) 200 MG capsule Take 1 capsule (200 mg  total) by mouth daily. 90 capsule 1  . aspirin 325 MG tablet Take 325 mg by mouth daily.      Marland Kitchen  atorvastatin (LIPITOR) 20 MG tablet Take 1 tablet (20 mg total) by mouth daily. 90 tablet 1  . losartan (COZAAR) 50 MG tablet Take 1 and 1/2 tablets by   mouth in the morning 135 tablet 1  . metoprolol succinate (TOPROL-XL) 50 MG 24 hr tablet TAKE 1 TABLET (50 MG TOTAL) BY MOUTH DAILY. 100 tablet 2  . nitroGLYCERIN (NITROSTAT) 0.4 MG SL tablet Place 1 tablet (0.4 mg total) under the tongue every 5 (five) minutes as  needed. 25 tablet 1  . Omega-3 Fatty Acids (FISH OIL) 1000 MG CAPS Take by mouth.    Marland Kitchen OVER THE COUNTER MEDICATION Centrum Silver Men's 50 plus    . sildenafil (VIAGRA) 100 MG tablet TAKE 1 TABLET BY MOUTH DAILY AS NEEDED 10 tablet 11   No current facility-administered medications for this visit.    Allergies-reviewed and updated No Known Allergies  Social History   Social History  . Marital Status: Married    Spouse Name: N/A  . Number of Children: N/A  . Years of Education: N/A   Social History Main Topics  . Smoking status: Never Smoker   . Smokeless tobacco: Never Used  . Alcohol Use: Yes     Comment: one daily   . Drug Use: No  . Sexual Activity: Not Asked   Other Topics Concern  . None   Social History Narrative   Daily caffeine     Objective: BP 110/70 mmHg  Temp(Src) 97.8 F (36.6 C) (Oral)  Ht 5\' 10"  (1.778 m)  Wt 185 lb 11.2 oz (84.233 kg)  BMI 26.65 kg/m2 Physical Exam Nursing note and vitals reviewed. Constitutional: He is oriented to person, place, and time. He appears well-developed and well-nourished.  HENT:  Head: Normocephalic and atraumatic.  Right Ear: External ear normal.  Left Ear: External ear normal. Mild cerumen impaction.  Nose: Nose normal.  Mouth/Throat: Oropharynx is clear and moist.  Eyes: Conjunctivae and EOM are normal. Pupils are equal, round, and reactive to light.  Neck: Normal range of motion. Neck supple. No JVD present. No tracheal deviation present. No thyromegaly present.  Cardiovascular: Normal rate, regular rhythm, normal heart sounds and intact distal pulses. Exam reveals no gallop and no friction rub.  No murmur heard. No carotid artery bruits for pulses 2+ and symmetrical  Pulmonary/Chest: Effort normal and breath sounds normal. No stridor. No respiratory distress. He has no wheezes. He has no rales. He exhibits no tenderness.  Abdominal: Soft. Bowel sounds are normal. He exhibits no distension and no mass. There  is no tenderness. There is no rebound and no guarding.  Genitourinary: Deferred by patient.  Musculoskeletal: Normal range of motion. He exhibits no edema and no tenderness.  Lymphadenopathy:   He has no cervical adenopathy.  Neurological: He is alert and oriented to person, place, and time. He has normal reflexes. No cranial nerve deficit. He exhibits normal muscle tone.  Skin: Skin is warm and dry. No rash noted. No erythema. No pallor.  Well-healed scar midline chest from previous bypass surgery otherwise skin exam normal  Psychiatric: He has a normal mood and affect. His behavior is normal. Judgment and thought content normal.   Assessment/Plan:  1. Routine general medical examination at a health care facility - Follow up in one year for CPE - Follow up sooner if needed.  - Continue to eat healthy and exercise.  - Will follow up on labs  2. Essential hypertension - No change in medication.  - metoprolol succinate (  TOPROL-XL) 50 MG 24 hr tablet; TAKE 1 TABLET (50 MG TOTAL) BY MOUTH DAILY.  Dispense: 100 tablet; Refill: 2 - losartan (COZAAR) 50 MG tablet; Take 1 and 1/2 tablets by   mouth in the morning  Dispense: 135 tablet; Refill: 1 - CBC - Hepatic function panel - EKG 12-Lead Rate 70, RBBB - no change from previous - Lipid panel - POCT urinalysis dipstick - TSH  3. HYPERCHOLESTEROLEMIA - No Change.  - atorvastatin (LIPITOR) 20 MG tablet; Take 1 tablet (20 mg total) by mouth daily.  Dispense: 90 tablet; Refill: 1 - Basic metabolic panel - CBC - Hepatic function panel - EKG 12-Lead - Lipid panel - POCT urinalysis dipstick - TSH  4. ERECTILE DYSFUNCTION - sildenafil (VIAGRA) 100 MG tablet; TAKE 1 TABLET BY MOUTH DAILY AS NEEDED  Dispense: 10 tablet; Refill: 11  5. Medication refill - metoprolol succinate (TOPROL-XL) 50 MG 24 hr tablet; TAKE 1 TABLET (50 MG TOTAL) BY MOUTH DAILY.  Dispense: 100 tablet; Refill: 2 - losartan (COZAAR) 50 MG tablet; Take 1 and 1/2 tablets  by   mouth in the morning  Dispense: 135 tablet; Refill: 1 - atorvastatin (LIPITOR) 20 MG tablet; Take 1 tablet (20 mg total) by mouth daily.  Dispense: 90 tablet; Refill: 1 - acyclovir (ZOVIRAX) 200 MG capsule; Take 1 capsule (200 mg  total) by mouth daily.  Dispense: 90 capsule; Refill: 1 - sildenafil (VIAGRA) 100 MG tablet; TAKE 1 TABLET BY MOUTH DAILY AS NEEDED  Dispense: 10 tablet; Refill: 11 - nitroGLYCERIN (NITROSTAT) 0.4 MG SL tablet; Place 1 tablet (0.4 mg total) under the tongue every 5 (five) minutes as needed.  Dispense: 25 tablet; Refill: 1   Return precautions advised.

## 2014-08-20 NOTE — Patient Instructions (Signed)
It was great meeting you today!  I will follow up with you regarding your blood work.   Follow up in one year for a complete physical or sooner if needed.   Continue to exercise and eat healthy.

## 2014-09-01 DIAGNOSIS — K0826 Severe atrophy of the maxilla: Secondary | ICD-10-CM | POA: Diagnosis not present

## 2014-12-17 DIAGNOSIS — H2513 Age-related nuclear cataract, bilateral: Secondary | ICD-10-CM | POA: Diagnosis not present

## 2015-02-14 ENCOUNTER — Other Ambulatory Visit: Payer: Self-pay | Admitting: Adult Health

## 2015-02-25 ENCOUNTER — Ambulatory Visit (INDEPENDENT_AMBULATORY_CARE_PROVIDER_SITE_OTHER): Payer: Medicare Other | Admitting: Adult Health

## 2015-02-25 ENCOUNTER — Encounter: Payer: Self-pay | Admitting: Adult Health

## 2015-02-25 VITALS — BP 110/60 | Temp 97.9°F | Ht 70.0 in | Wt 193.6 lb

## 2015-02-25 DIAGNOSIS — N529 Male erectile dysfunction, unspecified: Secondary | ICD-10-CM | POA: Diagnosis not present

## 2015-02-25 DIAGNOSIS — L299 Pruritus, unspecified: Secondary | ICD-10-CM

## 2015-02-25 NOTE — Progress Notes (Signed)
Subjective:    Patient ID: William York, male    DOB: 01/17/1940, 75 y.o.   MRN: [id number]  HPI  75 year old male who presents to the office today to discuss an " itchy spot on my scalp" x 6 weeks as well as issues with  erectile dysfunction.   1) He has noticed a " lump" that is itchy on the back left of his scalp. He first noticed this 6 weeks ago.   2) He is using Viagra but has noticed that it is no longer working as well as it used to. He is interesting in using cialsis  Review of Systems  Constitutional: Negative.   Respiratory: Negative.   Cardiovascular: Negative.   Skin:       "lump"  All other systems reviewed and are negative.  Past Medical History  Diagnosis Date  . Allergy   . Hyperlipidemia   . Hypertension   . ED (erectile dysfunction)   . CAD (coronary artery disease)   . OA (osteoarthritis)   . Internal hemorrhoids     Social History   Social History  . Marital Status: Married    Spouse Name: N/A  . Number of Children: N/A  . Years of Education: N/A   Occupational History  . Not on file.   Social History Main Topics  . Smoking status: Never Smoker   . Smokeless tobacco: Never Used  . Alcohol Use: Yes     Comment: one daily   . Drug Use: No  . Sexual Activity: Not on file   Other Topics Concern  . Not on file   Social History Narrative   Daily caffeine     Past Surgical History  Procedure Laterality Date  . Coronary artery bypass graft  June 1999  . Hernia repair  May 2006    Family History  Problem Relation Age of Onset  . Colon cancer Cousin     Mother side-First cousin    No Known Allergies  Current Outpatient Prescriptions on File Prior to Visit  Medication Sig Dispense Refill  . acyclovir (ZOVIRAX) 200 MG capsule Take 1 capsule (200 mg  total) by mouth daily. 90 capsule 1  . aspirin 325 MG tablet Take 325 mg by mouth daily.      Marland Kitchen atorvastatin (LIPITOR) 20 MG tablet Take 1 tablet by mouth  daily 90 tablet 2  .  losartan (COZAAR) 50 MG tablet TAKE 1 AND 1/2 TABLETS BY  MOUTH IN THE MORNING 135 tablet 2  . metoprolol succinate (TOPROL-XL) 50 MG 24 hr tablet TAKE 1 TABLET (50 MG TOTAL) BY MOUTH DAILY. 100 tablet 2  . nitroGLYCERIN (NITROSTAT) 0.4 MG SL tablet Place 1 tablet (0.4 mg total) under the tongue every 5 (five) minutes as needed. 25 tablet 1  . Omega-3 Fatty Acids (FISH OIL) 1000 MG CAPS Take by mouth.    Marland Kitchen OVER THE COUNTER MEDICATION Centrum Silver Men's 50 plus    . sildenafil (VIAGRA) 100 MG tablet TAKE 1 TABLET BY MOUTH DAILY AS NEEDED 10 tablet 11   No current facility-administered medications on file prior to visit.    BP 110/60 mmHg  Temp(Src) 97.9 F (36.6 C) (Oral)  Ht 5\' 10"  (1.778 m)  Wt 193 lb 9.6 oz (87.816 kg)  BMI 27.78 kg/m2       Objective:   Physical Exam  Constitutional: He is oriented to person, place, and time. He appears well-developed and well-nourished. No distress.  Neurological: He  is alert and oriented to person, place, and time.  Skin: Skin is warm and dry. He is not diaphoretic.  There is a spot of dry skin on the area that is in question. He also appears to have a small cyst. No signs of malignancy.   Psychiatric: He has a normal mood and affect. His behavior is normal. Judgment and thought content normal.  Vitals reviewed.      Assessment & Plan:  1. Erectile dysfunction, unspecified erectile dysfunction type - Testosterone, Free, Total, SHBG - Sample of cialis given to patient.  2. Itchy scalp - Apply skin moisturizer to area.  - Follow up if it continues or mass gets larger.

## 2015-02-25 NOTE — Patient Instructions (Signed)
It was great seeing you again!  Follow up for a lab draw. I will follow up with you regarding your blood work  Please let me know if you need anything

## 2015-02-25 NOTE — Progress Notes (Signed)
Pre visit review using our clinic review tool, if applicable. No additional management support is needed unless otherwise documented below in the visit note. 

## 2015-03-14 ENCOUNTER — Other Ambulatory Visit: Payer: Self-pay | Admitting: Adult Health

## 2015-03-17 ENCOUNTER — Other Ambulatory Visit: Payer: Medicare Other

## 2015-03-17 ENCOUNTER — Other Ambulatory Visit: Payer: Self-pay | Admitting: Adult Health

## 2015-03-17 DIAGNOSIS — N529 Male erectile dysfunction, unspecified: Secondary | ICD-10-CM | POA: Diagnosis not present

## 2015-03-23 ENCOUNTER — Telehealth: Payer: Self-pay | Admitting: Family Medicine

## 2015-03-23 NOTE — Telephone Encounter (Signed)
Sent a message to the lab to see if pt's labs have returned.

## 2015-03-23 NOTE — Telephone Encounter (Signed)
Pt would like a call back with his lab results. He said he had labs last Wed 03/17/15

## 2015-03-24 LAB — TESTOSTERONE, FREE AND TOTAL (INCLUDES SHBG)-(MALES)
Sex Hormone Binding: 35 nmol/L (ref 22–77)
Testosterone, Free: 46 pg/mL — ABNORMAL LOW (ref 47.0–244.0)
Testosterone-% Free: 1.9 % (ref 1.6–2.9)
Testosterone: 247 ng/dL — ABNORMAL LOW (ref 250–827)

## 2015-03-25 ENCOUNTER — Telehealth: Payer: Self-pay | Admitting: Adult Health

## 2015-03-25 DIAGNOSIS — R7989 Other specified abnormal findings of blood chemistry: Secondary | ICD-10-CM

## 2015-03-25 NOTE — Telephone Encounter (Signed)
Spoke to William York and informed him of his lab results. His testosterone is just on the low side of normal. Options were discussed and he would like to see Urology .

## 2015-04-13 DIAGNOSIS — Z85828 Personal history of other malignant neoplasm of skin: Secondary | ICD-10-CM | POA: Diagnosis not present

## 2015-04-13 DIAGNOSIS — L821 Other seborrheic keratosis: Secondary | ICD-10-CM | POA: Diagnosis not present

## 2015-04-13 DIAGNOSIS — L281 Prurigo nodularis: Secondary | ICD-10-CM | POA: Diagnosis not present

## 2015-04-13 DIAGNOSIS — L820 Inflamed seborrheic keratosis: Secondary | ICD-10-CM | POA: Diagnosis not present

## 2015-04-13 DIAGNOSIS — L570 Actinic keratosis: Secondary | ICD-10-CM | POA: Diagnosis not present

## 2015-04-28 DIAGNOSIS — Z0000 Encounter for general adult medical examination without abnormal findings: Secondary | ICD-10-CM | POA: Diagnosis not present

## 2015-04-28 DIAGNOSIS — E291 Testicular hypofunction: Secondary | ICD-10-CM | POA: Diagnosis not present

## 2015-05-26 DIAGNOSIS — N5201 Erectile dysfunction due to arterial insufficiency: Secondary | ICD-10-CM | POA: Diagnosis not present

## 2015-07-23 ENCOUNTER — Encounter: Payer: Self-pay | Admitting: Family Medicine

## 2015-07-23 ENCOUNTER — Ambulatory Visit (INDEPENDENT_AMBULATORY_CARE_PROVIDER_SITE_OTHER): Payer: Medicare Other | Admitting: Family Medicine

## 2015-07-23 VITALS — BP 150/80 | HR 70 | Temp 98.1°F | Resp 12 | Ht 70.0 in | Wt 189.5 lb

## 2015-07-23 DIAGNOSIS — R197 Diarrhea, unspecified: Secondary | ICD-10-CM | POA: Diagnosis not present

## 2015-07-23 DIAGNOSIS — I10 Essential (primary) hypertension: Secondary | ICD-10-CM

## 2015-07-23 LAB — BASIC METABOLIC PANEL
BUN: 15 mg/dL (ref 6–23)
CO2: 25 mEq/L (ref 19–32)
Calcium: 9.6 mg/dL (ref 8.4–10.5)
Chloride: 105 mEq/L (ref 96–112)
Creatinine, Ser: 0.82 mg/dL (ref 0.40–1.50)
GFR: 97.49 mL/min (ref >60.00)
Glucose, Bld: 88 mg/dL (ref 70–99)
Potassium: 4.3 mEq/L (ref 3.5–5.1)
Sodium: 140 mEq/L (ref 135–145)

## 2015-07-23 LAB — TSH: TSH: 1.07 u[IU]/mL (ref 0.35–4.50)

## 2015-07-23 NOTE — Progress Notes (Signed)
Pre visit review using our clinic review tool, if applicable. No additional management support is needed unless otherwise documented below in the visit note. 

## 2015-07-23 NOTE — Patient Instructions (Signed)
A few things to remember from today's visit:   Acute diarrhea - Plan: C difficile Toxins A+B W/Rflx, Basic metabolic panel, TSH  Essential hypertension  No changes on blood pressure medication, monitor blood pressure at home. Over-the-counter probiotic recommended,Align 1 cap daily.  Avoid food that can irritate her stomach. If severe abdominal pain, nausea vomiting or fever or any worrisome sign please seek immediate medical attention.   Please be sure medication list is accurate. If a new problem present, please set up appointment sooner than planned today.

## 2015-07-23 NOTE — Progress Notes (Signed)
HPI:  ACUTE VISIT:  Chief Complaint  Patient presents with  . stomach cramps    Started saturday/sunday. Pt was on clindamycin from dentist.  . Diarrhea    [name] is a 75 y.o. male, who is here today complaining of acute onset of diarrhea on 07/17/2015.  Stools are soft, no red blood noted, on 07/21/2015 one stool was "very black". No mucus, today he has had 3 stools, yesterday he had 4-5. He also has had intermittently abdominal cramps, periumbilical, mild, alleviated by defecation.  He has not had chills, fever, decreased appetite, myalgias, skin rash, or urinary symptoms.  On 06/14/2015 he was placed on Clindamycin to treat dental infection, he took it for 3 days and had to discontinue because diarrhea and abdominal cramps. According to patient, antibiotic was changed to Doxycycline, which he took daily for 18 days.   No nausea or vomiting.  No recent travel. No sick contact. No suspicious food intake. No insect bite or swimming in rivers/lakes.    He has tried OTC medication. Symptoms are otherwise stable.   Lab Results  Component Value Date   TSH 0.91 08/20/2014    Hypertension: His BP elevated today, he is currently on losartan 50 mg daily and metoprolol succinate 50 mg daily.  Denies severe/frequent headache, visual changes, chest pain, dyspnea, palpitation, claudication, focal weakness, or edema.  Reviewing labs he had a elevated K+ in 08/2014, he has not followed since then.  Lab Results  Component Value Date   CREATININE 0.98 08/20/2014   BUN 23 08/20/2014   NA 140 08/20/2014   K 5.4* 08/20/2014   CL 103 08/20/2014   CO2 27 08/20/2014      Review of Systems  Constitutional: Negative for fever, appetite change, fatigue and unexpected weight change.  HENT: Negative for facial swelling, mouth sores, nosebleeds and trouble swallowing.   Eyes: Negative for redness and visual disturbance.  Respiratory: Negative for cough, shortness  of breath and wheezing.   Cardiovascular: Negative for palpitations and leg swelling.  Gastrointestinal: Positive for abdominal pain and diarrhea. Negative for nausea, vomiting and blood in stool.  Genitourinary: Negative for dysuria, hematuria and decreased urine volume. Penile pain: .bjcv.  Musculoskeletal: Negative for myalgias and back pain.  Skin: Negative for pallor and rash.  Neurological: Negative for syncope, weakness and headaches.  Psychiatric/Behavioral: Negative for confusion. The patient is not nervous/anxious.       Current Outpatient Prescriptions on File Prior to Visit  Medication Sig Dispense Refill  . acyclovir (ZOVIRAX) 200 MG capsule Take 1 capsule by mouth  daily 90 capsule 1  . aspirin 325 MG tablet Take 325 mg by mouth daily.      Marland Kitchen atorvastatin (LIPITOR) 20 MG tablet Take 1 tablet by mouth  daily 90 tablet 2  . losartan (COZAAR) 50 MG tablet TAKE 1 AND 1/2 TABLETS BY  MOUTH IN THE MORNING 135 tablet 2  . metoprolol succinate (TOPROL-XL) 50 MG 24 hr tablet TAKE 1 TABLET (50 MG TOTAL) BY MOUTH DAILY. 100 tablet 2  . nitroGLYCERIN (NITROSTAT) 0.4 MG SL tablet Place 1 tablet (0.4 mg total) under the tongue every 5 (five) minutes as needed. 25 tablet 1  . Omega-3 Fatty Acids (FISH OIL) 1000 MG CAPS Take by mouth.    Marland Kitchen OVER THE COUNTER MEDICATION Centrum Silver Men's 50 plus     No current facility-administered medications on file prior to visit.     Past Medical History  Diagnosis Date  .  Allergy   . Hyperlipidemia   . Hypertension   . ED (erectile dysfunction)   . CAD (coronary artery disease)   . OA (osteoarthritis)   . Internal hemorrhoids    No Known Allergies  Social History   Social History  . Marital Status: Married    Spouse Name: N/A  . Number of Children: N/A  . Years of Education: N/A   Social History Main Topics  . Smoking status: Never Smoker   . Smokeless tobacco: Never Used  . Alcohol Use: Yes     Comment: one daily   . Drug Use:  No  . Sexual Activity: Not Asked   Other Topics Concern  . None   Social History Narrative   Daily caffeine     Filed Vitals:   07/23/15 1236  BP: 150/80  Pulse: 70  Temp: 98.1 F (36.7 C)  Resp: 12   Body mass index is 27.19 kg/(m^2).   SpO2 Readings from Last 3 Encounters:  07/23/15 98%  07/22/14 93%      Physical Exam  Nursing note and vitals reviewed. Constitutional: He is oriented to person, place, and time. He appears well-developed. No distress.  HENT:  Head: Atraumatic.  Mouth/Throat: Oropharynx is clear and moist and mucous membranes are normal.  Eyes: Conjunctivae and EOM are normal.  Neck: No thyromegaly present.  Cardiovascular: Normal rate and regular rhythm.   ? Soft SEM RUSB. DP pulses present bilateral.  Respiratory: Effort normal and breath sounds normal. No respiratory distress.  GI: Soft. He exhibits no mass. Bowel sounds are increased. There is no hepatomegaly. There is no tenderness.  Musculoskeletal: He exhibits no edema.  Neurological: He is alert and oriented to person, place, and time. He has normal strength. Coordination and gait normal.  Skin: Skin is warm. No erythema.  Psychiatric: He has a normal mood and affect.  Well groomed, good eye contact.      ASSESSMENT AND PLAN:     [place] was seen today for stomach cramps and diarrhea.  Diagnoses and all orders for this visit:  Acute diarrhea  We discussed possible etiologies, given the fact he has been on antibiotics recently and for long time, one of those Clindamycin, it is warranted to check for C. difficile infection. He will start OTC daily probiotic as instructed to avoid food that can irritate GI, bland diet is preferable. He had colonoscopy in July 2016, negative. He will monitor for other episode of "black stool", in which case further workup may be needed. Clearly instructed about warning signs.  -     C difficile Toxins A+B W/Rflx -     Basic metabolic panel -      TSH  Essential hypertension  SBP mildly elevated, for now no changes in current management. Instructed to continue monitoring BP at home. He has an appointment in August 2017, which he will keep.   -     Basic metabolic panel        -Mr.[name] advised to return or notify a doctor immediately if symptoms worsen or persist or new concerns arise, he voices understanding.       Shaneca Orne G. Martinique, MD  [facility]. [place] office.

## 2015-07-26 ENCOUNTER — Encounter: Payer: Self-pay | Admitting: Family Medicine

## 2015-07-27 DIAGNOSIS — N5201 Erectile dysfunction due to arterial insufficiency: Secondary | ICD-10-CM | POA: Diagnosis not present

## 2015-07-30 DIAGNOSIS — H0015 Chalazion left lower eyelid: Secondary | ICD-10-CM | POA: Diagnosis not present

## 2015-08-02 ENCOUNTER — Telehealth: Payer: Self-pay | Admitting: Family Medicine

## 2015-08-02 ENCOUNTER — Other Ambulatory Visit: Payer: Self-pay | Admitting: Adult Health

## 2015-08-02 DIAGNOSIS — Z760 Encounter for issue of repeat prescription: Secondary | ICD-10-CM

## 2015-08-02 DIAGNOSIS — I10 Essential (primary) hypertension: Secondary | ICD-10-CM

## 2015-08-02 NOTE — Telephone Encounter (Signed)
Pt would like results of stool card

## 2015-08-02 NOTE — Telephone Encounter (Signed)
Called and informed patient that stool card came back negative.

## 2015-08-03 NOTE — Telephone Encounter (Signed)
Rx sent to pharmacy   

## 2015-08-03 NOTE — Telephone Encounter (Signed)
Looks like this is a Dr. Martinique patient

## 2015-08-06 DIAGNOSIS — H0015 Chalazion left lower eyelid: Secondary | ICD-10-CM | POA: Diagnosis not present

## 2015-08-17 DIAGNOSIS — H0015 Chalazion left lower eyelid: Secondary | ICD-10-CM | POA: Diagnosis not present

## 2015-08-19 ENCOUNTER — Other Ambulatory Visit (INDEPENDENT_AMBULATORY_CARE_PROVIDER_SITE_OTHER): Payer: Medicare Other

## 2015-08-19 DIAGNOSIS — Z0000 Encounter for general adult medical examination without abnormal findings: Secondary | ICD-10-CM

## 2015-08-19 LAB — HEPATIC FUNCTION PANEL
ALT: 24 U/L (ref 0–53)
AST: 22 U/L (ref 0–37)
Albumin: 4.5 g/dL (ref 3.5–5.2)
Alkaline Phosphatase: 76 U/L (ref 39–117)
Bilirubin, Direct: 0.2 mg/dL (ref 0.0–0.3)
Total Bilirubin: 0.8 mg/dL (ref 0.2–1.2)
Total Protein: 7 g/dL (ref 6.0–8.3)

## 2015-08-19 LAB — POC URINALSYSI DIPSTICK (AUTOMATED)
Bilirubin, UA: NEGATIVE
Blood, UA: NEGATIVE
Glucose, UA: NEGATIVE
Ketones, UA: NEGATIVE
Leukocytes, UA: NEGATIVE
Nitrite, UA: NEGATIVE
Protein, UA: NEGATIVE
Spec Grav, UA: 1.02
Urobilinogen, UA: 0.2
pH, UA: 6

## 2015-08-19 LAB — LIPID PANEL
Cholesterol: 147 mg/dL (ref 0–200)
HDL: 67.2 mg/dL (ref >39.00)
LDL Cholesterol: 60 mg/dL (ref 0–99)
NonHDL: 79.43
Total CHOL/HDL Ratio: 2
Triglycerides: 98 mg/dL (ref 0.0–149.0)
VLDL: 19.6 mg/dL (ref 0.0–40.0)

## 2015-08-19 LAB — BASIC METABOLIC PANEL
BUN: 20 mg/dL (ref 6–23)
CO2: 28 mEq/L (ref 19–32)
Calcium: 9.6 mg/dL (ref 8.4–10.5)
Chloride: 105 mEq/L (ref 96–112)
Creatinine, Ser: 0.83 mg/dL (ref 0.40–1.50)
GFR: 96.12 mL/min (ref >60.00)
Glucose, Bld: 95 mg/dL (ref 70–99)
Potassium: 4.6 mEq/L (ref 3.5–5.1)
Sodium: 142 mEq/L (ref 135–145)

## 2015-08-19 LAB — CBC WITH DIFFERENTIAL/PLATELET
Basophils Absolute: 0 10*3/uL (ref 0.0–0.1)
Basophils Relative: 0.3 % (ref 0.0–3.0)
Eosinophils Absolute: 0.2 10*3/uL (ref 0.0–0.7)
Eosinophils Relative: 3.3 % (ref 0.0–5.0)
HCT: 45 % (ref 39.0–52.0)
Hemoglobin: 15.4 g/dL (ref 13.0–17.0)
Lymphocytes Relative: 24.8 % (ref 12.0–46.0)
Lymphs Abs: 1.8 10*3/uL (ref 0.7–4.0)
MCHC: 34.2 g/dL (ref 30.0–36.0)
MCV: 92.8 fl (ref 78.0–100.0)
Monocytes Absolute: 0.6 10*3/uL (ref 0.1–1.0)
Monocytes Relative: 8.7 % (ref 3.0–12.0)
Neutro Abs: 4.6 10*3/uL (ref 1.4–7.7)
Neutrophils Relative %: 62.9 % (ref 43.0–77.0)
Platelets: 218 10*3/uL (ref 150.0–400.0)
RBC: 4.84 Mil/uL (ref 4.22–5.81)
RDW: 12.7 % (ref 11.5–15.5)
WBC: 7.2 10*3/uL (ref 4.0–10.5)

## 2015-08-19 LAB — PSA: PSA: 0.77 ng/mL (ref 0.10–4.00)

## 2015-08-19 LAB — TSH: TSH: 1.71 u[IU]/mL (ref 0.35–4.50)

## 2015-08-23 ENCOUNTER — Ambulatory Visit (INDEPENDENT_AMBULATORY_CARE_PROVIDER_SITE_OTHER): Payer: Medicare Other | Admitting: Family Medicine

## 2015-08-23 ENCOUNTER — Encounter: Payer: Self-pay | Admitting: Family Medicine

## 2015-08-23 VITALS — BP 138/60 | HR 73 | Resp 12 | Ht 70.0 in | Wt 191.1 lb

## 2015-08-23 DIAGNOSIS — I10 Essential (primary) hypertension: Secondary | ICD-10-CM

## 2015-08-23 DIAGNOSIS — E7800 Pure hypercholesterolemia, unspecified: Secondary | ICD-10-CM | POA: Diagnosis not present

## 2015-08-23 DIAGNOSIS — B001 Herpesviral vesicular dermatitis: Secondary | ICD-10-CM | POA: Diagnosis not present

## 2015-08-23 DIAGNOSIS — I2510 Atherosclerotic heart disease of native coronary artery without angina pectoris: Secondary | ICD-10-CM

## 2015-08-23 DIAGNOSIS — Z0000 Encounter for general adult medical examination without abnormal findings: Secondary | ICD-10-CM

## 2015-08-23 MED ORDER — VALACYCLOVIR HCL 1 G PO TABS: ORAL_TABLET | ORAL | 3 refills | Status: DC

## 2015-08-23 MED ORDER — LOSARTAN POTASSIUM 50 MG PO TABS: ORAL_TABLET | ORAL | 2 refills | Status: DC

## 2015-08-23 MED ORDER — METOPROLOL SUCCINATE ER 50 MG PO TB24: 50.0000 mg | ORAL_TABLET | Freq: Every day | ORAL | 2 refills | Status: DC

## 2015-08-23 MED ORDER — NITROGLYCERIN 0.4 MG SL SUBL: 0.4000 mg | SUBLINGUAL_TABLET | SUBLINGUAL | 3 refills | Status: DC | PRN

## 2015-08-23 MED ORDER — ATORVASTATIN CALCIUM 20 MG PO TABS: 20.0000 mg | ORAL_TABLET | Freq: Every day | ORAL | 3 refills | Status: DC

## 2015-08-23 NOTE — Patient Instructions (Addendum)
  Health Maintenance, Male   At least 150 minutes of moderate exercise per week, daily brisk walking for 15-30 min is a good exercise option. Healthy diet low in saturated (animal) fats and sweets and consisting of fresh fruits and vegetables, lean meats such as fish and white chicken and whole grains.  - Vaccines:  Tdap vaccine every 10 years.  Shingles vaccine recommended at age 74, could be given after 74 years of age but not sure about insurance coverage.  Pneumonia vaccines:  Prevnar 26 at 49 and Pneumovax at 69.   -Screening recommendations for low/normal risk males:  Screening for diabetes at age 54-45 and every 3 years.    Colonoscopy for colon cancer screening at age 60 and until age 74.  Prostate cancer screening: some controversy.  A few tips:  -As we age balance is not as good as it was, so there is a higher risks for falls. Please remove small rugs and furniture that is "in your way" and could increase the risk of falls. Stretching exercises may help with fall prevention: Yoga and Tai Chi are some examples. Low impact exercise is better, so you are not very achy the next day.  -Sun screen and avoidance of direct sun light recommended. Caution with dehydration, if working outdoors be sure to drink enough fluids.  - Some medications are not safe as we age, increases the risk of side effects and can potentially interact with other medication you are also taken;  including some of over the counter medications. Be sure to let me know when you start a new medication even if it is a dietary/vitamin supplement.   -Healthy diet low in red meet/animal fat and sugar + regular physical activity is recommended.

## 2015-08-23 NOTE — Progress Notes (Signed)
Pre visit review using our clinic review tool, if applicable. No additional management support is needed unless otherwise documented below in the visit note. 

## 2015-08-23 NOTE — Progress Notes (Signed)
HPI:  [name] is a 75 y.o.male here today for his routine physical examination.  -Concerns and/or follow up today: None. He needs refill on medications.  -Chronic medical problems: HTN,CAD,HLD, and ED.  Currently he is on oral Abx treatment, Augmentin, prescribed by his eye care provider to treat right upper eye lid edema and erythema. He is supposed to complete 7 days and then continue with Minocycline 50 mg daily for 30 more days. I saw him last on 07/23/2015 when he was complaining of diarrhea, at that time he had just completed treatment with Clindamycin, diarrhea has resolved,C. Diff was negative.  He follows a healthy diet and exercises regularly.  Hx of STD's denies.   Last colon cancer screening: 07/2014. Last prostate ca screening: 08/2015.   -Denies high alcohol intake, tobacco use, illicit drug use.  He had labs done recently, 08/19/15.   HLD: He is on Lipitor 20 mg daily.  Lab Results  Component Value Date   CHOL 147 08/19/2015   HDL 67.20 08/19/2015   LDLCALC 60 08/19/2015   TRIG 98.0 08/19/2015   CHOLHDL 2 08/19/2015   Lab Results  Component Value Date   ALT 24 08/19/2015   AST 22 08/19/2015   ALKPHOS 76 08/19/2015   BILITOT 0.8 08/19/2015    Hypertension: Currently he is on Metoprolol Succinate and Cozaar 50 mg daily. He is taking medications as instructed, no side effects reported.  He has not noted unusual headache, visual changes, exertional chest pain, dyspnea,  focal weakness, or edema.  Lab Results  Component Value Date   CREATININE 0.83 08/19/2015   BUN 20 08/19/2015   NA 142 08/19/2015   K 4.6 08/19/2015   CL 105 08/19/2015   CO2 28 08/19/2015   Lab Results  Component Value Date   WBC 7.2 08/19/2015   HGB 15.4 08/19/2015   HCT 45.0 08/19/2015   MCV 92.8 08/19/2015   PLT 218.0 08/19/2015   + CAD, has not needed Nitroglycerine in many years, still would like to have a Rx because lat one expired.   -Also  requesting refill for Acyclovir 200 mg, which he has taken daily for years to prevent mouth sores. He used to get monthly episodes. He follows with urology for ED,dermatology for skin check (no Hx of skin cancer), and eye care provider.  Lives with wife. Independent ADL's and IADL's.  No falls in the past year. Denies depression. Hx of mild bilateral hearing loss, stable, does not interfere with his daily social interactions.    Review of Systems  Constitutional: Negative for activity change, appetite change, fatigue, fever and unexpected weight change.  HENT: Positive for hearing loss (stable.). Negative for dental problem, ear pain, mouth sores, nosebleeds, sore throat, trouble swallowing and voice change.   Eyes: Negative for redness and visual disturbance.  Respiratory: Negative for apnea, cough, shortness of breath and wheezing.   Cardiovascular: Negative for chest pain, palpitations and leg swelling.  Gastrointestinal: Negative for abdominal pain, blood in stool, nausea and vomiting.       No changes in bowel habits.  Endocrine: Negative for polydipsia and polyuria.  Genitourinary: Negative for decreased urine volume, dysuria, frequency, genital sores, hematuria, testicular pain and urgency.  Musculoskeletal: Positive for arthralgias. Negative for back pain, gait problem and myalgias.  Skin: Negative for color change and rash.  Neurological: Negative for dizziness, seizures, syncope, weakness, numbness and headaches.  Hematological: Negative for adenopathy. Does not bruise/bleed easily.  Psychiatric/Behavioral: Negative  for confusion and sleep disturbance. The patient is not nervous/anxious.      Current Outpatient Prescriptions on File Prior to Visit  Medication Sig Dispense Refill  . aspirin 325 MG tablet Take 325 mg by mouth daily.       No current facility-administered medications on file prior to visit.      Past Medical History:  Diagnosis Date  . Allergy   . CAD  (coronary artery disease)   . ED (erectile dysfunction)   . Hyperlipidemia   . Hypertension   . Internal hemorrhoids   . OA (osteoarthritis)     No Known Allergies  Family History  Problem Relation Age of Onset  . Colon cancer Cousin     Mother side-First cousin  . Stroke Father     Social History   Social History  . Marital status: Married    Spouse name: N/A  . Number of children: N/A  . Years of education: N/A   Social History Main Topics  . Smoking status: Never Smoker  . Smokeless tobacco: Never Used  . Alcohol use Yes     Comment: one daily   . Drug use: No  . Sexual activity: Not Asked   Other Topics Concern  . None   Social History Narrative   Daily caffeine      Vitals:   08/23/15 0754  BP: 138/60  Pulse: 73  Resp: 12   Body mass index is 27.42 kg/m.  O2 sat 96% at RA.  Wt Readings from Last 3 Encounters:  08/23/15 191 lb 2 oz (86.7 kg)  07/23/15 189 lb 8 oz (86 kg)  02/25/15 193 lb 9.6 oz (87.8 kg)      Physical Exam  Nursing note and vitals reviewed. Constitutional: He is oriented to person, place, and time. He appears well-developed and well-nourished. No distress.  HENT:  Head: Atraumatic.  Right Ear: Hearing, tympanic membrane, external ear and ear canal normal.  Left Ear: Hearing, tympanic membrane, external ear and ear canal normal.  Mouth/Throat: Oropharynx is clear and moist and mucous membranes are normal.  Eyes: Conjunctivae and EOM are normal. Pupils are equal, round, and reactive to light. Right eye exhibits hordeolum.  Right upper eye lid mild erythema.  Neck: Normal range of motion. No thyromegaly present.  Cardiovascular: Normal rate and regular rhythm.   No murmur heard. Pulses:      Dorsalis pedis pulses are 2+ on the right side, and 2+ on the left side.  Respiratory: Effort normal and breath sounds normal. No respiratory distress.  GI: Soft. He exhibits no mass. There is no hepatomegaly. There is no tenderness.    Genitourinary:  Genitourinary Comments: Refused,no concerns.  Musculoskeletal: He exhibits no edema or tenderness.  No major deformities appreciated and no signs of synovitis.  Lymphadenopathy:    He has no cervical adenopathy.       Right: No supraclavicular adenopathy present.       Left: No supraclavicular adenopathy present.  Neurological: He is alert and oriented to person, place, and time. He has normal strength. No cranial nerve deficit or sensory deficit. Coordination and gait normal.  Reflex Scores:      Bicep reflexes are 2+ on the right side and 2+ on the left side.      Patellar reflexes are 2+ on the right side and 2+ on the left side. Skin: Skin is warm. No rash noted. No erythema.  No suspicious lesions.  Psychiatric: He has a normal mood  and affect.  Well groomed, good eye contact.        ASSESSMENT AND PLAN:   Discussed the following assessment and plan:   [place] was seen today for annual exam.  Diagnoses and all orders for this visit:    Routine general medical examination at a health care facility   -Current preventive preventive guidelines discussed. Discussed the importance of a healthy diet and regular exercise for prevention of some chronic medical conditions.  Next routine physical in 1year.    Essential hypertension  Adequately controlled. No changes in current management. Low salt diet recommended. Eye exam recommended annually. F/U in 6 months, before if needed.   -     losartan (COZAAR) 50 MG tablet; TAKE 1 AND 1/2 TABLETS BY  MOUTH IN THE MORNING -     metoprolol succinate (TOPROL-XL) 50 MG 24 hr tablet; Take 1 tablet (50 mg total) by mouth daily. Take with or immediately following a meal.  HYPERCHOLESTEROLEMIA  Stable. No changes in current management. Low fat diet to continue. F/U in 12 months.  -     atorvastatin (LIPITOR) 20 MG tablet; Take 1 tablet (20 mg total) by mouth daily.  Recurrent herpes labialis  He agrees  with trying Valtrex as needed. He will let me know through MyChart if it helps, if not we will consider going back to daily Acyclovir. F/U in 12 months.  -     valACYclovir (VALTREX) 1000 MG tablet; Take 2 tabs upon acute onset and repeat dose in 12 hours.  Coronary artery disease involving native coronary artery of native heart without angina pectoris  Asymptomatic. Continue Aspirin 325 mg daily, some side effects discussed. Continue BB and statin. F/U in 12 months, before if needed.   -     nitroGLYCERIN (NITROSTAT) 0.4 MG SL tablet; Place 1 tablet (0.4 mg total) under the tongue every 5 (five) minutes as needed.      -Some side effects of frequent abx use discussed.   He will start back on daily Probiotic.    Return in 6 months (on 02/23/2016) for HTN.       [name]. Martinique, MD  [facility]. [place] office.

## 2015-08-25 ENCOUNTER — Encounter: Payer: Medicare Other | Admitting: Family Medicine

## 2015-09-21 ENCOUNTER — Other Ambulatory Visit: Payer: Medicare Other

## 2015-09-28 ENCOUNTER — Encounter: Payer: Medicare Other | Admitting: Family Medicine

## 2015-10-22 DIAGNOSIS — H0011 Chalazion right upper eyelid: Secondary | ICD-10-CM | POA: Diagnosis not present

## 2015-10-22 DIAGNOSIS — H0015 Chalazion left lower eyelid: Secondary | ICD-10-CM | POA: Diagnosis not present

## 2015-11-15 ENCOUNTER — Encounter: Payer: Self-pay | Admitting: Family Medicine

## 2015-11-17 DIAGNOSIS — H0011 Chalazion right upper eyelid: Secondary | ICD-10-CM | POA: Diagnosis not present

## 2015-11-18 DIAGNOSIS — H0011 Chalazion right upper eyelid: Secondary | ICD-10-CM | POA: Diagnosis not present

## 2015-12-16 DIAGNOSIS — H25041 Posterior subcapsular polar age-related cataract, right eye: Secondary | ICD-10-CM | POA: Diagnosis not present

## 2016-01-11 DIAGNOSIS — H26133 Total traumatic cataract, bilateral: Secondary | ICD-10-CM | POA: Diagnosis not present

## 2016-02-18 DIAGNOSIS — L821 Other seborrheic keratosis: Secondary | ICD-10-CM | POA: Diagnosis not present

## 2016-02-18 DIAGNOSIS — Z85828 Personal history of other malignant neoplasm of skin: Secondary | ICD-10-CM | POA: Diagnosis not present

## 2016-02-18 DIAGNOSIS — C44722 Squamous cell carcinoma of skin of right lower limb, including hip: Secondary | ICD-10-CM | POA: Diagnosis not present

## 2016-02-22 ENCOUNTER — Ambulatory Visit: Payer: Medicare Other | Admitting: Family Medicine

## 2016-02-23 ENCOUNTER — Ambulatory Visit (INDEPENDENT_AMBULATORY_CARE_PROVIDER_SITE_OTHER): Payer: Medicare Other | Admitting: Family Medicine

## 2016-02-23 ENCOUNTER — Encounter: Payer: Self-pay | Admitting: Family Medicine

## 2016-02-23 DIAGNOSIS — I10 Essential (primary) hypertension: Secondary | ICD-10-CM

## 2016-02-23 LAB — BASIC METABOLIC PANEL
BUN: 20 mg/dL (ref 6–23)
CO2: 29 mEq/L (ref 19–32)
Calcium: 9.6 mg/dL (ref 8.4–10.5)
Chloride: 105 mEq/L (ref 96–112)
Creatinine, Ser: 0.95 mg/dL (ref 0.40–1.50)
GFR: 82.14 mL/min (ref >60.00)
Glucose, Bld: 136 mg/dL — ABNORMAL HIGH (ref 70–99)
Potassium: 4.6 mEq/L (ref 3.5–5.1)
Sodium: 140 mEq/L (ref 135–145)

## 2016-02-23 MED ORDER — METOPROLOL SUCCINATE ER 50 MG PO TB24: 50.0000 mg | ORAL_TABLET | Freq: Every day | ORAL | 2 refills | Status: DC

## 2016-02-23 MED ORDER — LOSARTAN POTASSIUM 50 MG PO TABS: ORAL_TABLET | ORAL | 2 refills | Status: DC

## 2016-02-23 NOTE — Progress Notes (Signed)
[name] is a 76 y.o.male, who is here today to follow on HTN.  Currently he is on Metoprolol Succinate 50 mg daily and Cozaar 75 mg daily. Hx of CAD. He is exercising 3 times per week and following a low salt diet. + HLD on Lipitor 20 mg. . He is taking medications as instructed, no side effects reported.  He has not noted unusual headache, visual changes, exertional chest pain, dyspnea,  focal weakness, or edema.  BP readings at home 130's/70-80's.   Lab Results  Component Value Date   CREATININE 0.83 08/19/2015   BUN 20 08/19/2015   NA 142 08/19/2015   K 4.6 08/19/2015   CL 105 08/19/2015   CO2 28 08/19/2015   Lab Results  Component Value Date   CHOL 147 08/19/2015   HDL 67.20 08/19/2015   LDLCALC 60 08/19/2015   TRIG 98.0 08/19/2015   CHOLHDL 2 08/19/2015      Review of Systems  Constitutional: Negative for activity change, appetite change, fatigue and fever.  HENT: Negative for mouth sores, nosebleeds, sore throat and trouble swallowing.   Eyes: Negative for pain, redness and visual disturbance.  Respiratory: Negative for cough, shortness of breath and wheezing.   Cardiovascular: Negative for chest pain, palpitations and leg swelling.  Gastrointestinal: Negative for abdominal pain, nausea and vomiting.       No changes in bowel habits.  Genitourinary: Negative for decreased urine volume and hematuria.  Skin: Negative for rash.  Neurological: Negative for dizziness, syncope, weakness, numbness and headaches.  Psychiatric/Behavioral: Negative for confusion.     Current Outpatient Prescriptions on File Prior to Visit  Medication Sig Dispense Refill  . aspirin 325 MG tablet Take 325 mg by mouth daily.      Marland Kitchen atorvastatin (LIPITOR) 20 MG tablet Take 1 tablet (20 mg total) by mouth daily. 90 tablet 3  . minocycline (MINOCIN,DYNACIN) 50 MG capsule     . nitroGLYCERIN (NITROSTAT) 0.4 MG SL tablet Place 1 tablet (0.4 mg total) under the tongue every 5  (five) minutes as needed. 25 tablet 3  . valACYclovir (VALTREX) 1000 MG tablet Take 2 tabs upon acute onset and repeat dose in 12 hours. 30 tablet 3   No current facility-administered medications on file prior to visit.      Past Medical History:  Diagnosis Date  . Allergy   . CAD (coronary artery disease)   . ED (erectile dysfunction)   . Hyperlipidemia   . Hypertension   . Internal hemorrhoids   . OA (osteoarthritis)     No Known Allergies  Social History   Social History  . Marital status: Married    Spouse name: N/A  . Number of children: N/A  . Years of education: N/A   Social History Main Topics  . Smoking status: Never Smoker  . Smokeless tobacco: Never Used  . Alcohol use Yes     Comment: one daily   . Drug use: No  . Sexual activity: Not Asked   Other Topics Concern  . None   Social History Narrative   Daily caffeine     Vitals:   02/23/16 0752  BP: 116/80  Pulse: 75  Resp: 12   Body mass index is 27.28 kg/m.  Physical Exam  Nursing note and vitals reviewed. Constitutional: He is oriented to person, place, and time. He appears well-developed and well-nourished. No distress.  HENT:  Head: Atraumatic.  Mouth/Throat: Oropharynx is clear and moist and mucous  membranes are normal.  Eyes: Conjunctivae and EOM are normal. Pupils are equal, round, and reactive to light.  Neck: No JVD present.  Cardiovascular: Normal rate and regular rhythm.   Murmur (SEM I/VI RUSB) heard. Pulses:      Dorsalis pedis pulses are 2+ on the right side, and 2+ on the left side.  Respiratory: Effort normal and breath sounds normal. No respiratory distress.  GI: Soft. He exhibits no mass. There is no hepatomegaly. There is no tenderness.  Musculoskeletal: He exhibits no edema.  Lymphadenopathy:    He has no cervical adenopathy.  Neurological: He is alert and oriented to person, place, and time. He has normal strength. Coordination and gait normal.  Skin: Skin is warm.  No erythema.  Psychiatric: He has a normal mood and affect. Cognition and memory are normal.  Well groomed, good eye contact.    ASSESSMENT AND PLAN:   William York was seen today for follow-up.  Diagnoses and all orders for this visit:  Essential hypertension -     metoprolol succinate (TOPROL-XL) 50 MG 24 hr tablet; Take 1 tablet (50 mg total) by mouth daily. Take with or immediately following a meal. -     losartan (COZAAR) 50 MG tablet; TAKE 1 AND 1/2 TABLETS BY  MOUTH IN THE MORNING -     Basic metabolic panel   Adequately controlled. No changes in current management. DASH-low salt diet recommended. Recommend monitor BP at home. Eye exam recommended annually. F/U in 6 months, before if needed.    -[name] was advised to return sooner than planned today if new concerns arise.     William York G. Martinique, MD  [facility]. [place] office.

## 2016-02-23 NOTE — Addendum Note (Signed)
Addended by: Kateri Mc E on: 02/23/2016 08:56 AM   Modules accepted: Orders

## 2016-02-23 NOTE — Patient Instructions (Signed)
A few things to remember from today's visit:   Essential hypertension - Plan: metoprolol succinate (TOPROL-XL) 50 MG 24 hr tablet, losartan (COZAAR) 50 MG tablet  Blood pressure goal for most people is less than 140/90.   Most recent cardiologists' recommendations recommend blood pressure at or less than 130/80.   Elevated blood pressure increases the risk of strokes, heart and kidney disease, and eye problems. Regular physical activity and a healthy diet (DASH diet) usually help. Low salt diet. Take medications as instructed.  Caution with some over the counter medications as cold medications, dietary products (for weight loss), and Ibuprofen or Aleve (frequent use);all these medications could cause elevation of blood pressure.    Please be sure medication list is accurate. If a new problem present, please set up appointment sooner than planned today.

## 2016-02-23 NOTE — Progress Notes (Signed)
Pre visit review using our clinic review tool, if applicable. No additional management support is needed unless otherwise documented below in the visit note. 

## 2016-02-24 ENCOUNTER — Encounter: Payer: Self-pay | Admitting: Family Medicine

## 2016-04-20 DIAGNOSIS — L404 Guttate psoriasis: Secondary | ICD-10-CM | POA: Diagnosis not present

## 2016-04-20 DIAGNOSIS — Z85828 Personal history of other malignant neoplasm of skin: Secondary | ICD-10-CM | POA: Diagnosis not present

## 2016-04-20 DIAGNOSIS — L821 Other seborrheic keratosis: Secondary | ICD-10-CM | POA: Diagnosis not present

## 2016-04-20 DIAGNOSIS — L812 Freckles: Secondary | ICD-10-CM | POA: Diagnosis not present

## 2016-06-08 DIAGNOSIS — L400 Psoriasis vulgaris: Secondary | ICD-10-CM | POA: Diagnosis not present

## 2016-06-08 DIAGNOSIS — L821 Other seborrheic keratosis: Secondary | ICD-10-CM | POA: Diagnosis not present

## 2016-06-08 DIAGNOSIS — Z85828 Personal history of other malignant neoplasm of skin: Secondary | ICD-10-CM | POA: Diagnosis not present

## 2016-07-13 DIAGNOSIS — L304 Erythema intertrigo: Secondary | ICD-10-CM | POA: Diagnosis not present

## 2016-07-25 ENCOUNTER — Encounter: Payer: Self-pay | Admitting: Family Medicine

## 2016-08-24 ENCOUNTER — Other Ambulatory Visit: Payer: Medicare Other

## 2016-08-31 ENCOUNTER — Encounter: Payer: Medicare Other | Admitting: Family Medicine

## 2016-09-01 ENCOUNTER — Encounter: Payer: Medicare Other | Admitting: Family Medicine

## 2016-09-04 NOTE — Progress Notes (Signed)
HPI:  [name] is a 76 y.o.male here today for his routine physical examination. He is also due for his Medicare subsequent preventive visit.   Last CPE on 08/23/15.  He lives with his wife.  Regular exercise 3 or more times per week: Yes Following a healthy diet: yes  Functional Status Survey: Is the patient deaf or have difficulty hearing?: Yes Does the patient have difficulty seeing, even when wearing glasses/contacts?: No Does the patient have difficulty concentrating, remembering, or making decisions?: No Does the patient have difficulty walking or climbing stairs?: No Does the patient have difficulty dressing or bathing?: No Does the patient have difficulty doing errands alone such as visiting a doctor's office or shopping?: No  Depression screen PHQ 2/9 09/05/2016  Decreased Interest 0  Down, Depressed, Hopeless 0  PHQ - 2 Score 0    Fall Risk  09/05/2016 08/23/2015 08/20/2014 07/24/2013  Falls in the past year? No No No No    Hearing Screening   125Hz  250Hz  500Hz  1000Hz  2000Hz  3000Hz  4000Hz  6000Hz  8000Hz   Right ear:   Fail Fail Fail  Fail    Left ear:   Fail Fail Fail  Fail      Visual Acuity Screening   Right eye Left eye Both eyes  Without correction:     With correction: 20/32 20/25 20/25         Mini-Cog - 09/05/16 0758    Normal clock drawing test? yes   How many words correct? 3      Chronic medical problems: HLD,CAD,HTN, recurrent herpes labialis, mild hearing loss, skin cancer (SCC), and ED among some. Denies headache, visual changes, chest pain, dyspnea, palpitation, claudication, focal weakness, or edema.   Providers he sees regularly: Dermatologists for annual skin check, Dr Ronnald Ramp.  Urologists, Dr Roselee Nova, last seen 02/2016. Because his urologists is retiring he saw new provider in 07/2016. Eye care provider: Dr Delman Cheadle, [facility]. Last visit 11/17/15. GI, Dr Olevia Perches    Immunization History  Administered Date(s) Administered    . Influenza Split 09/18/2012  . Influenza Whole 11/01/2006, 10/09/2008, 09/23/2011  . Influenza, High Dose Seasonal PF 10/07/2013  . Influenza-Unspecified 10/04/2014, 10/05/2015  . Pneumococcal Conjugate-13 10/07/2013  . Pneumococcal Polysaccharide-23 01/09/1997, 07/01/2004, 08/15/2012  . Td 06/26/2006  . Tdap 07/21/2010  . Zoster 09/23/2012    Last colon cancer screening: 07/2014 PSA 08/2015 was 0.77.Done in 07/2016 by urologists.  -Denies high alcohol intake, tobacco use, or Hx of illicit drug use.   She needs refill on Valtrex, which he takes as needed for recurrent herpes labialis. Last episode a few days ago, he reports having about 2-3 episodes per year. Problem has been stable overall.   Review of Systems  Constitutional: Negative for activity change, appetite change, diaphoresis, fatigue, fever and unexpected weight change.  HENT: Positive for hearing loss and mouth sores. Negative for dental problem, nosebleeds, sore throat, trouble swallowing and voice change.   Eyes: Negative for redness and visual disturbance.  Respiratory: Negative for apnea, cough, shortness of breath and wheezing.   Cardiovascular: Negative for chest pain, palpitations and leg swelling.  Gastrointestinal: Negative for abdominal pain, blood in stool, nausea and vomiting.       No changes in bowel habits.  Endocrine: Negative for cold intolerance, heat intolerance, polydipsia, polyphagia and polyuria.  Genitourinary: Negative for decreased urine volume, dysuria and hematuria.  Musculoskeletal: Negative for gait problem and myalgias.  Skin: Negative for rash.  Neurological: Negative for syncope, weakness and  headaches.  Hematological: Negative for adenopathy. Does not bruise/bleed easily.  Psychiatric/Behavioral: Negative for confusion and sleep disturbance. The patient is not nervous/anxious.   All other systems reviewed and are negative.    Current Outpatient Prescriptions on File Prior to Visit   Medication Sig Dispense Refill  . aspirin 325 MG tablet Take 325 mg by mouth daily.      . minocycline (MINOCIN,DYNACIN) 50 MG capsule      No current facility-administered medications on file prior to visit.      Past Medical History:  Diagnosis Date  . Allergy   . CAD (coronary artery disease)   . ED (erectile dysfunction)   . Hyperlipidemia   . Hypertension   . Internal hemorrhoids   . OA (osteoarthritis)    Past Surgical History:  Procedure Laterality Date  . CORONARY ARTERY BYPASS GRAFT  June 1999  . HERNIA REPAIR  May 2006    No Known Allergies  Family History  Problem Relation Age of Onset  . Colon cancer Cousin        Mother side-First cousin  . Stroke Father     Social History   Social History  . Marital status: Married    Spouse name: N/A  . Number of children: N/A  . Years of education: N/A   Social History Main Topics  . Smoking status: Never Smoker  . Smokeless tobacco: Never Used  . Alcohol use Yes     Comment: one daily   . Drug use: No  . Sexual activity: Not Asked   Other Topics Concern  . None   Social History Narrative   Daily caffeine     Vitals:   09/05/16 0723  BP: 136/70  Pulse: 74  Resp: 12  SpO2: 93%   Body mass index is 27.12 kg/m.   Wt Readings from Last 3 Encounters:  09/05/16 189 lb (85.7 kg)  02/23/16 190 lb 2 oz (86.2 kg)  08/23/15 191 lb 2 oz (86.7 kg)     Physical Exam  Nursing note and vitals reviewed. Constitutional: He is oriented to person, place, and time. He appears well-developed. No distress.  HENT:  Head: Atraumatic.  Right Ear: Tympanic membrane, external ear and ear canal normal. Decreased hearing is noted.  Left Ear: Tympanic membrane, external ear and ear canal normal. Decreased hearing is noted.  Mouth/Throat: Oropharynx is clear and moist and mucous membranes are normal.  Eyes: Pupils are equal, round, and reactive to light. Conjunctivae and EOM are normal.  Neck: Normal range of  motion. No JVD present. No tracheal deviation present. No thyromegaly (palpable.) present.  Cardiovascular: Normal rate and regular rhythm.   No murmur heard. Pulses:      Dorsalis pedis pulses are 2+ on the right side, and 2+ on the left side.  Respiratory: Effort normal and breath sounds normal. No respiratory distress.  GI: Soft. He exhibits no mass. There is no tenderness.  Genitourinary:  Genitourinary Comments: Deferred to urologists.  Musculoskeletal: He exhibits no edema or tenderness.  No major deformities appreciated and no signs of synovitis.  Lymphadenopathy:    He has no cervical adenopathy.       Right: No supraclavicular adenopathy present.       Left: No supraclavicular adenopathy present.  Neurological: He is alert and oriented to person, place, and time. He has normal strength. No cranial nerve deficit or sensory deficit. Coordination and gait normal.  Reflex Scores:      Bicep reflexes are 2+  on the right side and 2+ on the left side.      Patellar reflexes are 2+ on the right side and 2+ on the left side. Skin: Skin is warm. No rash noted. No erythema.  Psychiatric: He has a normal mood and affect. Cognition and memory are normal.     ASSESSMENT AND PLAN:   Mr. Colton was seen today for annual exam.  Diagnoses and all orders for this visit:    Chemistry      Component Value Date/Time   NA 141 09/05/2016 0826   K 4.4 09/05/2016 0826   CL 106 09/05/2016 0826   CO2 27 09/05/2016 0826   BUN 16 09/05/2016 0826   CREATININE 0.78 09/05/2016 0826      Component Value Date/Time   CALCIUM 9.6 09/05/2016 0826   ALKPHOS 64 09/05/2016 0826   AST 18 09/05/2016 0826   ALT 21 09/05/2016 0826   BILITOT 1.0 09/05/2016 0826     Lab Results  Component Value Date   HGBA1C 6.1 09/05/2016   Lab Results  Component Value Date   CHOL 142 09/05/2016   HDL 54.70 09/05/2016   LDLCALC 58 09/05/2016   TRIG 148.0 09/05/2016   CHOLHDL 3 09/05/2016    Medicare annual  wellness visit, subsequent  We discussed the importance of staying active, physically and mentally, as well as the benefits of a healthy/balnace diet. Low impact exercise that involve stretching and strengthing are ideal. We discussed preventive screening for the next 5-10 years, summery of recommendations given in AVS:  Annual eye exam to screen for glaucoma. Diabetes screening. Influenza vaccine annually. Prostate cancer screening to discuss annually with urologists.  Fall prevention.  Advance directives and end of life discussed: He has POA and iving will.Coppy requested.    Routine general medical examination at a health care facility  Preventive guidelines reviewed. Vaccination up to date.   Next CPE in 1 year.  HYPERCHOLESTEROLEMIA  No changes in current management, will follow labs done today and will give further recommendations accordingly. F/U in 6-12 months.  -     Comprehensive metabolic panel -     Lipid panel  Coronary artery disease involving native coronary artery of native heart without angina pectoris  Asymptomatic. Continue Aspirin 325 mg,Lipitor,and Metoprolol Succinate. EKG today: SR,LAFB, RBBB. Compare with EKG in 08/2014 LAFB now present,rest no significant changes. I do not have EKG for 2017.  He has not followed with cardiologists since 2012. I compared EKG with prior one after OV, he is asymptomatic but because now LAFB I will recommend following with cardiologists.   -     EKG 12-Lead -     Lipid panel  Hyperglycemia  Glucose 136 in 02/2016.  -     Hemoglobin A1c  Recurrent herpes labialis -     valACYclovir (VALTREX) 1000 MG tablet; Take 2 tabs upon acute onset and repeat dose in 12 hours.  Essential hypertension  Adequately controlled. No changes in current management. DASH diet recommended. Eye exam recommended annually. F/U in 6 months, before if needed.  -     metoprolol succinate (TOPROL-XL) 50 MG 24 hr tablet; Take 1 tablet  (50 mg total) by mouth daily. Take with or immediately following a meal. -     losartan (COZAAR) 50 MG tablet; TAKE 1 AND 1/2 TABLETS BY  MOUTH IN THE MORNING  Sensorineural hearing loss (SNHL) of both ears  He refused audiology referral, he doe snot think ne need hearing aids.  Return in 6 months (on 03/08/2017) for htn.    Aujanae Mccullum G. Martinique, MD  [facility]. [place] office.

## 2016-09-05 ENCOUNTER — Encounter: Payer: Self-pay | Admitting: Family Medicine

## 2016-09-05 ENCOUNTER — Ambulatory Visit (INDEPENDENT_AMBULATORY_CARE_PROVIDER_SITE_OTHER): Payer: Medicare Other | Admitting: Family Medicine

## 2016-09-05 VITALS — BP 136/70 | HR 74 | Resp 12 | Ht 70.0 in | Wt 189.0 lb

## 2016-09-05 DIAGNOSIS — I10 Essential (primary) hypertension: Secondary | ICD-10-CM | POA: Diagnosis not present

## 2016-09-05 DIAGNOSIS — B001 Herpesviral vesicular dermatitis: Secondary | ICD-10-CM

## 2016-09-05 DIAGNOSIS — I2510 Atherosclerotic heart disease of native coronary artery without angina pectoris: Secondary | ICD-10-CM

## 2016-09-05 DIAGNOSIS — H903 Sensorineural hearing loss, bilateral: Secondary | ICD-10-CM

## 2016-09-05 DIAGNOSIS — Z0000 Encounter for general adult medical examination without abnormal findings: Secondary | ICD-10-CM

## 2016-09-05 DIAGNOSIS — H9193 Unspecified hearing loss, bilateral: Secondary | ICD-10-CM | POA: Insufficient documentation

## 2016-09-05 DIAGNOSIS — E7800 Pure hypercholesterolemia, unspecified: Secondary | ICD-10-CM

## 2016-09-05 DIAGNOSIS — R739 Hyperglycemia, unspecified: Secondary | ICD-10-CM | POA: Diagnosis not present

## 2016-09-05 LAB — COMPREHENSIVE METABOLIC PANEL
ALT: 21 U/L (ref 0–53)
AST: 18 U/L (ref 0–37)
Albumin: 4.2 g/dL (ref 3.5–5.2)
Alkaline Phosphatase: 64 U/L (ref 39–117)
BUN: 16 mg/dL (ref 6–23)
CO2: 27 mEq/L (ref 19–32)
Calcium: 9.6 mg/dL (ref 8.4–10.5)
Chloride: 106 mEq/L (ref 96–112)
Creatinine, Ser: 0.78 mg/dL (ref 0.40–1.50)
GFR: 102.97 mL/min (ref >60.00)
Glucose, Bld: 99 mg/dL (ref 70–99)
Potassium: 4.4 mEq/L (ref 3.5–5.1)
Sodium: 141 mEq/L (ref 135–145)
Total Bilirubin: 1 mg/dL (ref 0.2–1.2)
Total Protein: 6.6 g/dL (ref 6.0–8.3)

## 2016-09-05 LAB — LIPID PANEL
Cholesterol: 142 mg/dL (ref 0–200)
HDL: 54.7 mg/dL (ref >39.00)
LDL Cholesterol: 58 mg/dL (ref 0–99)
NonHDL: 87.29
Total CHOL/HDL Ratio: 3
Triglycerides: 148 mg/dL (ref 0.0–149.0)
VLDL: 29.6 mg/dL (ref 0.0–40.0)

## 2016-09-05 LAB — HEMOGLOBIN A1C: Hgb A1c MFr Bld: 6.1 % (ref 4.6–6.5)

## 2016-09-05 MED ORDER — NITROGLYCERIN 0.4 MG SL SUBL: 0.4000 mg | SUBLINGUAL_TABLET | SUBLINGUAL | 3 refills | Status: DC | PRN

## 2016-09-05 MED ORDER — VALACYCLOVIR HCL 1 G PO TABS: ORAL_TABLET | ORAL | 3 refills | Status: DC

## 2016-09-05 MED ORDER — LOSARTAN POTASSIUM 50 MG PO TABS: ORAL_TABLET | ORAL | 2 refills | Status: DC

## 2016-09-05 MED ORDER — ATORVASTATIN CALCIUM 20 MG PO TABS: 20.0000 mg | ORAL_TABLET | Freq: Every day | ORAL | 3 refills | Status: DC

## 2016-09-05 MED ORDER — METOPROLOL SUCCINATE ER 50 MG PO TB24: 50.0000 mg | ORAL_TABLET | Freq: Every day | ORAL | 2 refills | Status: DC

## 2016-09-05 NOTE — Patient Instructions (Addendum)
A few things to remember from today's visit:   Medicare annual wellness visit, subsequent  HYPERCHOLESTEROLEMIA - Plan: Comprehensive metabolic panel, Lipid panel  Routine general medical examination at a health care facility  Coronary artery disease involving native coronary artery of native heart without angina pectoris - Plan: EKG 12-Lead, Lipid panel  Hyperglycemia - Plan: Hemoglobin A1c    At least 150 minutes of moderate exercise per week, daily brisk walking for 15-30 min is a good exercise option. Healthy diet low in saturated (animal) fats and sweets and consisting of fresh fruits and vegetables, lean meats such as fish and white chicken and whole grains.  - Vaccines:  Flu vaccine annually.   -Screening recommendations for low/normal risk males:  Screening for diabetes at age 65-45 and every 3 years. Cholesterol annually.   Colonoscopy for colon cancer screening at age 95 and until age 61.  Prostate cancer screening: continue with urology.  Fall prevention.     Please be sure medication list is accurate. If a new problem present, please set up appointment sooner than planned today.

## 2016-09-06 ENCOUNTER — Other Ambulatory Visit: Payer: Self-pay | Admitting: Family Medicine

## 2016-09-06 DIAGNOSIS — I2510 Atherosclerotic heart disease of native coronary artery without angina pectoris: Secondary | ICD-10-CM

## 2016-09-12 ENCOUNTER — Other Ambulatory Visit: Payer: Self-pay | Admitting: Family Medicine

## 2016-09-12 DIAGNOSIS — I2510 Atherosclerotic heart disease of native coronary artery without angina pectoris: Secondary | ICD-10-CM

## 2016-09-28 ENCOUNTER — Encounter: Payer: Self-pay | Admitting: Family Medicine

## 2016-10-16 ENCOUNTER — Encounter: Payer: Self-pay | Admitting: Interventional Cardiology

## 2016-10-25 NOTE — Progress Notes (Signed)
Cardiology Office Note    Date:  10/26/2016   ID:  IFEANYICHUKWU WICKHAM, DOB September 22, 1940, MRN [id number]  PCP:  Martinique, [name], MD  Cardiologist: Sinclair Grooms, MD   Chief Complaint  Patient presents with  . Coronary Artery Disease    History of Present Illness:  William York is a 76 y.o. male referred by Dr. Betty Martinique for CAD with bypass surgery in 1999  because of EKG abnormalities and to establish for longitudinal care.  He is asymptomatic. He exercises regularly. He had multivessel coronary bypass in 1999.Marland KitchenSecond was his cardiologist. He has no current complaints. Dr. Martinique referred for abnormal EKG. He exercises without limitations. He has not needed nitroglycerin. Dr. Cyndia Bent performed surgery in 1999.   Past Medical History:  Diagnosis Date  . Allergy   . CAD (coronary artery disease)   . ED (erectile dysfunction)   . Hyperlipidemia   . Hypertension   . Internal hemorrhoids   . OA (osteoarthritis)     Past Surgical History:  Procedure Laterality Date  . CORONARY ARTERY BYPASS GRAFT  June 1999  . HERNIA REPAIR  May 2006    Current Medications: Outpatient Medications Prior to Visit  Medication Sig Dispense Refill  . atorvastatin (LIPITOR) 20 MG tablet Take 1 tablet (20 mg total) by mouth daily. 90 tablet 3  . losartan (COZAAR) 50 MG tablet TAKE 1 AND 1/2 TABLETS BY  MOUTH IN THE MORNING 135 tablet 2  . metoprolol succinate (TOPROL-XL) 50 MG 24 hr tablet Take 1 tablet (50 mg total) by mouth daily. Take with or immediately following a meal. 90 tablet 2  . nitroGLYCERIN (NITROSTAT) 0.4 MG SL tablet Place 1 tablet (0.4 mg total) under the tongue every 5 (five) minutes as needed. 25 tablet 3  . valACYclovir (VALTREX) 1000 MG tablet Take 2 tabs upon acute onset and repeat dose in 12 hours. 30 tablet 3  . aspirin 325 MG tablet Take 325 mg by mouth daily.      . minocycline (MINOCIN,DYNACIN) 50 MG capsule     . nitroGLYCERIN (NITROSTAT) 0.4 MG SL tablet PLACE 1  TABLET (0.4 MG TOTAL) UNDER THE TONGUE EVERY 5 (FIVE) MINUTES AS NEEDED. (Patient not taking: Reported on 10/26/2016) 25 tablet 2   No facility-administered medications prior to visit.      Allergies:   Patient has no known allergies.   Social History   Social History  . Marital status: Married    Spouse name: N/A  . Number of children: N/A  . Years of education: N/A   Social History Main Topics  . Smoking status: Never Smoker  . Smokeless tobacco: Never Used  . Alcohol use Yes     Comment: one daily   . Drug use: No  . Sexual activity: Not Asked   Other Topics Concern  . None   Social History Narrative   Daily caffeine      Family History:  The patient's family history includes Colon cancer in his cousin; Stroke in his father.   ROS:   Please see the history of present illness.    Otherwise unremarkable.aring loss, snoring,  All other systems reviewed and are negative.   PHYSICAL EXAM:   VS:  BP 134/70 (BP Location: Left Arm)   Pulse 77   Ht 5\' 10"  (1.778 m)   Wt 189 lb 9.6 oz (86 kg)   BMI 27.20 kg/m    GEN: Well nourished, well developed, in no acute  distress  HEENT: normal  Neck: no JVD, carotid bruits, or masses Cardiac: RRR; no murmurs, rubs, or gallops,no edema  Respiratory:  clear to auscultation bilaterally, normal work of breathing GI: soft, nontender, nondistended, + BS MS: no deformity or atrophy  Skin: warm and dry, no rash Neuro:  Alert and Oriented x 3, Strength and sensation are intact Psych: euthymic mood, full affect  Wt Readings from Last 3 Encounters:  10/26/16 189 lb 9.6 oz (86 kg)  09/05/16 189 lb (85.7 kg)  02/23/16 190 lb 2 oz (86.2 kg)      Studies/Labs Reviewed:   EKG:  EKG Right bundle branch block, left anterior hemiblock, left abnormality, otherwise unremarkable. No change when compared to 2016 tracing.   Recent Labs: 09/05/2016: ALT 21; BUN 16; Creatinine, Ser 0.78; Potassium 4.4; Sodium 141   Lipid Panel      Component Value Date/Time   CHOL 142 09/05/2016 0826   TRIG 148.0 09/05/2016 0826   HDL 54.70 09/05/2016 0826   CHOLHDL 3 09/05/2016 0826   VLDL 29.6 09/05/2016 0826   LDLCALC 58 09/05/2016 0826    Additional studies/ records that were reviewed today include:  No recent functional testing.     ASSESSMENT:    1. Coronary artery disease involving native coronary artery of native heart without angina pectoris   2. Essential hypertension   3. HYPERCHOLESTEROLEMIA      PLAN:  In order of problems listed above:  1. Stable without angina pectoris. He is now nearly 20 years out from bypass grafting. We will do a stress myocardial perfusion study to exclude silent graft occlusion. 2. Blood pressure target 130/85 mmHg or less. Low salt diet. 3. LDL target less than 70. Most recent 50 08/16/2016.  Plan is risk factor modification. Stress nuclear study to rule out silent occlusion of greater than 7 year old bypass grafts. Encouraged aerobic activity. Clinical follow-up in one year.    dication Adjustments/Labs and Tests Ordered: Current medicines are reviewed at length with the patient today.  Concerns regarding medicines are outlined above.  Medication changes, Labs and Tests ordered today are listed in the Patient Instructions below. Patient Instructions  Medication Instructions:  1) DECREASE Aspirin to 81mg  once daily  Labwork: None  Testing/Procedures: Your physician has requested that you have a lexiscan myoview. For further information please visit [email or web address]. Please follow instruction sheet, as given.   Follow-Up: Your physician wants you to follow-up in: 1 year with Dr. Tamala Julian.  You will receive a reminder letter in the mail two months in advance. If you don't receive a letter, please call our office to schedule the follow-up appointment.   Any Other Special Instructions Will Be Listed Below (If Applicable).     If you need a refill on your cardiac  medications before your next appointment, please call your pharmacy.      Signed, Sinclair Grooms, MD  10/26/2016 1:18 PM    [facility] [place], [place], [place]  [address] Phone: [phone]; Fax: [phone]

## 2016-10-26 ENCOUNTER — Encounter: Payer: Self-pay | Admitting: Interventional Cardiology

## 2016-10-26 ENCOUNTER — Ambulatory Visit (INDEPENDENT_AMBULATORY_CARE_PROVIDER_SITE_OTHER): Payer: Medicare Other | Admitting: Interventional Cardiology

## 2016-10-26 VITALS — BP 134/70 | HR 77 | Ht 70.0 in | Wt 189.6 lb

## 2016-10-26 DIAGNOSIS — I2510 Atherosclerotic heart disease of native coronary artery without angina pectoris: Secondary | ICD-10-CM

## 2016-10-26 DIAGNOSIS — I10 Essential (primary) hypertension: Secondary | ICD-10-CM

## 2016-10-26 DIAGNOSIS — E7800 Pure hypercholesterolemia, unspecified: Secondary | ICD-10-CM

## 2016-10-26 MED ORDER — ASPIRIN EC 81 MG PO TBEC: 81.0000 mg | DELAYED_RELEASE_TABLET | Freq: Every day | ORAL | 3 refills | Status: AC

## 2016-10-26 NOTE — Patient Instructions (Signed)
Medication Instructions:  1) DECREASE Aspirin to 81mg  once daily  Labwork: None  Testing/Procedures: Your physician has requested that you have a lexiscan myoview. For further information please visit [email or web address]. Please follow instruction sheet, as given.   Follow-Up: Your physician wants you to follow-up in: 1 year with Dr. Tamala Julian.  You will receive a reminder letter in the mail two months in advance. If you don't receive a letter, please call our office to schedule the follow-up appointment.   Any Other Special Instructions Will Be Listed Below (If Applicable).     If you need a refill on your cardiac medications before your next appointment, please call your pharmacy.

## 2016-11-13 ENCOUNTER — Telehealth (HOSPITAL_COMMUNITY): Payer: Self-pay | Admitting: *Deleted

## 2016-11-13 NOTE — Telephone Encounter (Signed)
Left message on voicemail per DPR in reference to upcoming appointment scheduled on 11/16/16 at 0715 with detailed instructions given per Myocardial Perfusion Study Information Sheet for the test. LM to arrive 15 minutes early, and that it is imperative to arrive on time for appointment to keep from having the test rescheduled. If you need to cancel or reschedule your appointment, please call the office within 24 hours of your appointment. Failure to do so may result in a cancellation of your appointment, and a $50 no show fee. Phone number given for call back for any questions.

## 2016-11-16 ENCOUNTER — Ambulatory Visit (HOSPITAL_COMMUNITY): Payer: Medicare Other | Attending: Cardiology

## 2016-11-16 DIAGNOSIS — I4510 Unspecified right bundle-branch block: Secondary | ICD-10-CM | POA: Diagnosis not present

## 2016-11-16 DIAGNOSIS — I10 Essential (primary) hypertension: Secondary | ICD-10-CM | POA: Insufficient documentation

## 2016-11-16 DIAGNOSIS — I2510 Atherosclerotic heart disease of native coronary artery without angina pectoris: Secondary | ICD-10-CM | POA: Diagnosis not present

## 2016-11-16 LAB — MYOCARDIAL PERFUSION IMAGING
Estimated workload: 12.5 METS
Exercise duration (min): 10 min
Exercise duration (sec): 30 s
LV dias vol: 122 mL (ref 62–150)
LV sys vol: 57 mL
MPHR: 145 {beats}/min
Peak HR: 126 {beats}/min
Percent HR: 86 %
RATE: 0.35
RPE: 18
Rest HR: 61 {beats}/min
SDS: 3
SRS: 3
SSS: 6
TID: 0.83

## 2016-11-16 MED ORDER — TECHNETIUM TC 99M TETROFOSMIN IV KIT
9.2000 | PACK | Freq: Once | INTRAVENOUS | Status: AC | PRN
  Administered 2016-11-16: 9.2 via INTRAVENOUS
  Filled 2016-11-16: qty 10

## 2016-11-16 MED ORDER — TECHNETIUM TC 99M TETROFOSMIN IV KIT
30.5000 | PACK | Freq: Once | INTRAVENOUS | Status: AC | PRN
Start: 2016-11-16 — End: 2016-11-16
  Administered 2016-11-16: 30.5 via INTRAVENOUS
  Filled 2016-11-16: qty 31

## 2016-12-26 DIAGNOSIS — H2513 Age-related nuclear cataract, bilateral: Secondary | ICD-10-CM | POA: Diagnosis not present

## 2017-05-22 DIAGNOSIS — L812 Freckles: Secondary | ICD-10-CM | POA: Diagnosis not present

## 2017-05-22 DIAGNOSIS — D1801 Hemangioma of skin and subcutaneous tissue: Secondary | ICD-10-CM | POA: Diagnosis not present

## 2017-05-22 DIAGNOSIS — L821 Other seborrheic keratosis: Secondary | ICD-10-CM | POA: Diagnosis not present

## 2017-05-22 DIAGNOSIS — Z85828 Personal history of other malignant neoplasm of skin: Secondary | ICD-10-CM | POA: Diagnosis not present

## 2017-05-22 DIAGNOSIS — L570 Actinic keratosis: Secondary | ICD-10-CM | POA: Diagnosis not present

## 2017-06-06 DIAGNOSIS — H00011 Hordeolum externum right upper eyelid: Secondary | ICD-10-CM | POA: Diagnosis not present

## 2017-06-27 ENCOUNTER — Other Ambulatory Visit: Payer: Self-pay | Admitting: Family Medicine

## 2017-06-27 DIAGNOSIS — H00011 Hordeolum externum right upper eyelid: Secondary | ICD-10-CM | POA: Diagnosis not present

## 2017-06-27 DIAGNOSIS — I10 Essential (primary) hypertension: Secondary | ICD-10-CM

## 2017-07-24 DIAGNOSIS — R972 Elevated prostate specific antigen [PSA]: Secondary | ICD-10-CM | POA: Diagnosis not present

## 2017-08-06 ENCOUNTER — Telehealth: Payer: Self-pay | Admitting: Family Medicine

## 2017-08-06 NOTE — Telephone Encounter (Signed)
Copied from [place] [phone]. Topic: Quick Communication - Rx Refill/Question >> Aug 06, 2017  3:50 PM Synthia Innocent wrote: Medication: losartan (COZAAR) 50 MG tablet, needs 90 day supply  Has the patient contacted their pharmacy? Yes.   (Agent: If no, request that the patient contact the pharmacy for the refill.) (Agent: If yes, when and what did the pharmacy advise?)  Preferred Pharmacy (with phone number or street name): [facility]  Agent: Please be advised that RX refills may take up to 3 business days. We ask that you follow-up with your pharmacy.

## 2017-08-07 ENCOUNTER — Other Ambulatory Visit: Payer: Self-pay | Admitting: *Deleted

## 2017-08-07 DIAGNOSIS — I10 Essential (primary) hypertension: Secondary | ICD-10-CM

## 2017-08-07 MED ORDER — LOSARTAN POTASSIUM 50 MG PO TABS: ORAL_TABLET | ORAL | 0 refills | Status: DC

## 2017-08-07 NOTE — Telephone Encounter (Signed)
Cozar refill Last Refill:06/27/17 # 45 Last OV: 09/05/16 PCP: Betty Martinique MD Pharmacy: [facility]

## 2017-08-07 NOTE — Telephone Encounter (Signed)
Rx sent to pharmacy   

## 2017-09-04 ENCOUNTER — Other Ambulatory Visit: Payer: Self-pay | Admitting: Family Medicine

## 2017-09-04 DIAGNOSIS — I10 Essential (primary) hypertension: Secondary | ICD-10-CM

## 2017-09-10 NOTE — Progress Notes (Signed)
Subjective:   DIALLO PONDER is a 77 y.o. male who presents for Medicare Annual/Subsequent preventive examination.  Reports health as good  OV with Dr. Martinique at 7:30  Dtr in [place] with 2 dtr; 41 and 15 grands Son in [place] has one younger grandson  Diet BMI 27.2  Chol/hdl 3; HDL 54 A1c 08/2016 was 6.1  Don't add sugar, ice cream   Exercise Sport time x 2 per week ( club fitness) 45 minutes on the treadmill Walk outside   Stress; normal   Health Maintenance Due  Topic Date Due  . INFLUENZA VACCINE  08/09/2017   Colonoscopy 07/2014 - done  Takes flu vaccine in Sept at [facility]         Objective:    Vitals: There were no vitals taken for this visit.  There is no height or weight on file to calculate BMI.  Advanced Directives 09/05/2016 07/07/2014  Does Patient Have a Medical Advance Directive? Yes No  Would patient like information on creating a medical advance directive? - No - patient declined information    Tobacco Social History   Tobacco Use  Smoking Status Never Smoker  Smokeless Tobacco Never Used     Counseling given: Not Answered   Clinical Intake:     Past Medical History:  Diagnosis Date  . Allergy   . CAD (coronary artery disease)   . ED (erectile dysfunction)   . Hyperlipidemia   . Hypertension   . Internal hemorrhoids   . OA (osteoarthritis)    Past Surgical History:  Procedure Laterality Date  . CORONARY ARTERY BYPASS GRAFT  June 1999  . HERNIA REPAIR  May 2006   Family History  Problem Relation Age of Onset  . Colon cancer Cousin        Mother side-First cousin  . Stroke Father    Social History   Socioeconomic History  . Marital status: Married    Spouse name: Not on file  . Number of children: Not on file  . Years of education: Not on file  . Highest education level: Not on file  Occupational History  . Not on file  Social Needs  . Financial resource strain: Not on file  . Food insecurity:    Worry: Not on file   Inability: Not on file  . Transportation needs:    Medical: Not on file    Non-medical: Not on file  Tobacco Use  . Smoking status: Never Smoker  . Smokeless tobacco: Never Used  Substance and Sexual Activity  . Alcohol use: Yes    Comment: one daily   . Drug use: No  . Sexual activity: Not on file  Lifestyle  . Physical activity:    Days per week: Not on file    Minutes per session: Not on file  . Stress: Not on file  Relationships  . Social connections:    Talks on phone: Not on file    Gets together: Not on file    Attends religious service: Not on file    Active member of club or organization: Not on file    Attends meetings of clubs or organizations: Not on file    Relationship status: Not on file  Other Topics Concern  . Not on file  Social History Narrative   Daily caffeine     Outpatient Encounter Medications as of 09/11/2017  Medication Sig  . aspirin EC 81 MG tablet Take 1 tablet (81 mg total) by mouth daily.  Marland Kitchen  atorvastatin (LIPITOR) 20 MG tablet Take 1 tablet (20 mg total) by mouth daily.  Marland Kitchen losartan (COZAAR) 50 MG tablet TAKE 1 AND 1/2 TABLETS BY  MOUTH IN THE MORNING  . metoprolol succinate (TOPROL-XL) 50 MG 24 hr tablet TAKE 1 TABLET BY MOUTH  DAILY WITH OR IMMEDIATLEY  FOLLOWING A MEAL  . nitroGLYCERIN (NITROSTAT) 0.4 MG SL tablet Place 1 tablet (0.4 mg total) under the tongue every 5 (five) minutes as needed.  . valACYclovir (VALTREX) 1000 MG tablet Take 2 tabs upon acute onset and repeat dose in 12 hours.   No facility-administered encounter medications on file as of 09/11/2017.     Activities of Daily Living No flowsheet data found.  Patient Care Team: Martinique, [name], MD as PCP - General (Family Medicine)   Patient Care Team: Martinique, [name], MD as PCP - General (Family Medicine) Belva Crome, MD as Consulting Physician (Cardiology) Kathie Rhodes, MD as Consulting Physician (Urology) Melissa Noon, [place] as Referring Physician (Optometry)  Dr.  Jarome Matin in dermatology  Assessment:   This is a routine wellness examination for Fadil.  Exercise Activities and Dietary recommendations    Goals   None     Fall Risk Fall Risk  09/05/2016 08/23/2015 08/20/2014 07/24/2013  Falls in the past year? No No No No     Depression Screen PHQ 2/9 Scores 09/05/2016 08/23/2015 08/20/2014 07/24/2013  PHQ - 2 Score 0 0 0 0    Cognitive Function     Ad8 score reviewed for issues:  Issues making decisions:  Less interest in hobbies / activities:  Repeats questions, stories (family complaining):  Trouble using ordinary gadgets (microwave, computer, phone):  Forgets the month or year:   Mismanaging finances:   Remembering appts:  Daily problems with thinking and/or memory: Ad8 score is=0 Wife may be starting to have some issues         Immunization History  Administered Date(s) Administered  . Influenza Split 09/18/2012  . Influenza Whole 11/01/2006, 10/09/2008, 09/23/2011  . Influenza, High Dose Seasonal PF 10/07/2013  . Influenza-Unspecified 10/04/2014, 10/05/2015, 09/20/2016  . Pneumococcal Conjugate-13 10/07/2013  . Pneumococcal Polysaccharide-23 01/09/1997, 07/01/2004, 08/15/2012  . Td 06/26/2006  . Tdap 07/21/2010  . Zoster 09/23/2012      Screening Tests Health Maintenance  Topic Date Due  . INFLUENZA VACCINE  08/09/2017  . TETANUS/TDAP  07/20/2020  . PNA vac Low Risk Adult  Completed      Plan:      PCP Notes   Health Maintenance  Colonoscopy 07/2014 - done  Takes flu vaccine [facility] Sept [facility]   Will take the shingrix; education provided  Eye checks every year with Dr. Delman Cheadle in Dec   A1c was 6.1 last year and was educated today  Eats ice cream but sugar or sweets in moderation.  BMI wnl for age     Abnormal Screens  States he already has a hearing exam scheduled    Referrals  none  Patient concerns; As noted   Nurse Concerns; As noted   Next PCP apt Was seen today and had lab  drawn     I have personally reviewed and noted the following in the patient's chart:   . Medical and social history . Use of alcohol, tobacco or illicit drugs  . Current medications and supplements . Functional ability and status . Nutritional status . Physical activity . Advanced directives . List of other physicians . Hospitalizations, surgeries, and ER visits in previous  12 months . Vitals . Screenings to include cognitive, depression, and falls . Referrals and appointments  In addition, I have reviewed and discussed with patient certain preventive protocols, quality metrics, and best practice recommendations. A written personalized care plan for preventive services as well as general preventive health recommendations were provided to patient.     Wynetta Fines, RN  09/10/2017

## 2017-09-10 NOTE — Progress Notes (Signed)
HPI:  [name] is a 77 y.o.male here today for his routine physical examination.  Last CPE: 09/05/16 He lives with his wife.  Regular exercise 3 or more times per week: Yes,he is doing treadmill and walking about 3 times per week. Following a healthy diet: Yes.   Chronic medical problems:HLD, HTN,CAD, follows with Dr Tamala Julian. Last appt in 07/2017 and folows annually.  Also history of seasonal allergies, rhinorrhea usually in the mornings. Mild hearing loss, he has an appointment with ENT next week.   Immunization History  Administered Date(s) Administered  . Influenza Split 09/18/2012  . Influenza Whole 11/01/2006, 10/09/2008, 09/23/2011  . Influenza, High Dose Seasonal PF 10/07/2013  . Influenza-Unspecified 10/04/2014, 10/05/2015, 09/20/2016  . Pneumococcal Conjugate-13 10/07/2013  . Pneumococcal Polysaccharide-23 01/09/1997, 07/01/2004, 08/15/2012  . Td 06/26/2006  . Tdap 07/21/2010  . Zoster 09/23/2012   Last colonoscopy in 07/2014 PSA in 07/2017 was 0.7. He follows with urologist. Nocturia x 1. Denies dysuria,increased urinary frequency, gross hematuria,or decreased urine output.  -Denies high alcohol intake, tobacco use, or Hx of illicit drug use.  -Concerns and/or follow up today: Right groin pain  Pain is dull, 2/10, intermittent right inguinal pain for the past 2 to 3 weeks. Problem has been stable. It is usually aggravated by prolonged sitting. No alleviating factors identified. No associated fever, chills, abdominal pain, nausea, vomiting, or changes in bowel habits. He has not taking OTC medications. History of hernia repair in 2006.  Hyperlipidemia:  Currently on Lipitor 20 mg daily. Following a low fat diet: Yes.  He has not noted side effects with medication.  Lab Results  Component Value Date   CHOL 142 09/05/2016   HDL 54.70 09/05/2016   LDLCALC 58 09/05/2016   TRIG 148.0 09/05/2016   CHOLHDL 3 09/05/2016    Hypertension:    Currently on metoprolol succinate 50 mg daily and Cozaar 75 mg daily. He is taking medications as instructed, no side effects reported.  He has not noted unusual headache, visual changes, exertional chest pain, dyspnea,  focal weakness, or edema.   Lab Results  Component Value Date   CREATININE 0.78 09/05/2016   BUN 16 09/05/2016   NA 141 09/05/2016   K 4.4 09/05/2016   CL 106 09/05/2016   CO2 27 09/05/2016    Recurrent labial herpes:  He is on Valtrex 1000 mg twice daily as needed. 2 episodes last year,still has a refill left from last prescription. Tolerating medication well, denies side effects.  Review of Systems  Constitutional: Negative for activity change, appetite change, fatigue and fever.  HENT: Positive for postnasal drip and rhinorrhea. Negative for congestion, dental problem, nosebleeds, sore throat, trouble swallowing and voice change.   Eyes: Negative for redness and visual disturbance.  Respiratory: Positive for cough. Negative for shortness of breath and wheezing.   Cardiovascular: Negative for chest pain, palpitations and leg swelling.  Gastrointestinal: Negative for abdominal pain, blood in stool, nausea and vomiting.  Endocrine: Negative for cold intolerance, heat intolerance, polydipsia, polyphagia and polyuria.  Genitourinary: Negative for decreased urine volume, dysuria, genital sores, hematuria and testicular pain.  Musculoskeletal: Negative for gait problem and myalgias.  Skin: Negative for color change and rash.  Allergic/Immunologic: Positive for environmental allergies.  Neurological: Negative for syncope, weakness and headaches.  Psychiatric/Behavioral: Negative for confusion and sleep disturbance. The patient is not nervous/anxious.   All other systems reviewed and are negative.    Current Outpatient Medications on File Prior to Visit  Medication Sig Dispense Refill  . aspirin EC 81 MG tablet Take 1 tablet (81 mg total) by mouth daily. 90  tablet 3   No current facility-administered medications on file prior to visit.      Past Medical History:  Diagnosis Date  . Allergy   . CAD (coronary artery disease)   . ED (erectile dysfunction)   . Hyperlipidemia   . Hypertension   . Internal hemorrhoids   . OA (osteoarthritis)     Past Surgical History:  Procedure Laterality Date  . CORONARY ARTERY BYPASS GRAFT  June 1999  . HERNIA REPAIR  May 2006    No Known Allergies  Family History  Problem Relation Age of Onset  . Colon cancer Cousin        Mother side-First cousin  . Stroke Father     Social History   Socioeconomic History  . Marital status: Married    Spouse name: Not on file  . Number of children: Not on file  . Years of education: Not on file  . Highest education level: Not on file  Occupational History  . Not on file  Social Needs  . Financial resource strain: Not on file  . Food insecurity:    Worry: Not on file    Inability: Not on file  . Transportation needs:    Medical: Not on file    Non-medical: Not on file  Tobacco Use  . Smoking status: Never Smoker  . Smokeless tobacco: Never Used  Substance and Sexual Activity  . Alcohol use: Yes    Comment: one daily   . Drug use: No  . Sexual activity: Not on file  Lifestyle  . Physical activity:    Days per week: Not on file    Minutes per session: Not on file  . Stress: Not on file  Relationships  . Social connections:    Talks on phone: Not on file    Gets together: Not on file    Attends religious service: Not on file    Active member of club or organization: Not on file    Attends meetings of clubs or organizations: Not on file    Relationship status: Not on file  Other Topics Concern  . Not on file  Social History Narrative   Daily caffeine      Vitals:   09/11/17 0717  BP: 130/82  Pulse: 69  Resp: 12  Temp: 98.3 F (36.8 C)  SpO2: 95%   Body mass index is 27.48 kg/m.   Wt Readings from Last 3 Encounters:    09/11/17 191 lb 8 oz (86.9 kg)  11/16/16 189 lb (85.7 kg)  10/26/16 189 lb 9.6 oz (86 kg)    Physical Exam  Nursing note and vitals reviewed. Constitutional: He is oriented to person, place, and time. He appears well-developed and well-nourished. No distress.  HENT:  Head: Normocephalic and atraumatic.  Right Ear: Tympanic membrane, external ear and ear canal normal.  Left Ear: Tympanic membrane, external ear and ear canal normal. Decreased hearing is noted.  Mouth/Throat: Oropharynx is clear and moist and mucous membranes are normal.  Eyes: Pupils are equal, round, and reactive to light. Conjunctivae and EOM are normal.  Neck: Normal range of motion. No tracheal deviation present. No thyromegaly present.  Cardiovascular: Normal rate and regular rhythm.  No murmur heard. Pulses:      Dorsalis pedis pulses are 2+ on the right side, and 2+ on the left side.  Respiratory:  Effort normal and breath sounds normal. No respiratory distress.  GI: Soft. He exhibits no mass. There is no hepatomegaly. There is no tenderness. Hernia confirmed negative in the right inguinal area and confirmed negative in the left inguinal area.  Genitourinary: Right testis shows no mass, no swelling and no tenderness. Left testis shows no mass, no swelling and no tenderness.  Musculoskeletal: He exhibits no edema or tenderness.  No major deformities appreciated and no signs of synovitis.  Lymphadenopathy:    He has no cervical adenopathy.       Right: No inguinal and no supraclavicular adenopathy present.       Left: No inguinal and no supraclavicular adenopathy present.  Neurological: He is alert and oriented to person, place, and time. He has normal strength. No cranial nerve deficit or sensory deficit. Coordination and gait normal.  Reflex Scores:      Bicep reflexes are 2+ on the right side and 2+ on the left side.      Patellar reflexes are 2+ on the right side and 2+ on the left side. Skin: Skin is warm. No  erythema.  Psychiatric: He has a normal mood and affect. Cognition and memory are normal.     ASSESSMENT AND PLAN:  William York was seen today for annual exam.  Orders Placed This Encounter  Procedures  . Comprehensive metabolic panel  . Lipid panel    Lab Results  Component Value Date   ALT 21 09/11/2017   AST 16 09/11/2017   ALKPHOS 64 09/11/2017   BILITOT 0.7 09/11/2017   Lab Results  Component Value Date   CREATININE 0.88 09/11/2017   BUN 22 09/11/2017   NA 143 09/11/2017   K 4.5 09/11/2017   CL 107 09/11/2017   CO2 25 09/11/2017   Lab Results  Component Value Date   CHOL 138 09/11/2017   HDL 56.90 09/11/2017   LDLCALC 60 09/11/2017   TRIG 106.0 09/11/2017   CHOLHDL 2 09/11/2017     Routine general medical examination at a health care facility We discussed the importance of regular physical activity and healthy diet for prevention of chronic illness and/or complications. Preventive guidelines reviewed. Vaccination up to date. Aspirin for secondary prevention to continue. Next CPE in a year.   Right groin pain We discussed possible etiologies. Today's examination is negative, pain is not reproducible. For now we will continue monitoring, instructed about warning signs. Follow-up in 6 months , before if needed  Other orders -     Zoster Vaccine Adjuvanted [facility]) injection; 0.5 ml in muscle and repeat in 8 weeks   Recurrent herpes labialis Stable overall. No changes recorded management. Follow-up in a year.  HYPERCHOLESTEROLEMIA No changes in current management, will follow labs done today and will give further recommendations accordingly. Follow-up in 12 months.  Essential hypertension Adequately controlled. Continue metoprolol succinate 50 mg daily and Cozaar 75 mg daily. Continue low-salt diet. Eye exam is current, next appointment in 12/2017. Follow-up in 6 months.  CAD (coronary artery disease) Asymptomatic. Instructed about  warning signs. Continue following with Dr. Tamala Julian, next appointment 07/2018.    Return in 6 months (on 03/12/2018) for HTN,groin pain.     Clella Mckeel G. Martinique, MD  [facility]. [place] office.

## 2017-09-11 ENCOUNTER — Encounter: Payer: Self-pay | Admitting: Family Medicine

## 2017-09-11 ENCOUNTER — Ambulatory Visit (INDEPENDENT_AMBULATORY_CARE_PROVIDER_SITE_OTHER): Payer: Medicare Other | Admitting: Family Medicine

## 2017-09-11 ENCOUNTER — Ambulatory Visit (INDEPENDENT_AMBULATORY_CARE_PROVIDER_SITE_OTHER): Payer: Medicare Other

## 2017-09-11 VITALS — BP 130/82 | HR 69 | Ht 70.0 in | Wt 191.0 lb

## 2017-09-11 VITALS — BP 130/82 | HR 69 | Temp 98.3°F | Resp 12 | Ht 70.0 in | Wt 191.5 lb

## 2017-09-11 DIAGNOSIS — E7800 Pure hypercholesterolemia, unspecified: Secondary | ICD-10-CM | POA: Diagnosis not present

## 2017-09-11 DIAGNOSIS — I10 Essential (primary) hypertension: Secondary | ICD-10-CM | POA: Diagnosis not present

## 2017-09-11 DIAGNOSIS — B001 Herpesviral vesicular dermatitis: Secondary | ICD-10-CM | POA: Diagnosis not present

## 2017-09-11 DIAGNOSIS — Z0000 Encounter for general adult medical examination without abnormal findings: Secondary | ICD-10-CM | POA: Diagnosis not present

## 2017-09-11 DIAGNOSIS — I2510 Atherosclerotic heart disease of native coronary artery without angina pectoris: Secondary | ICD-10-CM | POA: Diagnosis not present

## 2017-09-11 DIAGNOSIS — R1031 Right lower quadrant pain: Secondary | ICD-10-CM

## 2017-09-11 LAB — COMPREHENSIVE METABOLIC PANEL
ALT: 21 U/L (ref 0–53)
AST: 16 U/L (ref 0–37)
Albumin: 4.4 g/dL (ref 3.5–5.2)
Alkaline Phosphatase: 64 U/L (ref 39–117)
BUN: 22 mg/dL (ref 6–23)
CO2: 25 mEq/L (ref 19–32)
Calcium: 9.4 mg/dL (ref 8.4–10.5)
Chloride: 107 mEq/L (ref 96–112)
Creatinine, Ser: 0.88 mg/dL (ref 0.40–1.50)
GFR: 89.35 mL/min (ref >60.00)
Glucose, Bld: 104 mg/dL — ABNORMAL HIGH (ref 70–99)
Potassium: 4.5 mEq/L (ref 3.5–5.1)
Sodium: 143 mEq/L (ref 135–145)
Total Bilirubin: 0.7 mg/dL (ref 0.2–1.2)
Total Protein: 6.7 g/dL (ref 6.0–8.3)

## 2017-09-11 MED ORDER — ZOSTER VAC RECOMB ADJUVANTED 50 MCG/0.5ML IM SUSR: INTRAMUSCULAR | 1 refills | Status: DC

## 2017-09-11 MED ORDER — LOSARTAN POTASSIUM 50 MG PO TABS: 75.0000 mg | ORAL_TABLET | Freq: Every day | ORAL | 2 refills | Status: DC

## 2017-09-11 MED ORDER — METOPROLOL SUCCINATE ER 50 MG PO TB24: ORAL_TABLET | ORAL | 2 refills | Status: DC

## 2017-09-11 MED ORDER — VALACYCLOVIR HCL 1 G PO TABS: ORAL_TABLET | ORAL | 1 refills | Status: DC

## 2017-09-11 MED ORDER — NITROGLYCERIN 0.4 MG SL SUBL: 0.4000 mg | SUBLINGUAL_TABLET | SUBLINGUAL | 0 refills | Status: DC | PRN

## 2017-09-11 MED ORDER — ATORVASTATIN CALCIUM 20 MG PO TABS: 20.0000 mg | ORAL_TABLET | Freq: Every day | ORAL | 3 refills | Status: DC

## 2017-09-11 NOTE — Patient Instructions (Addendum)
William York , Thank you for taking time to come for your Medicare Wellness Visit. I appreciate your ongoing commitment to your health goals. Please review the following plan we discussed and let me know if I can assist you in the future.   Please send a Mychart note when your flu vaccine has been completed  Shingrix is a vaccine for the prevention of Shingles in Adults 50 and older.  If you are on Medicare, the shingrix is covered under your Part D plan, so you will take both of the vaccines in the series at your pharmacy. Please check with your benefits regarding applicable copays or out of pocket expenses.  The Shingrix is given in 2 vaccines approx 8 weeks apart. You must receive the 2nd dose prior to 6 months from receipt of the first. Please have the pharmacist print out you Immunization  dates for our office records   Deaf & Hard of Hearing Division Services - can assist with hearing aid x 1  No reviews  [facility]  [id number]  [phone] - [place]   [email or web address]    These are the goals we discussed: Goals    . Patient Stated     Maintain your current health        This is a list of the screening recommended for you and due dates:  Health Maintenance  Topic Date Due  . Flu Shot  08/09/2017  . Tetanus Vaccine  07/20/2020  . Pneumonia vaccines  Completed      Fall Prevention in the Home Falls can cause injuries. They can happen to people of all ages. There are many things you can do to make your home safe and to help prevent falls. What can I do on the outside of my home?  Regularly fix the edges of walkways and driveways and fix any cracks.  Remove anything that might make you trip as you walk through a door, such as a raised step or threshold.  Trim any bushes or trees on the path to your home.  Use bright outdoor lighting.  Clear any  walking paths of anything that might make someone trip, such as rocks or tools.  Regularly check to see if handrails are loose or broken. Make sure that both sides of any steps have handrails.  Any raised decks and porches should have guardrails on the edges.  Have any leaves, snow, or ice cleared regularly.  Use sand or salt on walking paths during winter.  Clean up any spills in your garage right away. This includes oil or grease spills. What can I do in the bathroom?  Use night lights.  Install grab bars by the toilet and in the tub and shower. Do not use towel bars as grab bars.  Use non-skid mats or decals in the tub or shower.  If you need to sit down in the shower, use a plastic, non-slip stool.  Keep the floor dry. Clean up any water that spills on the floor as soon as it happens.  Remove soap buildup in the tub or shower regularly.  Attach bath mats securely with double-sided non-slip rug tape.  Do not have throw rugs and other things on the floor that can make you trip. What can I do in the bedroom?  Use night lights.  Make sure that you have a light by your bed that is easy to reach.  Do not use any sheets or blankets that are  too big for your bed. They should not hang down onto the floor.  Have a firm chair that has side arms. You can use this for support while you get dressed.  Do not have throw rugs and other things on the floor that can make you trip. What can I do in the kitchen?  Clean up any spills right away.  Avoid walking on wet floors.  Keep items that you use a lot in easy-to-reach places.  If you need to reach something above you, use a strong step stool that has a grab bar.  Keep electrical cords out of the way.  Do not use floor polish or wax that makes floors slippery. If you must use wax, use non-skid floor wax.  Do not have throw rugs and other things on the floor that can make you trip. What can I do with my stairs?  Do not leave  any items on the stairs.  Make sure that there are handrails on both sides of the stairs and use them. Fix handrails that are broken or loose. Make sure that handrails are as long as the stairways.  Check any carpeting to make sure that it is firmly attached to the stairs. Fix any carpet that is loose or worn.  Avoid having throw rugs at the top or bottom of the stairs. If you do have throw rugs, attach them to the floor with carpet tape.  Make sure that you have a light switch at the top of the stairs and the bottom of the stairs. If you do not have them, ask someone to add them for you. What else can I do to help prevent falls?  Wear shoes that: ? Do not have high heels. ? Have rubber bottoms. ? Are comfortable and fit you well. ? Are closed at the toe. Do not wear sandals.  If you use a stepladder: ? Make sure that it is fully opened. Do not climb a closed stepladder. ? Make sure that both sides of the stepladder are locked into place. ? Ask someone to hold it for you, if possible.  Clearly mark and make sure that you can see: ? Any grab bars or handrails. ? First and last steps. ? Where the edge of each step is.  Use tools that help you move around (mobility aids) if they are needed. These include: ? Canes. ? Walkers. ? Scooters. ? Crutches.  Turn on the lights when you go into a dark area. Replace any light bulbs as soon as they burn out.  Set up your furniture so you have a clear path. Avoid moving your furniture around.  If any of your floors are uneven, fix them.  If there are any pets around you, be aware of where they are.  Review your medicines with your doctor. Some medicines can make you feel dizzy. This can increase your chance of falling. Ask your doctor what other things that you can do to help prevent falls. This information is not intended to replace advice given to you by your health care provider. Make sure you discuss any questions you have with your  health care provider. Document Released: 10/22/2008 Document Revised: 06/03/2015 Document Reviewed: 01/30/2014 Elsevier Interactive Patient Education  2018 [place] Maintenance, Male A healthy lifestyle and preventive care is important for your health and wellness. Ask your health care provider about what schedule of regular examinations is right for you. What should I know about weight and diet?  Eat a Healthy Diet  Eat plenty of vegetables, fruits, whole grains, low-fat dairy products, and lean protein.  Do not eat a lot of foods high in solid fats, added sugars, or salt.  Maintain a Healthy Weight Regular exercise can help you achieve or maintain a healthy weight. You should:  Do at least 150 minutes of exercise each week. The exercise should increase your heart rate and make you sweat (moderate-intensity exercise).  Do strength-training exercises at least twice a week.  Watch Your Levels of Cholesterol and Blood Lipids  Have your blood tested for lipids and cholesterol every 5 years starting at 76 years of age. If you are at high risk for heart disease, you should start having your blood tested when you are 76 years old. You may need to have your cholesterol levels checked more often if: ? Your lipid or cholesterol levels are high. ? You are older than 76 years of age. ? You are at high risk for heart disease.  What should I know about cancer screening? Many types of cancers can be detected early and may often be prevented. Lung Cancer  You should be screened every year for lung cancer if: ? You are a current smoker who has smoked for at least 30 years. ? You are a former smoker who has quit within the past 15 years.  Talk to your health care provider about your screening options, when you should start screening, and how often you should be screened.  Colorectal Cancer  Routine colorectal cancer screening usually begins at 76 years of age and should be  repeated every 5-10 years until you are 76 years old. You may need to be screened more often if early forms of precancerous polyps or small growths are found. Your health care provider may recommend screening at an earlier age if you have risk factors for colon cancer.  Your health care provider may recommend using home test kits to check for hidden blood in the stool.  A small camera at the end of a tube can be used to examine your colon (sigmoidoscopy or colonoscopy). This checks for the earliest forms of colorectal cancer.  Prostate and Testicular Cancer  Depending on your age and overall health, your health care provider may do certain tests to screen for prostate and testicular cancer.  Talk to your health care provider about any symptoms or concerns you have about testicular or prostate cancer.  Skin Cancer  Check your skin from head to toe regularly.  Tell your health care provider about any new moles or changes in moles, especially if: ? There is a change in a mole's size, shape, or color. ? You have a mole that is larger than a pencil eraser.  Always use sunscreen. Apply sunscreen liberally and repeat throughout the day.  Protect yourself by wearing long sleeves, pants, a wide-brimmed hat, and sunglasses when outside.  What should I know about heart disease, diabetes, and high blood pressure?  If you are 72-38 years of age, have your blood pressure checked every 3-5 years. If you are 45 years of age or older, have your blood pressure checked every year. You should have your blood pressure measured twice-once when you are at a hospital or clinic, and once when you are not at a hospital or clinic. Record the average of the two measurements. To check your blood pressure when you are not at a hospital or clinic, you can use: ? An automated blood pressure machine at  a pharmacy. ? A home blood pressure monitor.  Talk to your health care provider about your target blood  pressure.  If you are between 66-24 years old, ask your health care provider if you should take aspirin to prevent heart disease.  Have regular diabetes screenings by checking your fasting blood sugar level. ? If you are at a normal weight and have a low risk for diabetes, have this test once every three years after the age of 33. ? If you are overweight and have a high risk for diabetes, consider being tested at a younger age or more often.  A one-time screening for abdominal aortic aneurysm (AAA) by ultrasound is recommended for men aged 31-75 years who are current or former smokers. What should I know about preventing infection? Hepatitis B If you have a higher risk for hepatitis B, you should be screened for this virus. Talk with your health care provider to find out if you are at risk for hepatitis B infection. Hepatitis C Blood testing is recommended for:  Everyone born from [birth year] through 1964.  Anyone with known risk factors for hepatitis C.  Sexually Transmitted Diseases (STDs)  You should be screened each year for STDs including gonorrhea and chlamydia if: ? You are sexually active and are younger than 76 years of age. ? You are older than 76 years of age and your health care provider tells you that you are at risk for this type of infection. ? Your sexual activity has changed since you were last screened and you are at an increased risk for chlamydia or gonorrhea. Ask your health care provider if you are at risk.  Talk with your health care provider about whether you are at high risk of being infected with HIV. Your health care provider may recommend a prescription medicine to help prevent HIV infection.  What else can I do?  Schedule regular health, dental, and eye exams.  Stay current with your vaccines (immunizations).  Do not use any tobacco products, such as cigarettes, chewing tobacco, and e-cigarettes. If you need help quitting, ask your health care  provider.  Limit alcohol intake to no more than 2 drinks per day. One drink equals 12 ounces of beer, 5 ounces of wine, or 1 ounces of hard liquor.  Do not use street drugs.  Do not share needles.  Ask your health care provider for help if you need support or information about quitting drugs.  Tell your health care provider if you often feel depressed.  Tell your health care provider if you have ever been abused or do not feel safe at home. This information is not intended to replace advice given to you by your health care provider. Make sure you discuss any questions you have with your health care provider. Document Released: 06/24/2007 Document Revised: 08/25/2015 Document Reviewed: 09/29/2014 Elsevier Interactive Patient Education  2018 [employer].   Hearing Loss Hearing loss is a partial or total loss of the ability to hear. This can be temporary or permanent, and it can happen in one or both ears. Hearing loss may be referred to as deafness. Medical care is necessary to treat hearing loss properly and to prevent the condition from getting worse. Your hearing may partially or completely come back, depending on what caused your hearing loss and how severe it is. In some cases, hearing loss is permanent. What are the causes? Common causes of hearing loss include:  Too much wax in the ear canal.  Infection  of the ear canal or middle ear.  Fluid in the middle ear.  Injury to the ear or surrounding area.  An object stuck in the ear.  Prolonged exposure to loud sounds, such as music.  Less common causes of hearing loss include:  Tumors in the ear.  Viral or bacterial infections, such as meningitis.  A hole in the eardrum (perforated eardrum).  Problems with the hearing nerve that sends signals between the brain and the ear.  Certain medicines.  What are the signs or symptoms? Symptoms of this condition may include:  Difficulty telling the difference between  sounds.  Difficulty following a conversation when there is background noise.  Lack of response to sounds in your environment. This may be most noticeable when you do not respond to startling sounds.  Needing to turn up the volume on the television, radio, etc.  Ringing in the ears.  Dizziness.  Pain in the ears.  How is this diagnosed? This condition is diagnosed based on a physical exam and a hearing test (audiometry). The audiometry test will be performed by a hearing specialist (audiologist). You may also be referred to an ear, nose, and throat (ENT) specialist (otolaryngologist). How is this treated? Treatment for recent onset of hearing loss may include:  Ear wax removal.  Being prescribed medicines to prevent infection (antibiotics).  Being prescribed medicines to reduce inflammation (corticosteroids).  Follow these instructions at home:  If you were prescribed an antibiotic medicine, take it as told by your health care provider. Do not stop taking the antibiotic even if you start to feel better.  Take over-the-counter and prescription medicines only as told by your health care provider.  Avoid loud noises.  Return to your normal activities as told by your health care provider. Ask your health care provider what activities are safe for you.  Keep all follow-up visits as told by your health care provider. This is important. Contact a health care provider if:  You feel dizzy.  You develop new symptoms.  You vomit or feel nauseous.  You have a fever. Get help right away if:  You develop sudden changes in your vision.  You have severe ear pain.  You have new or increased weakness.  You have a severe headache. This information is not intended to replace advice given to you by your health care provider. Make sure you discuss any questions you have with your health care provider. Document Released: 12/26/2004 Document Revised: 06/03/2015 Document Reviewed:  05/13/2014 Elsevier Interactive Patient Education  2018 [employer].

## 2017-09-11 NOTE — Assessment & Plan Note (Signed)
No changes in current management, will follow labs done today and will give further recommendations accordingly. Follow-up in 12 months. 

## 2017-09-11 NOTE — Assessment & Plan Note (Signed)
Adequately controlled. Continue metoprolol succinate 50 mg daily and Cozaar 75 mg daily. Continue low-salt diet. Eye exam is current, next appointment in 12/2017. Follow-up in 6 months.

## 2017-09-11 NOTE — Patient Instructions (Addendum)
A few things to remember from today's visit:   Routine general medical examination at a health care facility  HYPERCHOLESTEROLEMIA - Plan: atorvastatin (LIPITOR) 20 MG tablet, Comprehensive metabolic panel, Lipid panel  Essential hypertension - Plan: metoprolol succinate (TOPROL-XL) 50 MG 24 hr tablet, losartan (COZAAR) 50 MG tablet, Comprehensive metabolic panel  Recurrent herpes labialis - Plan: valACYclovir (VALTREX) 1000 MG tablet  Coronary artery disease involving native coronary artery of native heart without angina pectoris - Plan: nitroGLYCERIN (NITROSTAT) 0.4 MG SL tablet  A few tips:  -As we age balance is not as good as it was, so there is a higher risks for falls. Please remove small rugs and furniture that is "in your way" and could increase the risk of falls. Stretching exercises may help with fall prevention: Yoga and Tai Chi are some examples. Low impact exercise is better, so you are not very achy the next day.  -Sun screen and avoidance of direct sun light recommended. Caution with dehydration, if working outdoors be sure to drink enough fluids.  - Some medications are not safe as we age, increases the risk of side effects and can potentially interact with other medication you are also taken;  including some of over the counter medications. Be sure to let me know when you start a new medication even if it is a dietary/vitamin supplement.   -Healthy diet low in red meet/animal fat and sugar + regular physical activity is recommended.      Please be sure medication list is accurate. If a new problem present, please set up appointment sooner than planned today.

## 2017-09-11 NOTE — Assessment & Plan Note (Signed)
Asymptomatic. Instructed about warning signs. Continue following with Dr. Tamala Julian, next appointment 07/2018.

## 2017-09-11 NOTE — Assessment & Plan Note (Signed)
Stable overall. No changes recorded management. Follow-up in a year.

## 2017-09-13 LAB — LIPID PANEL
Cholesterol: 138 mg/dL (ref 0–200)
HDL: 56.9 mg/dL (ref >39.00)
LDL Cholesterol: 60 mg/dL (ref 0–99)
NonHDL: 81.14
Total CHOL/HDL Ratio: 2
Triglycerides: 106 mg/dL (ref 0.0–149.0)
VLDL: 21.2 mg/dL (ref 0.0–40.0)

## 2017-09-15 ENCOUNTER — Encounter: Payer: Self-pay | Admitting: Family Medicine

## 2017-09-18 DIAGNOSIS — H9113 Presbycusis, bilateral: Secondary | ICD-10-CM | POA: Diagnosis not present

## 2017-09-18 DIAGNOSIS — H903 Sensorineural hearing loss, bilateral: Secondary | ICD-10-CM | POA: Diagnosis not present

## 2017-09-19 NOTE — Progress Notes (Addendum)
Dr. Martinique, Please review and sign AWV completed after you seen the patient. William York

## 2017-09-21 NOTE — Progress Notes (Signed)
I have reviewed documentation from this visit and I agree with recommendations given.  Evelyna Folker G. Johnthomas Lader, MD  [facility]. [place] office.   

## 2017-11-05 ENCOUNTER — Ambulatory Visit: Payer: Medicare Other | Admitting: Interventional Cardiology

## 2017-11-15 ENCOUNTER — Encounter: Payer: Self-pay | Admitting: Cardiology

## 2017-11-15 ENCOUNTER — Ambulatory Visit: Payer: Medicare Other | Admitting: Cardiology

## 2017-11-15 VITALS — BP 132/70 | HR 77 | Ht 70.0 in | Wt 191.0 lb

## 2017-11-15 DIAGNOSIS — R42 Dizziness and giddiness: Secondary | ICD-10-CM | POA: Diagnosis not present

## 2017-11-15 DIAGNOSIS — I10 Essential (primary) hypertension: Secondary | ICD-10-CM

## 2017-11-15 DIAGNOSIS — R002 Palpitations: Secondary | ICD-10-CM | POA: Diagnosis not present

## 2017-11-15 DIAGNOSIS — I493 Ventricular premature depolarization: Secondary | ICD-10-CM | POA: Diagnosis not present

## 2017-11-15 DIAGNOSIS — E7800 Pure hypercholesterolemia, unspecified: Secondary | ICD-10-CM

## 2017-11-15 DIAGNOSIS — I2510 Atherosclerotic heart disease of native coronary artery without angina pectoris: Secondary | ICD-10-CM

## 2017-11-15 LAB — COMPREHENSIVE METABOLIC PANEL
ALT: 23 IU/L (ref 0–44)
AST: 21 IU/L (ref 0–40)
Albumin/Globulin Ratio: 2 (ref 1.2–2.2)
Albumin: 4.7 g/dL (ref 3.5–4.8)
Alkaline Phosphatase: 79 IU/L (ref 39–117)
BUN/Creatinine Ratio: 24 (ref 10–24)
BUN: 22 mg/dL (ref 8–27)
Bilirubin Total: 0.7 mg/dL (ref 0.0–1.2)
CO2: 21 mmol/L (ref 20–29)
Calcium: 10.1 mg/dL (ref 8.6–10.2)
Chloride: 104 mmol/L (ref 96–106)
Creatinine, Ser: 0.93 mg/dL (ref 0.76–1.27)
GFR calc Af Amer: 92 mL/min/{1.73_m2} (ref >59)
GFR calc non Af Amer: 79 mL/min/{1.73_m2} (ref >59)
Globulin, Total: 2.3 g/dL (ref 1.5–4.5)
Glucose: 90 mg/dL (ref 65–99)
Potassium: 4.8 mmol/L (ref 3.5–5.2)
Sodium: 142 mmol/L (ref 134–144)
Total Protein: 7 g/dL (ref 6.0–8.5)

## 2017-11-15 LAB — CBC
Hematocrit: 46.1 % (ref 37.5–51.0)
Hemoglobin: 15.8 g/dL (ref 13.0–17.7)
MCH: 31.6 pg (ref 26.6–33.0)
MCHC: 34.3 g/dL (ref 31.5–35.7)
MCV: 92 fL (ref 79–97)
Platelets: 235 10*3/uL (ref 150–450)
RBC: 5 x10E6/uL (ref 4.14–5.80)
RDW: 12 % — ABNORMAL LOW (ref 12.3–15.4)
WBC: 8.3 10*3/uL (ref 3.4–10.8)

## 2017-11-15 LAB — MAGNESIUM: Magnesium: 2.2 mg/dL (ref 1.6–2.3)

## 2017-11-15 LAB — TSH: TSH: 1.43 u[IU]/mL (ref 0.450–4.500)

## 2017-11-15 MED ORDER — METOPROLOL SUCCINATE ER 50 MG PO TB24: ORAL_TABLET | ORAL | 3 refills | Status: DC

## 2017-11-15 NOTE — Patient Instructions (Addendum)
Medication Instructions:  Your physician has recommended you make the following change in your medication:   1. INCREASE METOPROLOL TO 1.5  TABLETS DAILY   If you need a refill on your cardiac medications before your next appointment, please call your pharmacy.   Lab work: TODAY: MAGNESIUM, CBC, TSH, CMET If you have labs (blood work) drawn today and your tests are completely normal, you will receive your results only by: Marland Kitchen MyChart Message (if you have MyChart) OR . A paper copy in the mail If you have any lab test that is abnormal or we need to change your treatment, we will call you to review the results.  Testing/Procedures: Your physician has requested that you have an echocardiogram. Echocardiography is a painless test that uses sound waves to create images of your heart. It provides your doctor with information about the size and shape of your heart and how well your heart's chambers and valves are working. This procedure takes approximately one hour. There are no restrictions for this procedure.  Your physician has requested that you have en exercise stress myoview. For further information please visit [email or web address]. Please follow instruction sheet, as given.  Your physician has recommended that you wear a 24-HOUR holter monitor. Holter monitors are medical devices that record the heart's electrical activity. Doctors most often use these monitors to diagnose arrhythmias. Arrhythmias are problems with the speed or rhythm of the heartbeat. The monitor is a small, portable device. You can wear one while you do your normal daily activities. This is usually used to diagnose what is causing palpitations/syncope (passing out).   Follow-Up: At [facility], you and your health needs are our priority.  As part of our continuing mission to provide you with exceptional heart care, we have created designated Provider Care Teams.  These Care Teams include your primary Cardiologist  (physician) and Advanced Practice Providers (APPs -  Physician Assistants and Nurse Practitioners) who all work together to provide you with the care you need, when you need it. You will need a follow up appointment in 2 weeks.   You may see DR. SMITH or one of the following Engineer, manufacturing Providers on your designated Care Team:   Truitt Merle, NP Cecilie Kicks, NP . Kathyrn Drown, NP  Any Other Special Instructions Will Be Listed Below (If Applicable).

## 2017-11-15 NOTE — Progress Notes (Signed)
Cardiology Office Note   Date:  11/15/2017   ID:  [name], DOB 1940-03-25, MRN [id number]  PCP:  Martinique, [name], MD  Cardiologist:  Dr. Tamala Julian    Chief Complaint  Patient presents with  . Coronary Artery Disease    CABG 1999      History of Present Illness: William York is a 77 y.o. male who presents for CAD.    CAD with bypass surgery in 1999  because of EKG abnormalities and to establish for longitudinal care. HTN, HLD.   He had multivessel coronary bypass in 1999.Marland Kitchen Last year a nuc study was done and low risk.  Normal perfusion.  And EF 53%.    Today he feels well, no chest pain and no SOB, maybe some mild dizziness for last couple of days.  He is active plays golf and walks 18 holes.  Goes to the gym, has not had complaints.   His EKG is with big. PVCs today in and out.  Pt is not aware of these beats.  He notes prior to CABG only symptom was numbness in fingers and he does not have this now.    Past Medical History:  Diagnosis Date  . Allergy   . CAD (coronary artery disease)   . ED (erectile dysfunction)   . Hyperlipidemia   . Hypertension   . Internal hemorrhoids   . OA (osteoarthritis)     Past Surgical History:  Procedure Laterality Date  . CORONARY ARTERY BYPASS GRAFT  June 1999  . HERNIA REPAIR  May 2006     Current Outpatient Medications  Medication Sig Dispense Refill  . aspirin EC 81 MG tablet Take 1 tablet (81 mg total) by mouth daily. 90 tablet 3  . atorvastatin (LIPITOR) 20 MG tablet Take 1 tablet (20 mg total) by mouth daily. 90 tablet 3  . losartan (COZAAR) 50 MG tablet Take 1.5 tablets (75 mg total) by mouth daily. 135 tablet 2  . metoprolol succinate (TOPROL-XL) 50 MG 24 hr tablet TAKE 1 TABLET BY MOUTH  DAILY WITH OR IMMEDIATLEY  FOLLOWING A MEAL 90 tablet 2  . nitroGLYCERIN (NITROSTAT) 0.4 MG SL tablet Place 1 tablet (0.4 mg total) under the tongue every 5 (five) minutes as needed. 25 tablet 0  . valACYclovir (VALTREX) 1000 MG  tablet Take 2 tabs upon acute onset and repeat dose in 12 hours. 16 tablet 1   No current facility-administered medications for this visit.     Allergies:   Patient has no known allergies.    Social History:  The patient  reports that he has never smoked. He has never used smokeless tobacco. He reports that he drinks alcohol. He reports that he does not use drugs.   Family History:  The patient's family history includes Colon cancer in his cousin; Stroke in his father.    ROS:  General:no colds or fevers, no weight changes Skin:no rashes or ulcers HEENT:no blurred vision, no congestion CV:see HPI PUL:see HPI GI:no diarrhea constipation or melena, no indigestion GU:no hematuria, no dysuria MS:no joint pain, no claudication Neuro:no syncope, no lightheadedness Endo:no diabetes, no thyroid disease  Wt Readings from Last 3 Encounters:  11/15/17 191 lb (86.6 kg)  09/11/17 191 lb (86.6 kg)  09/11/17 191 lb 8 oz (86.9 kg)     PHYSICAL EXAM: VS:  BP 132/70   Pulse 77   Ht 5\' 10"  (1.778 m)   Wt 191 lb (86.6 kg)   SpO2 96%  BMI 27.41 kg/m  , BMI Body mass index is 27.41 kg/m. General:Pleasant affect, NAD Skin:Warm and dry, brisk capillary refill HEENT:normocephalic, sclera clear, mucus membranes moist Neck:supple, no JVD, no bruits  Heart:S1S2 RRR and premature beats with soft systolic murmur, No gallup, rub or click Lungs:clear without rales, rhonchi, or wheezes QKM:MNOT, non tender, + BS, do not palpate liver spleen or masses Ext:no lower ext edema, 2+ pedal pulses, 2+ radial pulses Neuro:alert and oriented X 3, MAE, follows commands, + facial symmetry    EKG:  EKG is ordered today. The ekg ordered today demonstrates SR with big PVS at times.  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 PVCs are new   Recent Labs: 09/11/2017: ALT 21; BUN 22; Creatinine, Ser 0.88; Potassium 4.5; Sodium 143    Lipid Panel    Component Value Date/Time   CHOL 138 09/11/2017 0753   TRIG 106.0  09/11/2017 0753   HDL 56.90 09/11/2017 0753   CHOLHDL 2 09/11/2017 0753   VLDL 21.2 09/11/2017 0753   LDLCALC 60 09/11/2017 0753       Other studies Reviewed: Additional studies/ records that were reviewed today include: . Stress test 11/16/16 Study Highlights     Nuclear stress EF: 53%.  The study is normal.  This is a low risk study.  Blood pressure demonstrated a normal response to exercise.  There was no ST segment deviation noted during stress.  No T wave inversion was noted during stress.   Low risk stress nuclear study with normal perfusion and borderline low left ventricular global systolic function.       ASSESSMENT AND PLAN:  1.  CAD with hx of CABG 1999, cannot find op report. No chest pain or SOB. nuc last year was neg for ischemia.    2.  Big. PVCs new, symptomatic, pt has had some mild dizziness last 2 days.  Concern with 76 yr old grafts.  Discussed with DOD, Dr. Radford Pax and will increase toprol XL to 75 mg daily.  Check CMP, CBC, TSH, and Mg+.  Will have him wear 24 hr holter to see PVC burden.  Will do echo and exercise myoview.  Follow up with Dr. Tamala Julian in 2 weeks.   3.  HLD LDL was 60 on last labs in Sept 2019  4.  HTN controlled.    Current medicines are reviewed with the patient today.  The patient Has no concerns regarding medicines.  The following changes have been made:  See above Labs/ tests ordered today include:see above  Disposition:   FU:  see above  Signed, Cecilie Kicks, NP  11/15/2017 10:30 AM    [facility], [place], [place], [place] Phone: [phone]; Fax: [phone]

## 2017-11-16 ENCOUNTER — Telehealth (HOSPITAL_COMMUNITY): Payer: Self-pay

## 2017-11-16 NOTE — Telephone Encounter (Signed)
Encounter complete. 

## 2017-11-20 ENCOUNTER — Ambulatory Visit (HOSPITAL_COMMUNITY)
Admission: RE | Admit: 2017-11-20 | Discharge: 2017-11-20 | Disposition: A | Discharge status: Home / Self Care | Payer: Medicare Other | Source: Ambulatory Visit | Attending: Internal Medicine | Admitting: Internal Medicine

## 2017-11-20 DIAGNOSIS — R002 Palpitations: Secondary | ICD-10-CM | POA: Insufficient documentation

## 2017-11-20 LAB — MYOCARDIAL PERFUSION IMAGING
Estimated workload: 11.3 METS
Exercise duration (min): 9 min
Exercise duration (sec): 46 s
LV dias vol: 91 mL (ref 62–150)
LV sys vol: 41 mL
MPHR: 144 {beats}/min
Peak HR: 136 {beats}/min
Percent HR: 94 %
RPE: 17
Rest HR: 74 {beats}/min
SDS: 1
SRS: 4
SSS: 5
TID: 0.85

## 2017-11-20 MED ORDER — TECHNETIUM TC 99M TETROFOSMIN IV KIT
29.7000 | PACK | Freq: Once | INTRAVENOUS | Status: AC | PRN
  Administered 2017-11-20: 29.7 via INTRAVENOUS
  Filled 2017-11-20: qty 30

## 2017-11-20 MED ORDER — TECHNETIUM TC 99M TETROFOSMIN IV KIT
10.9000 | PACK | Freq: Once | INTRAVENOUS | Status: AC | PRN
  Administered 2017-11-20: 10.9 via INTRAVENOUS
  Filled 2017-11-20: qty 11

## 2017-11-21 ENCOUNTER — Ambulatory Visit (INDEPENDENT_AMBULATORY_CARE_PROVIDER_SITE_OTHER): Payer: Medicare Other

## 2017-11-21 ENCOUNTER — Ambulatory Visit (HOSPITAL_COMMUNITY): Payer: Medicare Other | Attending: Cardiology

## 2017-11-21 ENCOUNTER — Other Ambulatory Visit: Payer: Self-pay

## 2017-11-21 DIAGNOSIS — I493 Ventricular premature depolarization: Secondary | ICD-10-CM

## 2017-11-21 DIAGNOSIS — R002 Palpitations: Secondary | ICD-10-CM

## 2017-11-21 DIAGNOSIS — I10 Essential (primary) hypertension: Secondary | ICD-10-CM | POA: Diagnosis not present

## 2017-11-21 DIAGNOSIS — E785 Hyperlipidemia, unspecified: Secondary | ICD-10-CM | POA: Diagnosis not present

## 2017-11-21 DIAGNOSIS — I2510 Atherosclerotic heart disease of native coronary artery without angina pectoris: Secondary | ICD-10-CM | POA: Diagnosis not present

## 2017-11-26 ENCOUNTER — Ambulatory Visit: Payer: Medicare Other | Admitting: Cardiology

## 2017-11-26 ENCOUNTER — Encounter: Payer: Self-pay | Admitting: Cardiology

## 2017-11-26 ENCOUNTER — Encounter (HOSPITAL_COMMUNITY): Payer: Medicare Other

## 2017-11-26 VITALS — BP 126/70 | HR 66 | Ht 70.0 in | Wt 194.1 lb

## 2017-11-26 DIAGNOSIS — I2510 Atherosclerotic heart disease of native coronary artery without angina pectoris: Secondary | ICD-10-CM

## 2017-11-26 DIAGNOSIS — I493 Ventricular premature depolarization: Secondary | ICD-10-CM | POA: Diagnosis not present

## 2017-11-26 NOTE — Patient Instructions (Signed)
Medication Instructions:  Your physician recommends that you continue on your current medications as directed. Please refer to the Current Medication list given to you today.  If you need a refill on your cardiac medications before your next appointment, please call your pharmacy.   Lab work: None  If you have labs (blood work) drawn today and your tests are completely normal, you will receive your results only by: Marland Kitchen MyChart Message (if you have MyChart) OR . A paper copy in the mail If you have any lab test that is abnormal or we need to change your treatment, we will call you to review the results.  Testing/Procedures: None  Follow-Up: Your physician wants you to follow-up in: 1 year with Dr. Gaspar Bidding will receive a reminder letter in the mail two months in advance. If you don't receive a letter, please call our office to schedule the follow-up appointment.   Any Other Special Instructions Will Be Listed Below (If Applicable).

## 2017-11-26 NOTE — Progress Notes (Signed)
Cardiology Office Note   Date:  11/26/2017   ID:  William York, DOB 03-Dec-1940, MRN [id number]  PCP:  Martinique, [name], MD  Cardiologist:  Dr. Tamala Julian     Chief Complaint  Patient presents with  . Dizziness      History of Present Illness: William York is a 77 y.o. male who presents for follow up PVCs  CADwith bypass surgery in 1999 because of EKG abnormalities and to establish for longitudinal care. HTN, HLD.   He had multivessel coronary bypass in 1999.Marland Kitchen Last year a nuc study was done and low risk.  Normal perfusion.  And EF 53%.   On last visit was in big. PVCs. I increased toprol xl to 75 mg daily.   holter with PVC burden <3% Echo EF 55-60%  Stress test low risk.    Today only dizziness when standing up initially on occ.  Can walk on treadmill for 45 min without problems.  His wife is with him today.  All tests reassuring.     Past Medical History:  Diagnosis Date  . Allergy   . CAD (coronary artery disease)   . ED (erectile dysfunction)   . Hyperlipidemia   . Hypertension   . Internal hemorrhoids   . OA (osteoarthritis)     Past Surgical History:  Procedure Laterality Date  . CORONARY ARTERY BYPASS GRAFT  June 1999  . HERNIA REPAIR  May 2006     Current Outpatient Medications  Medication Sig Dispense Refill  . aspirin EC 81 MG tablet Take 1 tablet (81 mg total) by mouth daily. 90 tablet 3  . atorvastatin (LIPITOR) 20 MG tablet Take 1 tablet (20 mg total) by mouth daily. 90 tablet 3  . losartan (COZAAR) 50 MG tablet Take 1.5 tablets (75 mg total) by mouth daily. 135 tablet 2  . metoprolol succinate (TOPROL-XL) 50 MG 24 hr tablet TAKE 1.5 TABLETS BY MOUTH  DAILY WITH OR IMMEDIATLEY  FOLLOWING A MEAL 135 tablet 3  . nitroGLYCERIN (NITROSTAT) 0.4 MG SL tablet Place 1 tablet (0.4 mg total) under the tongue every 5 (five) minutes as needed. 25 tablet 0  . valACYclovir (VALTREX) 1000 MG tablet Take 2 tabs upon acute onset and repeat dose in 12 hours. 16  tablet 1   No current facility-administered medications for this visit.     Allergies:   Patient has no known allergies.    Social History:  The patient  reports that he has never smoked. He has never used smokeless tobacco. He reports that he drinks alcohol. He reports that he does not use drugs.   Family History:  The patient's family history includes Colon cancer in his cousin; Stroke in his father.    ROS:  General:no colds or fevers, no weight changes Skin:no rashes or ulcers HEENT:no blurred vision, no congestion CV:see HPI PUL:see HPI GI:no diarrhea constipation or melena, no indigestion Neuro:no syncope, occ lightheadedness   Wt Readings from Last 3 Encounters:  11/26/17 194 lb 1.9 oz (88.1 kg)  11/20/17 191 lb (86.6 kg)  11/15/17 191 lb (86.6 kg)     PHYSICAL EXAM: VS:  BP 126/70   Pulse 66   Ht 5\' 10"  (1.778 m)   Wt 194 lb 1.9 oz (88.1 kg)   SpO2 95%   BMI 27.85 kg/m  , BMI Body mass index is 27.85 kg/m. General:Pleasant affect, NAD Skin:Warm and dry, brisk capillary refill HEENT:normocephalic, sclera clear, mucus membranes moist Heart:S1S2 RRR without murmur, gallup,  rub or click Lungs:clear without rales, rhonchi, or wheezes RXV:QMGQ, non tender, + BS, do not palpate liver spleen or masses Neuro:alert and oriented X 3, MAE, follows commands, + facial symmetry    EKG:  EKG is NOT ordered today.   Recent Labs: 11/15/2017: ALT 23; BUN 22; Creatinine, Ser 0.93; Hemoglobin 15.8; Magnesium 2.2; Platelets 235; Potassium 4.8; Sodium 142; TSH 1.430    Lipid Panel    Component Value Date/Time   CHOL 138 09/11/2017 0753   TRIG 106.0 09/11/2017 0753   HDL 56.90 09/11/2017 0753   CHOLHDL 2 09/11/2017 0753   VLDL 21.2 09/11/2017 0753   LDLCALC 60 09/11/2017 0753       Other studies Reviewed: Additional studies/ records that were reviewed today include:  holter 11/21/17. Study Highlights     Basic underlying rhythm is normal sinus.  Rare PACs  with one 4 beat run  Rare PVCs with less than 3% burden.  Occasional ventricular bigeminy.  No complaints to correlate with dysrhythmia noted.  No atrial fibrillation or pauses greater than 3 seconds.   Minimum HR: 50 BPM at 1:02:54 AM Maximum HR: 94 BPM at 10:41:35 AM Average HR: 68 BPM   Echo 11/21/17 Study Conclusions  - Left ventricle: The cavity size was normal. There was mild   concentric hypertrophy. Systolic function was normal. The   estimated ejection fraction was in the range of 55% to 60%. Wall   motion was normal; there were no regional wall motion   abnormalities. Left ventricular diastolic function parameters   were normal. - Aortic valve: Trileaflet; mildly thickened, mildly calcified   leaflets. There was trivial regurgitation. - Aorta: Fibrocalcific change was present in the root. - Mitral valve: Mildly thickened leaflets . There was trivial   regurgitation. - Left atrium: The atrium was moderately dilated. - Right ventricle: Systolic function was low normal. - Atrial septum: No defect or patent foramen ovale was identified. - Tricuspid valve: There was trivial regurgitation. - Pulmonic valve: There was mild regurgitation.  Impressions:  - Normal LV systolic and diastolic function. No wall motion   abnormalities. Mild PR. Aortic root calcification   Exercise stress 11/20/17 Study Highlights     The left ventricular ejection fraction is normal (55-65%).  Nuclear stress EF: 55%.  Blood pressure demonstrated a normal response to exercise.  There was no ST segment deviation noted during stress.  The study is normal.  This is a low risk study.   Normal resting and stress perfusion. No ischemia or infarction EF 55% frequent PVC with stress and in recovery     ASSESSMENT AND PLAN:  1.  freq PVCs on OV, with no ischemia, EF normal and only 3% of PVCs on holter.  All reassuring, continue toprol XL at 75 mg daily.    2.  CAD with hx CABG in  1999.  Stable.  If any symptoms chest discomfort or indigestion call for appt otherwise see Dr. Tamala Julian in 1 year.    Current medicines are reviewed with the patient today.  The patient Has no concerns regarding medicines.  The following changes have been made:  See above Labs/ tests ordered today include:see above  Disposition:   FU:  see above  Signed, Cecilie Kicks, NP  11/26/2017 2:29 PM    [facility] [place], [place], [address] [place], [place] Phone: [phone]; Fax: [phone]

## 2017-12-20 DIAGNOSIS — H2513 Age-related nuclear cataract, bilateral: Secondary | ICD-10-CM | POA: Diagnosis not present

## 2018-03-06 ENCOUNTER — Ambulatory Visit (INDEPENDENT_AMBULATORY_CARE_PROVIDER_SITE_OTHER): Payer: Medicare Other | Admitting: Family Medicine

## 2018-03-06 ENCOUNTER — Encounter: Payer: Self-pay | Admitting: Family Medicine

## 2018-03-06 VITALS — BP 120/77 | HR 64 | Temp 98.4°F | Resp 16 | Ht 70.0 in | Wt 198.1 lb

## 2018-03-06 DIAGNOSIS — J309 Allergic rhinitis, unspecified: Secondary | ICD-10-CM | POA: Diagnosis not present

## 2018-03-06 DIAGNOSIS — R0789 Other chest pain: Secondary | ICD-10-CM | POA: Diagnosis not present

## 2018-03-06 DIAGNOSIS — R059 Cough, unspecified: Secondary | ICD-10-CM

## 2018-03-06 DIAGNOSIS — R05 Cough: Secondary | ICD-10-CM | POA: Diagnosis not present

## 2018-03-06 MED ORDER — BENZONATATE 100 MG PO CAPS: 200.0000 mg | ORAL_CAPSULE | Freq: Two times a day (BID) | ORAL | 0 refills | Status: AC | PRN

## 2018-03-06 NOTE — Patient Instructions (Addendum)
A few things to remember from today's visit:   Chest wall pain  Cough  Allergic rhinitis, unspecified seasonality, unspecified trigger  Be careful with cold medications. Plain Mucinex may help. Nasal irrigation with saline as needed. Continue using nasal spray. Monitor for fever or worsening cough.   Chest pain seems to be related with muscle around your chest. You can take acetaminophen 500 mg 2-3 times per day if needed.  Please be sure medication list is accurate. If a new problem present, please set up appointment sooner than planned today.

## 2018-03-06 NOTE — Progress Notes (Signed)
ACUTE VISIT  HPI:  Chief Complaint  Patient presents with  . Cough    cough with mucus sometimes that started a couple of days ago, that is causing chest discomfort    William York is a 78 y.o.male here today complaining of 2 days of respiratory symptoms.  Cough sometimes productive,"not much" sputum,denies hemoptysis. Negative for fever,chills,orbody aches.   Mild rhinorrhea and nasal congestion. Cough causes chest wall pain. Denies dyspnea or wheezing. Cough is improving. He is not sure about exacerbating or alleviating factors.  Cough  This is a new problem. The current episode started in the past 7 days. The problem has been gradually improving. The cough is productive of sputum. Associated symptoms include nasal congestion, postnasal drip and rhinorrhea. Pertinent negatives include no chills, ear congestion, ear pain, eye redness, fever, headaches, heartburn, hemoptysis, myalgias, rash, sore throat, shortness of breath or wheezing. He has tried OTC cough suppressant for the symptoms. The treatment provided mild relief. His past medical history is significant for environmental allergies. There is no history of asthma.    No Hx of recent travel. No sick contact. No known insect bite.  OTC medications for this problem: Cold meds.  Review of Systems  Constitutional: Negative for activity change, appetite change, chills, fatigue and fever.  HENT: Positive for congestion, postnasal drip and rhinorrhea. Negative for ear pain, mouth sores, sneezing, sore throat, trouble swallowing and voice change.   Eyes: Negative for discharge, redness and itching.  Respiratory: Positive for cough. Negative for hemoptysis, shortness of breath and wheezing.   Gastrointestinal: Negative for abdominal pain, diarrhea, heartburn, nausea and vomiting.  Musculoskeletal: Negative for myalgias and neck pain.  Skin: Negative for rash.  Allergic/Immunologic: Positive for environmental  allergies.  Neurological: Negative for weakness and headaches.   Current Outpatient Medications on File Prior to Visit  Medication Sig Dispense Refill  . aspirin EC 81 MG tablet Take 1 tablet (81 mg total) by mouth daily. 90 tablet 3  . atorvastatin (LIPITOR) 20 MG tablet Take 1 tablet (20 mg total) by mouth daily. 90 tablet 3  . losartan (COZAAR) 50 MG tablet Take 1.5 tablets (75 mg total) by mouth daily. 135 tablet 2  . metoprolol succinate (TOPROL-XL) 50 MG 24 hr tablet TAKE 1.5 TABLETS BY MOUTH  DAILY WITH OR IMMEDIATLEY  FOLLOWING A MEAL 135 tablet 3  . nitroGLYCERIN (NITROSTAT) 0.4 MG SL tablet Place 1 tablet (0.4 mg total) under the tongue every 5 (five) minutes as needed. 25 tablet 0  . valACYclovir (VALTREX) 1000 MG tablet Take 2 tabs upon acute onset and repeat dose in 12 hours. 16 tablet 1   No current facility-administered medications on file prior to visit.      Past Medical History:  Diagnosis Date  . Allergy   . CAD (coronary artery disease)   . ED (erectile dysfunction)   . Hyperlipidemia   . Hypertension   . Internal hemorrhoids   . OA (osteoarthritis)    No Known Allergies  Social History   Socioeconomic History  . Marital status: Married    Spouse name: Not on file  . Number of children: Not on file  . Years of education: Not on file  . Highest education level: Not on file  Occupational History  . Not on file  Social Needs  . Financial resource strain: Not on file  . Food insecurity:    Worry: Not on file    Inability: Not on file  .  Transportation needs:    Medical: Not on file    Non-medical: Not on file  Tobacco Use  . Smoking status: Never Smoker  . Smokeless tobacco: Never Used  Substance and Sexual Activity  . Alcohol use: Yes    Comment: one daily   . Drug use: No  . Sexual activity: Not on file  Lifestyle  . Physical activity:    Days per week: Not on file    Minutes per session: Not on file  . Stress: Not on file  Relationships    . Social connections:    Talks on phone: Not on file    Gets together: Not on file    Attends religious service: Not on file    Active member of club or organization: Not on file    Attends meetings of clubs or organizations: Not on file    Relationship status: Not on file  Other Topics Concern  . Not on file  Social History Narrative   Daily caffeine     Vitals:   03/06/18 1602  BP: 120/77  Pulse: 64  Resp: 16  Temp: 98.4 F (36.9 C)  SpO2: 95%   Body mass index is 28.43 kg/m.   Physical Exam  Nursing note and vitals reviewed. Constitutional: He is oriented to person, place, and time. He appears well-developed. He does not appear ill. No distress.  HENT:  Head: Normocephalic and atraumatic.  Nose: Rhinorrhea present. Right sinus exhibits no maxillary sinus tenderness and no frontal sinus tenderness. Left sinus exhibits no maxillary sinus tenderness and no frontal sinus tenderness.  Mouth/Throat: Oropharynx is clear and moist and mucous membranes are normal.  Eyes: Conjunctivae and EOM are normal.  Cardiovascular: Normal rate and regular rhythm.  No murmur heard. Respiratory: Effort normal and breath sounds normal. No stridor. No respiratory distress.  Lymphadenopathy:    He has no cervical adenopathy.  Neurological: He is alert and oriented to person, place, and time. He has normal strength.  Skin: Skin is warm. No rash noted. No erythema.  Psychiatric: He has a normal mood and affect. His speech is normal.  Well groomed, good eye contact.    ASSESSMENT AND PLAN:  [name] was seen today for cough.  Diagnoses and all orders for this visit:  Chest wall pain Reassured. Instructed to avoid shallow breathing. Acetaminophen recommended as needed. Instructed about warning signs.  Cough Lung auscultation negative,I do not think CXR is needed today. Residual from URI vs allergies among others. Adequate hydration. Plain Mucinex may also help.  -      benzonatate (TESSALON) 100 MG capsule; Take 2 capsules (200 mg total) by mouth 2 (two) times daily as needed for up to 10 days.  Allergic rhinitis, unspecified seasonality, unspecified trigger This problem could be aggravating cough. Nasal irrigations with saline as needed. Continue intranasal steroid daily as needed. Avoid OTC decongestants.    Return if symptoms worsen or fail to improve.      G. Martinique, MD  [facility]. [place] office.

## 2018-03-12 ENCOUNTER — Ambulatory Visit: Payer: Medicare Other | Admitting: Family Medicine

## 2018-03-25 ENCOUNTER — Ambulatory Visit: Payer: Medicare Other | Admitting: Family Medicine

## 2018-04-16 ENCOUNTER — Other Ambulatory Visit: Payer: Self-pay | Admitting: Family Medicine

## 2018-04-16 DIAGNOSIS — I10 Essential (primary) hypertension: Secondary | ICD-10-CM

## 2018-04-17 ENCOUNTER — Ambulatory Visit (INDEPENDENT_AMBULATORY_CARE_PROVIDER_SITE_OTHER): Payer: Medicare Other | Admitting: Family Medicine

## 2018-04-17 ENCOUNTER — Encounter: Payer: Self-pay | Admitting: Family Medicine

## 2018-04-17 ENCOUNTER — Other Ambulatory Visit: Payer: Self-pay

## 2018-04-17 VITALS — HR 56 | Resp 16

## 2018-04-17 DIAGNOSIS — R001 Bradycardia, unspecified: Secondary | ICD-10-CM

## 2018-04-17 DIAGNOSIS — I10 Essential (primary) hypertension: Secondary | ICD-10-CM | POA: Diagnosis not present

## 2018-04-17 NOTE — Progress Notes (Addendum)
Virtual Visit via Video Note   I connected with William York on 04/17/18 at  8:00 AM EDT by a video enabled telemedicine application and verified that I am speaking with the correct person using two identifiers.  Location patient: home Location provider:work or home office Persons participating in the virtual visit: patient, provider  I discussed the limitations of evaluation and management by telemedicine and the availability of in person appointments. The patient expressed understanding and agreed to proceed.   HPI: I am seeing William York today because he needs refills on his antihypertensive medications. He is a 78 year old male with history of hypertension, hyperlipidemia, CAD, PVCs.  Last OV on 03/06/2018, when he was complaining about CP, problem resolved.  He followed with his cardiologist on 11/15/17, metoprolol succinate was increased from 50 mg to 75 mg. He is also on Cozaar 75 mg daily. He is also on metoprolol succinate He is not checking BP regularly.   Denies severe/frequent headache, visual changes, chest pain, dyspnea, palpitation, claudication, focal weakness, or edema.  Lab Results  Component Value Date   CREATININE 0.93 11/15/2017   BUN 22 11/15/2017   NA 142 11/15/2017   K 4.8 11/15/2017   CL 104 11/15/2017   CO2 21 11/15/2017    ROS: See pertinent positives and negatives per HPI.  Past Medical History:  Diagnosis Date  . Allergy   . CAD (coronary artery disease)   . ED (erectile dysfunction)   . Hyperlipidemia   . Hypertension   . Internal hemorrhoids   . OA (osteoarthritis)     Past Surgical History:  Procedure Laterality Date  . CORONARY ARTERY BYPASS GRAFT  June 1999  . HERNIA REPAIR  May 2006    Family History  Problem Relation Age of Onset  . Colon cancer Cousin        Mother side-First cousin  . Stroke Father     Social History   Socioeconomic History  . Marital status: Married    Spouse name: Not on file  . Number of children: Not  on file  . Years of education: Not on file  . Highest education level: Not on file  Occupational History  . Not on file  Social Needs  . Financial resource strain: Not on file  . Food insecurity:    Worry: Not on file    Inability: Not on file  . Transportation needs:    Medical: Not on file    Non-medical: Not on file  Tobacco Use  . Smoking status: Never Smoker  . Smokeless tobacco: Never Used  Substance and Sexual Activity  . Alcohol use: Yes    Comment: one daily   . Drug use: No  . Sexual activity: Not on file  Lifestyle  . Physical activity:    Days per week: Not on file    Minutes per session: Not on file  . Stress: Not on file  Relationships  . Social connections:    Talks on phone: Not on file    Gets together: Not on file    Attends religious service: Not on file    Active member of club or organization: Not on file    Attends meetings of clubs or organizations: Not on file    Relationship status: Not on file  . Intimate partner violence:    Fear of current or ex partner: Not on file    Emotionally abused: Not on file    Physically abused: Not on file  Forced sexual activity: Not on file  Other Topics Concern  . Not on file  Social History Narrative   Daily caffeine       Current Outpatient Medications:  .  aspirin EC 81 MG tablet, Take 1 tablet (81 mg total) by mouth daily., Disp: 90 tablet, Rfl: 3 .  atorvastatin (LIPITOR) 20 MG tablet, Take 1 tablet (20 mg total) by mouth daily., Disp: 90 tablet, Rfl: 3 .  losartan (COZAAR) 50 MG tablet, TAKE 1 AND 1/2 TABLETS BY  MOUTH DAILY, Disp: 135 tablet, Rfl: 1 .  metoprolol succinate (TOPROL-XL) 50 MG 24 hr tablet, TAKE 1 TABLET BY MOUTH  DAILY WITH OR IMMEDIATELY  FOLLOWING A MEAL, Disp: 90 tablet, Rfl: 1 .  nitroGLYCERIN (NITROSTAT) 0.4 MG SL tablet, Place 1 tablet (0.4 mg total) under the tongue every 5 (five) minutes as needed., Disp: 25 tablet, Rfl: 0 .  valACYclovir (VALTREX) 1000 MG tablet, Take 2  tabs upon acute onset and repeat dose in 12 hours., Disp: 16 tablet, Rfl: 1  EXAM:  VITALS per patient if applicable:  GENERAL: alert, oriented, appears well and in no acute distress  HEENT: atraumatic, conjunttiva clear, no obvious abnormalities on inspection of face.  NECK: normal movements of the head and neck  LUNGS: on inspection no signs of respiratory distress, breathing rate appears normal, no obvious gross SOB, gasping or wheezing  CV: no obvious cyanosis  MS: moves all visible extremities without noticeable abnormality  PSYCH/NEURO: pleasant and cooperative, no obvious depression or anxiety, speech and thought processing grossly intact  ASSESSMENT AND PLAN:  Discussed the following assessment and plan:  Essential hypertension BP today elevated at 191/60, he needs to pump cuff up manually,so moving while checking BP. Based on observation, technique is not good. For now recommend continuing Cozaar 75 mg daily and metoprolol succinate 75 mg daily. Goal is < 140/90. Recommend monitoring BP periodically, he can try later with a better technique or he may need a new monitor. Because of public health concerns and stay home recommendations, we will hold on labs and BP check in the clinic. He was clearly instructed about warning signs.   Bradycardia, sinus Mild. He is asymptomatic, so no changes on metoprolol succinate today. He will continue checking pulse manually as we did today.Instructed about warning signs.   25 min face to face, > 50% dedicated to discuss BP technique as well as side effects of his medication, especially metoprolol. We will schedule BP check as soon as situation normalized.  I discussed the assessment and treatment plan with the patient. The patient was provided an opportunity to ask questions and all were answered. The patient agreed with the plan and demonstrated an understanding of the instructions.   Return for BP check when possible. CPE in  09/2018.    William York Martinique, MD

## 2018-04-17 NOTE — Assessment & Plan Note (Addendum)
BP today elevated at 191/60, he needs to pump cuff up manually,so moving while checking BP. Based on observation, technique is not good. For now recommend continuing Cozaar 75 mg daily and metoprolol succinate 75 mg daily. Goal is < 140/90. Recommend monitoring BP periodically, he can try later with a better technique or he may need a new monitor. Because of public health concerns and stay home recommendations, we will hold on labs and BP check in the clinic. He was clearly instructed about warning signs.

## 2018-05-06 ENCOUNTER — Encounter: Payer: Self-pay | Admitting: Family Medicine

## 2018-05-10 ENCOUNTER — Other Ambulatory Visit: Payer: Self-pay | Admitting: Family Medicine

## 2018-05-10 MED ORDER — LOSARTAN POTASSIUM 100 MG PO TABS: 100.0000 mg | ORAL_TABLET | Freq: Every day | ORAL | 1 refills | Status: DC

## 2018-05-21 DIAGNOSIS — L812 Freckles: Secondary | ICD-10-CM | POA: Diagnosis not present

## 2018-05-21 DIAGNOSIS — L820 Inflamed seborrheic keratosis: Secondary | ICD-10-CM | POA: Diagnosis not present

## 2018-05-21 DIAGNOSIS — L821 Other seborrheic keratosis: Secondary | ICD-10-CM | POA: Diagnosis not present

## 2018-05-21 DIAGNOSIS — L570 Actinic keratosis: Secondary | ICD-10-CM | POA: Diagnosis not present

## 2018-05-21 DIAGNOSIS — Z85828 Personal history of other malignant neoplasm of skin: Secondary | ICD-10-CM | POA: Diagnosis not present

## 2018-05-21 DIAGNOSIS — L738 Other specified follicular disorders: Secondary | ICD-10-CM | POA: Diagnosis not present

## 2018-06-03 ENCOUNTER — Encounter: Payer: Self-pay | Admitting: Family Medicine

## 2018-06-07 ENCOUNTER — Other Ambulatory Visit: Payer: Self-pay | Admitting: Family Medicine

## 2018-06-07 ENCOUNTER — Encounter: Payer: Self-pay | Admitting: Family Medicine

## 2018-06-07 MED ORDER — AMLODIPINE BESYLATE 2.5 MG PO TABS: 2.5000 mg | ORAL_TABLET | Freq: Every day | ORAL | 1 refills | Status: DC

## 2018-07-03 ENCOUNTER — Encounter: Payer: Self-pay | Admitting: Family Medicine

## 2018-07-04 ENCOUNTER — Telehealth: Payer: Self-pay | Admitting: Family Medicine

## 2018-07-04 NOTE — Telephone Encounter (Signed)
Patient is requesting an in office appt on a BP follow up.

## 2018-07-04 NOTE — Telephone Encounter (Signed)
Returned call to patient and he scheduled for 07/15/2018.

## 2018-07-15 ENCOUNTER — Other Ambulatory Visit: Payer: Self-pay

## 2018-07-15 ENCOUNTER — Encounter: Payer: Self-pay | Admitting: Family Medicine

## 2018-07-15 ENCOUNTER — Ambulatory Visit (INDEPENDENT_AMBULATORY_CARE_PROVIDER_SITE_OTHER): Payer: Medicare Other | Admitting: Family Medicine

## 2018-07-15 VITALS — BP 122/66 | HR 82 | Temp 97.7°F | Resp 12 | Ht 70.0 in | Wt 190.0 lb

## 2018-07-15 DIAGNOSIS — I10 Essential (primary) hypertension: Secondary | ICD-10-CM | POA: Diagnosis not present

## 2018-07-15 LAB — BASIC METABOLIC PANEL
BUN: 21 mg/dL (ref 6–23)
CO2: 25 mEq/L (ref 19–32)
Calcium: 9 mg/dL (ref 8.4–10.5)
Chloride: 109 mEq/L (ref 96–112)
Creatinine, Ser: 0.74 mg/dL (ref 0.40–1.50)
GFR: 102.45 mL/min (ref >60.00)
Glucose, Bld: 83 mg/dL (ref 70–99)
Potassium: 4.3 mEq/L (ref 3.5–5.1)
Sodium: 142 mEq/L (ref 135–145)

## 2018-07-15 MED ORDER — AMLODIPINE BESYLATE 2.5 MG PO TABS: 2.5000 mg | ORAL_TABLET | Freq: Every day | ORAL | 2 refills | Status: DC

## 2018-07-15 MED ORDER — METOPROLOL SUCCINATE ER 50 MG PO TB24: ORAL_TABLET | ORAL | 1 refills | Status: DC

## 2018-07-15 NOTE — Assessment & Plan Note (Addendum)
Adequately controlled. Re-checked 130/55. BP monitors today read higher numbers but BP today consistent with BP readings at home.  No changes in current management. DASH/low salt diet recommended. Eye exam current.

## 2018-07-15 NOTE — Progress Notes (Signed)
William York is a 78 y.o.male, who is here today to follow on HTN. Last follow up visit: 04/29/18.  Treatment has been adjusted for the past couple months. Currently on Metoprolol Succinate 50 mg,Amlodipine 2.5 mg,anfd Cozaar 100 .  He is taking medications as instructed, no side effects reported.  He has not noted unusual headache, visual changes, exertional chest pain, dyspnea,  focal weakness, or edema. Home BP readings: Most reading < 140/90.   Lab Results  Component Value Date   CREATININE 0.93 11/15/2017   BUN 22 11/15/2017   NA 142 11/15/2017   K 4.8 11/15/2017   CL 104 11/15/2017   CO2 21 11/15/2017    Review of Systems  Constitutional: Negative for chills and fever.  HENT: Negative for nosebleeds and sore throat.   Skin: Negative for pallor and rash.  Psychiatric/Behavioral: Negative for confusion. The patient is not nervous/anxious.   Rest see pertinent positives and negatives per HPI.  Current Outpatient Medications on File Prior to Visit  Medication Sig Dispense Refill  . aspirin EC 81 MG tablet Take 1 tablet (81 mg total) by mouth daily. 90 tablet 3  . atorvastatin (LIPITOR) 20 MG tablet Take 1 tablet (20 mg total) by mouth daily. 90 tablet 3  . losartan (COZAAR) 100 MG tablet Take 1 tablet (100 mg total) by mouth daily. 90 tablet 1  . nitroGLYCERIN (NITROSTAT) 0.4 MG SL tablet Place 1 tablet (0.4 mg total) under the tongue every 5 (five) minutes as needed. 25 tablet 0  . valACYclovir (VALTREX) 1000 MG tablet Take 2 tabs upon acute onset and repeat dose in 12 hours. 16 tablet 1   No current facility-administered medications on file prior to visit.      Past Medical History:  Diagnosis Date  . Allergy   . CAD (coronary artery disease)   . ED (erectile dysfunction)   . Hyperlipidemia   . Hypertension   . Internal hemorrhoids   . OA (osteoarthritis)     No Known Allergies  Social History   Socioeconomic History  . Marital status: Married     Spouse name: Not on file  . Number of children: Not on file  . Years of education: Not on file  . Highest education level: Not on file  Occupational History  . Not on file  Social Needs  . Financial resource strain: Not on file  . Food insecurity    Worry: Not on file    Inability: Not on file  . Transportation needs    Medical: Not on file    Non-medical: Not on file  Tobacco Use  . Smoking status: Never Smoker  . Smokeless tobacco: Never Used  Substance and Sexual Activity  . Alcohol use: Yes    Comment: one daily   . Drug use: No  . Sexual activity: Not on file  Lifestyle  . Physical activity    Days per week: Not on file    Minutes per session: Not on file  . Stress: Not on file  Relationships  . Social Herbalist on phone: Not on file    Gets together: Not on file    Attends religious service: Not on file    Active member of club or organization: Not on file    Attends meetings of clubs or organizations: Not on file    Relationship status: Not on file  Other Topics Concern  . Not on file  Social History Narrative  Daily caffeine     Vitals:   07/15/18 0830  BP: 122/66  Pulse: 82  Resp: 12  Temp: 97.7 F (36.5 C)  SpO2: 96%   Body mass index is 27.26 kg/m.  Physical Exam  Nursing note reviewed. Constitutional: He is oriented to person, place, and time. He appears well-developed. No distress.  HENT:  Head: Normocephalic and atraumatic.  Mouth/Throat: Oropharynx is clear and moist and mucous membranes are normal.  Eyes: Pupils are equal, round, and reactive to light. Conjunctivae are normal.  Cardiovascular: Normal rate and regular rhythm.  No murmur heard. Pulses:      Dorsalis pedis pulses are 2+ on the right side and 2+ on the left side.  Respiratory: Effort normal and breath sounds normal. No respiratory distress.  GI: Soft. He exhibits no mass. There is no hepatomegaly. There is no abdominal tenderness.  Musculoskeletal:         General: Edema (Trace pitting LE edema,bilateral.) present.  Lymphadenopathy:    He has no cervical adenopathy.  Neurological: He is alert and oriented to person, place, and time. He has normal strength. No cranial nerve deficit. Gait normal.  Skin: Skin is warm. No rash noted. No erythema.  Psychiatric: He has a normal mood and affect.  Well groomed, good eye contact.    ASSESSMENT AND PLAN:   William York is here today for HTN follow up.  Orders Placed This Encounter  Procedures  . Basic metabolic panel   Lab Results  Component Value Date   CREATININE 0.74 07/15/2018   BUN 21 07/15/2018   NA 142 07/15/2018   K 4.3 07/15/2018   CL 109 07/15/2018   CO2 25 07/15/2018     Essential hypertension Adequately controlled. Re-checked 130/55. BP monitors today read higher numbers but BP today consistent with BP readings at home.  No changes in current management. DASH/low salt diet recommended. Eye exam current.   -[name] advised to return sooner than planned today if new concerns arise.     Glyn Zendejas G. Martinique, MD  [facility]. [place] office.

## 2018-07-15 NOTE — Patient Instructions (Signed)
A few things to remember from today's visit:   Essential hypertension - Plan: metoprolol succinate (TOPROL-XL) 50 MG 24 hr tablet  No changes today.  Please be sure medication list is accurate. If a new problem present, please set up appointment sooner than planned today.

## 2018-07-18 ENCOUNTER — Encounter: Payer: Self-pay | Admitting: Family Medicine

## 2018-07-21 ENCOUNTER — Encounter: Payer: Self-pay | Admitting: Family Medicine

## 2018-07-28 ENCOUNTER — Other Ambulatory Visit: Payer: Self-pay | Admitting: Family Medicine

## 2018-07-28 DIAGNOSIS — E7800 Pure hypercholesterolemia, unspecified: Secondary | ICD-10-CM

## 2018-07-29 DIAGNOSIS — H10411 Chronic giant papillary conjunctivitis, right eye: Secondary | ICD-10-CM | POA: Diagnosis not present

## 2018-07-29 DIAGNOSIS — H0012 Chalazion right lower eyelid: Secondary | ICD-10-CM | POA: Diagnosis not present

## 2018-08-21 ENCOUNTER — Encounter: Payer: Self-pay | Admitting: Family Medicine

## 2018-10-07 ENCOUNTER — Other Ambulatory Visit: Payer: Self-pay | Admitting: Family Medicine

## 2018-10-07 DIAGNOSIS — I10 Essential (primary) hypertension: Secondary | ICD-10-CM

## 2018-10-18 ENCOUNTER — Encounter: Payer: Self-pay | Admitting: Family Medicine

## 2018-11-16 ENCOUNTER — Other Ambulatory Visit: Payer: Self-pay | Admitting: Family Medicine

## 2018-11-16 DIAGNOSIS — I10 Essential (primary) hypertension: Secondary | ICD-10-CM

## 2018-11-16 DIAGNOSIS — B001 Herpesviral vesicular dermatitis: Secondary | ICD-10-CM

## 2018-11-18 NOTE — Telephone Encounter (Signed)
Okay to refill Valtrex? Also looks like Losartan was discontinued by you 05/10/2018

## 2018-11-20 ENCOUNTER — Encounter: Payer: Self-pay | Admitting: Family Medicine

## 2018-11-20 MED ORDER — LOSARTAN POTASSIUM 100 MG PO TABS: 100.0000 mg | ORAL_TABLET | Freq: Every day | ORAL | 0 refills | Status: DC

## 2018-11-26 NOTE — Progress Notes (Signed)
Cardiology Office Note:    Date:  11/27/2018   ID:  [name], DOB 1940-11-20, MRN [id number]  PCP:  Martinique, [name], MD  Cardiologist:  Sinclair Grooms, MD   Referring MD: Martinique, [name], MD   Chief Complaint  Patient presents with  . Coronary Artery Disease  . Hyperlipidemia    History of Present Illness:    William York is a 78 y.o. male with a hx of hyperlipidemia, CAD with prior 5 vessel bypass surgery 1999 (grafts unknown- Dr. Gilford Raid), hypertension, erectile dysfunction, and osteoarthritis.  He is doing well.  He denies angina.  He has not had syncope or palpitations.  There is no lower extremity swelling.  He has not had transient neurological complaints.  He walks at least 4 times per week.  He denies lower extremity edema.  He states that his bypass operation was by Dr. Caffie Pinto and that 5 vessels were revascularized.  Past Medical History:  Diagnosis Date  . Allergy   . CAD (coronary artery disease)   . ED (erectile dysfunction)   . Hyperlipidemia   . Hypertension   . Internal hemorrhoids   . OA (osteoarthritis)     Past Surgical History:  Procedure Laterality Date  . CORONARY ARTERY BYPASS GRAFT  June 1999  . HERNIA REPAIR  May 2006    Current Medications: Current Meds  Medication Sig  . amLODipine (NORVASC) 2.5 MG tablet Take 1 tablet (2.5 mg total) by mouth daily.  Marland Kitchen aspirin EC 81 MG tablet Take 1 tablet (81 mg total) by mouth daily.  Marland Kitchen atorvastatin (LIPITOR) 20 MG tablet TAKE 1 TABLET BY MOUTH  DAILY  . losartan (COZAAR) 100 MG tablet Take 1 tablet (100 mg total) by mouth daily.  . metoprolol succinate (TOPROL-XL) 50 MG 24 hr tablet TAKE 1 TABLET BY MOUTH  DAILY WITH OR IMMEDIATELY  FOLLOWING A MEAL  . nitroGLYCERIN (NITROSTAT) 0.4 MG SL tablet Place 1 tablet (0.4 mg total) under the tongue every 5 (five) minutes as needed.  . valACYclovir (VALTREX) 1000 MG tablet TAKE 2 TABLETS BY MOUTH  UPON ACUTE ONSET AND REPEAT DOSE IN 12 HOURS.      Allergies:   Patient has no known allergies.   Social History   Socioeconomic History  . Marital status: Married    Spouse name: Not on file  . Number of children: Not on file  . Years of education: Not on file  . Highest education level: Not on file  Occupational History  . Not on file  Social Needs  . Financial resource strain: Not on file  . Food insecurity    Worry: Not on file    Inability: Not on file  . Transportation needs    Medical: Not on file    Non-medical: Not on file  Tobacco Use  . Smoking status: Never Smoker  . Smokeless tobacco: Never Used  Substance and Sexual Activity  . Alcohol use: Yes    Comment: one daily   . Drug use: No  . Sexual activity: Not on file  Lifestyle  . Physical activity    Days per week: Not on file    Minutes per session: Not on file  . Stress: Not on file  Relationships  . Social Herbalist on phone: Not on file    Gets together: Not on file    Attends religious service: Not on file    Active member of club or  organization: Not on file    Attends meetings of clubs or organizations: Not on file    Relationship status: Not on file  Other Topics Concern  . Not on file  Social History Narrative   Daily caffeine      Family History: The patient's family history includes Colon cancer in his cousin; Stroke in his father.  ROS:   Please see the history of present illness.    No complaints.  Does not recognize that his hemoglobin A1c was 6.12 years ago.  He has an upcoming appointment with Dr. Martinique for routine follow-up for blood work will be done.  All other systems reviewed and are negative.  EKGs/Labs/Other Studies Reviewed:    The following studies were reviewed today: No new data  EKG:  EKG normal sinus rhythm, right bundle branch block, left anterior hemiblock.  Compared to 11/15/2017, no change is noted with the exception that PVCs have resolved.  Recent Labs: 07/15/2018: BUN 21; Creatinine, Ser 0.74;  Potassium 4.3; Sodium 142  Recent Lipid Panel    Component Value Date/Time   CHOL 138 09/11/2017 0753   TRIG 106.0 09/11/2017 0753   HDL 56.90 09/11/2017 0753   CHOLHDL 2 09/11/2017 0753   VLDL 21.2 09/11/2017 0753   LDLCALC 60 09/11/2017 0753    Physical Exam:    VS:  BP (!) 142/68   Pulse 71   Ht 5\' 10"  (1.778 m)   Wt 189 lb 6.4 oz (85.9 kg)   SpO2 96%   BMI 27.18 kg/m     Wt Readings from Last 3 Encounters:  11/27/18 189 lb 6.4 oz (85.9 kg)  07/15/18 190 lb (86.2 kg)  03/06/18 198 lb 2 oz (89.9 kg)     GEN: Consistent with age. No acute distress HEENT: Normal NECK: No JVD. LYMPHATICS: No lymphadenopathy CARDIAC:  RRR without murmur, gallop, or edema. VASCULAR:  Normal Pulses. No bruits. RESPIRATORY:  Clear to auscultation without rales, wheezing or rhonchi  ABDOMEN: Soft, non-tender, non-distended, No pulsatile mass, MUSCULOSKELETAL: No deformity  SKIN: Warm and dry NEUROLOGIC:  Alert and oriented x 3 PSYCHIATRIC:  Normal affect   ASSESSMENT:    1. 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 Hyperlipidemia LDL goal <70   3. Essential hypertension   4. Erectile dysfunction due to arterial insufficiency   5. Educated about COVID-19 virus infection   6. Block, bifascicular    PLAN:    In order of problems listed above:  1. Doing well since 1999 with no recurrence of symptoms.  He had 5 vessel coronary bypass by Dr. Cyndia Bent.   Secondary prevention discussed in detail. 2. He has upcoming blood work with Dr. Martinique. 3. Target 130/80 mmHg. 4. Not discussed 5. 3W's discussed in detail and accepted and practice as preventive measures for COVID-19. 6. No change compared to prior EKG which also showed a right bundle branch block with left anterior hemiblock.  Overall education and awareness concerning primary/secondary risk prevention was discussed in detail: LDL less than 70, hemoglobin A1c less than 7, blood pressure target less than 130/80 mmHg, >150  minutes of moderate aerobic activity per week, avoidance of smoking, weight control (via diet and exercise), and continued surveillance/management of/for obstructive sleep apnea.    Medication Adjustments/Labs and Tests Ordered: Current medicines are reviewed at length with the patient today.  Concerns regarding medicines are outlined above.  Orders Placed This Encounter  Procedures  . EKG 12-Lead   No orders of the defined types  were placed in this encounter.   Patient Instructions  Medication Instructions:  Your physician recommends that you continue on your current medications as directed. Please refer to the Current Medication list given to you today.  *If you need a refill on your cardiac medications before your next appointment, please call your pharmacy*  Lab Work: None If you have labs (blood work) drawn today and your tests are completely normal, you will receive your results only by: Marland Kitchen MyChart Message (if you have MyChart) OR . A paper copy in the mail If you have any lab test that is abnormal or we need to change your treatment, we will call you to review the results.  Testing/Procedures: None  Follow-Up: At [facility], you and your health needs are our priority.  As part of our continuing mission to provide you with exceptional heart care, we have created designated Provider Care Teams.  These Care Teams include your primary Cardiologist (physician) and Advanced Practice Providers (APPs -  Physician Assistants and Nurse Practitioners) who all work together to provide you with the care you need, when you need it.  Your next appointment:   12 month(s)  The format for your next appointment:   In Person  Provider:   You may see Sinclair Grooms, MD or one of the following Advanced Practice Providers on your designated Care Team:    Truitt Merle, NP  Cecilie Kicks, NP  Kathyrn Drown, NP   Other Instructions  The following are where we would like to see  some of your labs and vitals:  Hemoglobin A1C less than 7 LDL (bad) cholesterol less than 70 Blood pressure 130/80 or lower     Signed, Sinclair Grooms, MD  11/27/2018 9:06 AM    [place]

## 2018-11-27 ENCOUNTER — Encounter: Payer: Self-pay | Admitting: Interventional Cardiology

## 2018-11-27 ENCOUNTER — Ambulatory Visit: Payer: Medicare Other | Admitting: Interventional Cardiology

## 2018-11-27 ENCOUNTER — Other Ambulatory Visit: Payer: Self-pay

## 2018-11-27 VITALS — BP 142/68 | HR 71 | Ht 70.0 in | Wt 189.4 lb

## 2018-11-27 DIAGNOSIS — Z7189 Other specified counseling: Secondary | ICD-10-CM

## 2018-11-27 DIAGNOSIS — I25810 Atherosclerosis of coronary artery bypass graft(s) without angina pectoris: Secondary | ICD-10-CM

## 2018-11-27 DIAGNOSIS — E785 Hyperlipidemia, unspecified: Secondary | ICD-10-CM | POA: Diagnosis not present

## 2018-11-27 DIAGNOSIS — N5201 Erectile dysfunction due to arterial insufficiency: Secondary | ICD-10-CM

## 2018-11-27 DIAGNOSIS — I10 Essential (primary) hypertension: Secondary | ICD-10-CM | POA: Diagnosis not present

## 2018-11-27 DIAGNOSIS — I452 Bifascicular block: Secondary | ICD-10-CM

## 2018-11-27 NOTE — Patient Instructions (Signed)
Medication Instructions:  Your physician recommends that you continue on your current medications as directed. Please refer to the Current Medication list given to you today.  *If you need a refill on your cardiac medications before your next appointment, please call your pharmacy*  Lab Work: None If you have labs (blood work) drawn today and your tests are completely normal, you will receive your results only by: Marland Kitchen MyChart Message (if you have MyChart) OR . A paper copy in the mail If you have any lab test that is abnormal or we need to change your treatment, we will call you to review the results.  Testing/Procedures: None  Follow-Up: At [facility], you and your health needs are our priority.  As part of our continuing mission to provide you with exceptional heart care, we have created designated Provider Care Teams.  These Care Teams include your primary Cardiologist (physician) and Advanced Practice Providers (APPs -  Physician Assistants and Nurse Practitioners) who all work together to provide you with the care you need, when you need it.  Your next appointment:   12 month(s)  The format for your next appointment:   In Person  Provider:   You may see Sinclair Grooms, MD or one of the following Advanced Practice Providers on your designated Care Team:    Truitt Merle, NP  Cecilie Kicks, NP  Kathyrn Drown, NP   Other Instructions  The following are where we would like to see some of your labs and vitals:  Hemoglobin A1C less than 7 LDL (bad) cholesterol less than 70 Blood pressure 130/80 or lower

## 2018-12-02 ENCOUNTER — Encounter: Payer: Self-pay | Admitting: Family Medicine

## 2018-12-20 ENCOUNTER — Other Ambulatory Visit: Payer: Self-pay | Admitting: Family Medicine

## 2018-12-20 DIAGNOSIS — I10 Essential (primary) hypertension: Secondary | ICD-10-CM

## 2018-12-23 DIAGNOSIS — H52203 Unspecified astigmatism, bilateral: Secondary | ICD-10-CM | POA: Diagnosis not present

## 2018-12-23 DIAGNOSIS — H25813 Combined forms of age-related cataract, bilateral: Secondary | ICD-10-CM | POA: Diagnosis not present

## 2018-12-23 DIAGNOSIS — H35373 Puckering of macula, bilateral: Secondary | ICD-10-CM | POA: Diagnosis not present

## 2018-12-23 DIAGNOSIS — H5203 Hypermetropia, bilateral: Secondary | ICD-10-CM | POA: Diagnosis not present

## 2019-01-06 ENCOUNTER — Other Ambulatory Visit: Payer: Self-pay

## 2019-01-06 ENCOUNTER — Other Ambulatory Visit: Payer: Self-pay | Admitting: Family Medicine

## 2019-01-06 DIAGNOSIS — R739 Hyperglycemia, unspecified: Secondary | ICD-10-CM

## 2019-01-06 DIAGNOSIS — I10 Essential (primary) hypertension: Secondary | ICD-10-CM

## 2019-01-06 DIAGNOSIS — B001 Herpesviral vesicular dermatitis: Secondary | ICD-10-CM

## 2019-01-06 DIAGNOSIS — E7800 Pure hypercholesterolemia, unspecified: Secondary | ICD-10-CM

## 2019-01-07 ENCOUNTER — Other Ambulatory Visit (INDEPENDENT_AMBULATORY_CARE_PROVIDER_SITE_OTHER): Payer: Medicare Other

## 2019-01-07 ENCOUNTER — Encounter: Payer: Self-pay | Admitting: Family Medicine

## 2019-01-07 ENCOUNTER — Ambulatory Visit (INDEPENDENT_AMBULATORY_CARE_PROVIDER_SITE_OTHER): Payer: Medicare Other | Admitting: Family Medicine

## 2019-01-07 VITALS — BP 118/70 | HR 73 | Temp 96.1°F | Resp 16 | Ht 70.0 in | Wt 190.5 lb

## 2019-01-07 DIAGNOSIS — R739 Hyperglycemia, unspecified: Secondary | ICD-10-CM | POA: Diagnosis not present

## 2019-01-07 DIAGNOSIS — R7303 Prediabetes: Secondary | ICD-10-CM

## 2019-01-07 DIAGNOSIS — Z0000 Encounter for general adult medical examination without abnormal findings: Secondary | ICD-10-CM | POA: Diagnosis not present

## 2019-01-07 DIAGNOSIS — E7800 Pure hypercholesterolemia, unspecified: Secondary | ICD-10-CM

## 2019-01-07 DIAGNOSIS — I10 Essential (primary) hypertension: Secondary | ICD-10-CM | POA: Diagnosis not present

## 2019-01-07 LAB — COMPREHENSIVE METABOLIC PANEL
ALT: 21 U/L (ref 0–53)
AST: 19 U/L (ref 0–37)
Albumin: 4.4 g/dL (ref 3.5–5.2)
Alkaline Phosphatase: 68 U/L (ref 39–117)
BUN: 26 mg/dL — ABNORMAL HIGH (ref 6–23)
CO2: 27 mEq/L (ref 19–32)
Calcium: 9.5 mg/dL (ref 8.4–10.5)
Chloride: 105 mEq/L (ref 96–112)
Creatinine, Ser: 0.91 mg/dL (ref 0.40–1.50)
GFR: 80.59 mL/min (ref >60.00)
Glucose, Bld: 98 mg/dL (ref 70–99)
Potassium: 4.5 mEq/L (ref 3.5–5.1)
Sodium: 140 mEq/L (ref 135–145)
Total Bilirubin: 0.7 mg/dL (ref 0.2–1.2)
Total Protein: 6.7 g/dL (ref 6.0–8.3)

## 2019-01-07 LAB — CBC WITH DIFFERENTIAL/PLATELET
Basophils Absolute: 0 10*3/uL (ref 0.0–0.1)
Basophils Relative: 0.5 % (ref 0.0–3.0)
Eosinophils Absolute: 0.3 10*3/uL (ref 0.0–0.7)
Eosinophils Relative: 4 % (ref 0.0–5.0)
HCT: 45.1 % (ref 39.0–52.0)
Hemoglobin: 15.2 g/dL (ref 13.0–17.0)
Lymphocytes Relative: 25.9 % (ref 12.0–46.0)
Lymphs Abs: 2.1 10*3/uL (ref 0.7–4.0)
MCHC: 33.6 g/dL (ref 30.0–36.0)
MCV: 95.5 fl (ref 78.0–100.0)
Monocytes Absolute: 0.9 10*3/uL (ref 0.1–1.0)
Monocytes Relative: 10.5 % (ref 3.0–12.0)
Neutro Abs: 4.8 10*3/uL (ref 1.4–7.7)
Neutrophils Relative %: 59.1 % (ref 43.0–77.0)
Platelets: 224 10*3/uL (ref 150.0–400.0)
RBC: 4.73 Mil/uL (ref 4.22–5.81)
RDW: 12.6 % (ref 11.5–15.5)
WBC: 8.2 10*3/uL (ref 4.0–10.5)

## 2019-01-07 LAB — LIPID PANEL
Cholesterol: 142 mg/dL (ref 0–200)
HDL: 54.3 mg/dL (ref >39.00)
LDL Cholesterol: 68 mg/dL (ref 0–99)
NonHDL: 88.19
Total CHOL/HDL Ratio: 3
Triglycerides: 102 mg/dL (ref 0.0–149.0)
VLDL: 20.4 mg/dL (ref 0.0–40.0)

## 2019-01-07 LAB — HEMOGLOBIN A1C: Hgb A1c MFr Bld: 6 % (ref 4.6–6.5)

## 2019-01-07 NOTE — Patient Instructions (Addendum)
  William York , Thank you for taking time to come for your Medicare Wellness Visit. I appreciate your ongoing commitment to your health goals. Please review the following plan we discussed and let me know if I can assist you in the future.   These are the goals we discussed: Goals    . Patient Stated     Maintain your current health       Continue monitoring blood pressure and pulse. Be sure you have an appointment with cardiologist. Fall precautions. Stay active.  We will let you know when we start giving COVID 19 vaccine.   This is a list of the screening recommended for you and due dates:  Health Maintenance  Topic Date Due  . Tetanus Vaccine  07/20/2020  . Flu Shot  Completed  . Pneumonia vaccines  Completed   A few tips:  -As we age balance is not as good as it was, so there is a higher risks for falls. Please remove small rugs and furniture that is "in your way" and could increase the risk of falls. Stretching exercises may help with fall prevention: Yoga and Tai Chi are some examples. Low impact exercise is better, so you are not very achy the next day.  -Sun screen and avoidance of direct sun light recommended. Caution with dehydration, if working outdoors be sure to drink enough fluids.  - Some medications are not safe as we age, increases the risk of side effects and can potentially interact with other medication you are also taken;  including some of over the counter medications. Be sure to let me know when you start a new medication even if it is a dietary/vitamin supplement.   -Healthy diet low in red meet/animal fat and sugar + regular physical activity is recommended.

## 2019-01-07 NOTE — Progress Notes (Signed)
HPI:  [name] is a 78 y.o.male here today for his routine physical examination. Last AWV on 09/11/17 and last CPE 09/11/17.  He lives with his wife. Independent ADL's and IADL's. No falls in the past year and denies depression symptoms.  Functional Status Survey: Is the patient deaf or have difficulty hearing?: Yes Does the patient have difficulty seeing, even when wearing glasses/contacts?: No Does the patient have difficulty concentrating, remembering, or making decisions?: No Does the patient have difficulty walking or climbing stairs?: No Does the patient have difficulty dressing or bathing?: No Does the patient have difficulty doing errands alone such as visiting a doctor's office or shopping?: No  Fall Risk  01/07/2019 09/11/2017 09/11/2017 09/11/2017 09/05/2016  Falls in the past year? 0 No No No No  Number falls in past yr: 0 - - - -  Injury with Fall? 0 - - - -  Follow up Education provided - - - -    Providers he sees regularly: Eye care provider: Dr Delman Cheadle. Dermatologist, Dr Ronnald Ramp, annually. Urologist,annually,he does not remember name of new provider. Cardiologist,Dr Tamala Julian.  Depression screen [facility] 2/9 01/07/2019  Decreased Interest 0  Down, Depressed, Hopeless 0  PHQ - 2 Score 0   Mini-Cog - 01/07/19 1748    Normal clock drawing test?  yes    How many words correct?  3       Hearing Screening   125Hz  250Hz  500Hz  1000Hz  2000Hz  3000Hz  4000Hz  6000Hz  8000Hz   Right ear:           Left ear:             Visual Acuity Screening   Right eye Left eye Both eyes  Without correction:     With correction: 20/25 20/25 20/25    Regular exercise 3 or more times per week: He walks the treadmill and outdoors a few times week. Following a healthy diet: Yes.  HgA1C on 09/05/16 was mildly elevated, 6.1. Negative for polydipsia,polyuria, or polyphagia.   Chronic medical problems: HTN,HLD,CAD,hearing loss, and allergic rhinitis among some.  Immunization History    Administered Date(s) Administered  . Fluad Quad(high Dose 65+) 09/12/2018  . Influenza Split 09/18/2012  . Influenza Whole 11/01/2006, 10/09/2008, 09/23/2011  . Influenza, High Dose Seasonal PF 10/07/2013, 09/30/2017  . Influenza-Unspecified 10/04/2014, 10/05/2015, 09/20/2016  . Pneumococcal Conjugate-13 10/07/2013  . Pneumococcal Polysaccharide-23 01/09/1997, 07/01/2004, 08/15/2012  . Td 06/26/2006  . Tdap 07/21/2010  . Zoster 09/23/2012  . Zoster Recombinat (Shingrix) 10/18/2018, 12/20/2018   Last colon cancer screening: 07/2011, no further follow up was recommended. Last prostate ca screening: 07/2018 , 0.8.  -Denies high alcohol intake, tobacco use, or Hx of illicit drug use.  No new concerns today.  HLD on Atorvastatin 20 mg daily.  Lab Results  Component Value Date   CHOL 138 09/11/2017   HDL 56.90 09/11/2017   LDLCALC 60 09/11/2017   TRIG 106.0 09/11/2017   CHOLHDL 2 09/11/2017   HTN: He is on Metoprolol Succinate 50 mg daily ,Losartan 100 mg daily,and Amlodipine 2.5 mg daily.  Lab Results  Component Value Date   CREATININE 0.74 07/15/2018   BUN 21 07/15/2018   NA 142 07/15/2018   K 4.3 07/15/2018   CL 109 07/15/2018   CO2 25 07/15/2018   He is tolerating medications well.  Review of Systems  Constitutional: Negative for activity change, appetite change, fatigue and fever.  HENT: Negative for dental problem, nosebleeds and sore throat.   Eyes:  Negative for redness and visual disturbance.  Respiratory: Negative for cough, shortness of breath and wheezing.   Cardiovascular: Negative for chest pain, palpitations and leg swelling.  Gastrointestinal: Negative for abdominal pain, nausea and vomiting.  Endocrine: Negative for cold intolerance and heat intolerance.  Genitourinary: Negative for decreased urine volume, dysuria, genital sores, hematuria and testicular pain.  Musculoskeletal: Negative for gait problem and myalgias.  Skin: Negative for rash and wound.   Allergic/Immunologic: Positive for environmental allergies.  Neurological: Negative for syncope, weakness and headaches.  Hematological: Negative for adenopathy. Does not bruise/bleed easily.  Psychiatric/Behavioral: Negative for confusion and sleep disturbance. The patient is not nervous/anxious.   All other systems reviewed and are negative.  Current Outpatient Medications on File Prior to Visit  Medication Sig Dispense Refill  . amLODipine (NORVASC) 2.5 MG tablet TAKE 1 TABLET BY MOUTH EVERY DAY 30 tablet 1  . aspirin EC 81 MG tablet Take 1 tablet (81 mg total) by mouth daily. 90 tablet 3  . atorvastatin (LIPITOR) 20 MG tablet TAKE 1 TABLET BY MOUTH  DAILY 90 tablet 3  . metoprolol succinate (TOPROL-XL) 50 MG 24 hr tablet TAKE 1 TABLET BY MOUTH  DAILY WITH OR IMMEDIATELY  FOLLOWING A MEAL 90 tablet 0  . nitroGLYCERIN (NITROSTAT) 0.4 MG SL tablet Place 1 tablet (0.4 mg total) under the tongue every 5 (five) minutes as needed. 25 tablet 0  . valACYclovir (VALTREX) 1000 MG tablet TAKE 2 TABLETS BY MOUTH  UPON ACUTE ONSET AND REPEAT DOSE IN 12 HOURS. 16 tablet 3   No current facility-administered medications on file prior to visit.   Past Medical History:  Diagnosis Date  . Allergy   . CAD (coronary artery disease)   . ED (erectile dysfunction)   . Hyperlipidemia   . Hypertension   . Internal hemorrhoids   . OA (osteoarthritis)     Past Surgical History:  Procedure Laterality Date  . CORONARY ARTERY BYPASS GRAFT  June 1999  . HERNIA REPAIR  May 2006    No Known Allergies  Family History  Problem Relation Age of Onset  . Colon cancer Cousin        Mother side-First cousin  . Stroke Father     Social History   Socioeconomic History  . Marital status: Married    Spouse name: Not on file  . Number of children: Not on file  . Years of education: Not on file  . Highest education level: Not on file  Occupational History  . Not on file  Tobacco Use  . Smoking status:  Never Smoker  . Smokeless tobacco: Never Used  Substance and Sexual Activity  . Alcohol use: Yes    Comment: one daily   . Drug use: No  . Sexual activity: Not on file  Other Topics Concern  . Not on file  Social History Narrative   Daily caffeine    Social Determinants of Health   Financial Resource Strain:   . Difficulty of Paying Living Expenses: Not on file  Food Insecurity:   . Worried About Charity fundraiser in the Last Year: Not on file  . Ran Out of Food in the Last Year: Not on file  Transportation Needs:   . Lack of Transportation (Medical): Not on file  . Lack of Transportation (Non-Medical): Not on file  Physical Activity:   . Days of Exercise per Week: Not on file  . Minutes of Exercise per Session: Not on file  Stress:   .  Feeling of Stress : Not on file  Social Connections:   . Frequency of Communication with Friends and Family: Not on file  . Frequency of Social Gatherings with Friends and Family: Not on file  . Attends Religious Services: Not on file  . Active Member of Clubs or Organizations: Not on file  . Attends Archivist Meetings: Not on file  . Marital Status: Not on file     Vitals:   01/07/19 0712  BP: 118/70  Pulse: 73  Resp: 16  Temp: (!) 96.1 F (35.6 C)  SpO2: 96%   Body mass index is 27.33 kg/m.   Wt Readings from Last 3 Encounters:  01/07/19 190 lb 8 oz (86.4 kg)  11/27/18 189 lb 6.4 oz (85.9 kg)  07/15/18 190 lb (86.2 kg)    Physical Exam  Nursing note and vitals reviewed. Constitutional: He is oriented to person, place, and time. He appears well-developed. No distress.  HENT:  Head: Normocephalic and atraumatic.  Right Ear: Tympanic membrane, external ear and ear canal normal.  Left Ear: Tympanic membrane, external ear and ear canal normal. Decreased hearing is noted.  Mouth/Throat: Oropharynx is clear and moist and mucous membranes are normal.  Eyes: Pupils are equal, round, and reactive to light.  Conjunctivae and EOM are normal.  Neck: No tracheal deviation present.  Cardiovascular: Normal rate and regular rhythm.  Occasional extrasystoles (x 1) are present.  No murmur heard. Pulses:      Dorsalis pedis pulses are 2+ on the right side and 2+ on the left side.  Respiratory: Effort normal and breath sounds normal. No respiratory distress.  GI: Soft. He exhibits no mass. There is no hepatomegaly. There is no abdominal tenderness.  Genitourinary:    Genitourinary Comments: No concerns.   Musculoskeletal:        General: No tenderness or edema.     Cervical back: Normal range of motion.     Comments: No major deformities appreciated and no signs of synovitis.  Lymphadenopathy:    He has no cervical adenopathy.  Neurological: He is alert and oriented to person, place, and time. He has normal strength. No cranial nerve deficit or sensory deficit. Coordination and gait normal.  Reflex Scores:      Bicep reflexes are 2+ on the right side and 2+ on the left side.      Patellar reflexes are 2+ on the right side and 2+ on the left side. Skin: Skin is warm. No erythema.  Psychiatric: He has a normal mood and affect. Cognition and memory are normal.  Well groomed, good eye contact.    ASSESSMENT AND PLAN:  Mr. Joanthony was seen today for annual exam.  Diagnoses and all orders for this visit:  Lab Results  Component Value Date   CHOL 142 01/07/2019   HDL 54.30 01/07/2019   LDLCALC 68 01/07/2019   TRIG 102.0 01/07/2019   CHOLHDL 3 01/07/2019   Lab Results  Component Value Date   ALT 21 01/07/2019   AST 19 01/07/2019   ALKPHOS 68 01/07/2019   BILITOT 0.7 01/07/2019   Lab Results  Component Value Date   CREATININE 0.91 01/07/2019   BUN 26 (H) 01/07/2019   NA 140 01/07/2019   K 4.5 01/07/2019   CL 105 01/07/2019   CO2 27 01/07/2019   Lab Results  Component Value Date   HGBA1C 6.0 01/07/2019   Lab Results  Component Value Date   WBC 8.2 01/07/2019   HGB 15.2  01/07/2019    HCT 45.1 01/07/2019   MCV 95.5 01/07/2019   PLT 224.0 01/07/2019    Routine general medical examination at a health care facility Rgular physical activity and healthy diet for prevention of chronic illness and/or complications. Preventive guidelines reviewed.  Next CPE in a year.  HYPERCHOLESTEROLEMIA No changes in current management, will follow labs done today and will give further recommendations accordingly.  Essential hypertension BP adequately controlled. No changes in current management.  Prediabetes Continue healthy lifestyle for primary prevention.  Medicare annual wellness visit, subsequent We discussed the importance of staying active, physically and mentally, as well as the benefits of a healthy/balance diet. Low impact exercise that involve stretching and strengthing are ideal. Vaccines up to date. We discussed preventive screening for the next 5-10 years, summery of recommendations given in AVS. Fall prevention. Annual influenza vaccine. Advance directives and end of life discussed, he has POA and living will.    He follows with cardiologist,so I think annual follow up is appropriate, before if needed.  Return in 1 year (on 01/07/2020) for cpe and f/u.    William Cotten G. Martinique, MD  [facility]. [place] office.

## 2019-01-08 ENCOUNTER — Other Ambulatory Visit: Payer: Self-pay | Admitting: Family Medicine

## 2019-01-10 ENCOUNTER — Encounter: Payer: Self-pay | Admitting: Family Medicine

## 2019-01-13 DIAGNOSIS — S0502XA Injury of conjunctiva and corneal abrasion without foreign body, left eye, initial encounter: Secondary | ICD-10-CM | POA: Diagnosis not present

## 2019-01-14 ENCOUNTER — Other Ambulatory Visit: Payer: Self-pay | Admitting: Family Medicine

## 2019-01-14 DIAGNOSIS — I10 Essential (primary) hypertension: Secondary | ICD-10-CM

## 2019-01-20 DIAGNOSIS — S0502XD Injury of conjunctiva and corneal abrasion without foreign body, left eye, subsequent encounter: Secondary | ICD-10-CM | POA: Diagnosis not present

## 2019-01-21 ENCOUNTER — Ambulatory Visit: Payer: Medicare Other

## 2019-01-21 VITALS — Temp 98.4°F | Ht 70.0 in | Wt 183.0 lb

## 2019-01-21 DIAGNOSIS — Z0000 Encounter for general adult medical examination without abnormal findings: Secondary | ICD-10-CM

## 2019-01-21 NOTE — Progress Notes (Signed)
This visit is being conducted via phone call due to the COVID-19 pandemic. This patient has given me verbal consent via phone to conduct this visit, patient states they are participating from their home address. Some vital signs may be absent or patient reported.   Patient identification: identified by name, DOB, and current address.  Location provider: [facility], Office Persons participating in the virtual visit: Mr. Iliya Josephson and Franne Forts, LPN.    Subjective:   William York is a 79 y.o. male who presents for Medicare Annual/Subsequent preventive examination.  Mr. Todhunter reports that he is doing quite well. He walks several miles daily with his wife if the weather is cooperative. They previously went to a gym but stopped that due to covid pandemic. He reports that he is eating 3 healthy, balanced meals each day. He also reports he will receive the first dose of covid vaccine on 01/22/19.   Review of Systems:  Cardiac Risk Factors include: dyslipidemia;advanced age (>56men, >53 women);male gender     Objective:    Vitals: Temp 98.4 F (36.9 C)   Ht 5\' 10"  (1.778 m)   Wt 183 lb (83 kg)   BMI 26.26 kg/m   Body mass index is 26.26 kg/m.  Advanced Directives 01/21/2019 09/11/2017 09/05/2016 07/07/2014  Does Patient Have a Medical Advance Directive? Yes Yes Yes No  Type of Paramedic of [place];Living will - - -  Does patient want to make changes to medical advance directive? No - Patient declined - - -  Copy of [place] in Chart? No - copy requested - - -  Would patient like information on creating a medical advance directive? - - - No - patient declined information    Tobacco Social History   Tobacco Use  Smoking Status Never Smoker  Smokeless Tobacco Never Used     Counseling given: Not Answered   Clinical Intake:  Pre-visit preparation completed: Yes  Pain : No/denies pain     BMI - recorded: 26.29 Nutritional  Status: BMI 25 -29 Overweight Nutritional Risks: None Diabetes: No  How often do you need to have someone help you when you read instructions, pamphlets, or other written materials from your doctor or pharmacy?: 1 - Never What is the last grade level you completed in school?: master's degree  Interpreter Needed?: No  Information entered by :: Franne Forts, LPN  Past Medical History:  Diagnosis Date  . Allergy   . CAD (coronary artery disease)   . ED (erectile dysfunction)   . Hyperlipidemia   . Hypertension   . Internal hemorrhoids   . OA (osteoarthritis)    Past Surgical History:  Procedure Laterality Date  . CORONARY ARTERY BYPASS GRAFT  June 1999  . HERNIA REPAIR  May 2006   Family History  Problem Relation Age of Onset  . Colon cancer Cousin        Mother side-First cousin  . Stroke Father    Social History   Socioeconomic History  . Marital status: Married    Spouse name: Not on file  . Number of children: 2  . Years of education: 18 years  . Highest education level: Master's degree (e.g., MA, MS, MEng, MEd, MSW, MBA)  Occupational History  . Occupation: Retired  Tobacco Use  . Smoking status: Never Smoker  . Smokeless tobacco: Never Used  Substance and Sexual Activity  . Alcohol use: Yes    Comment: one daily   . Drug  use: No  . Sexual activity: Not on file  Other Topics Concern  . Not on file  Social History Narrative   Married    [place] 2   1 son + 1 Daughter   5 grandchildren      Social Determinants of Health   Financial Resource Strain: Low Risk   . Difficulty of Paying Living Expenses: Not hard at all  Food Insecurity: No Food Insecurity  . Worried About Charity fundraiser in the Last Year: Never true  . Ran Out of Food in the Last Year: Never true  Transportation Needs:   . Lack of Transportation (Medical): Not on file  . Lack of Transportation (Non-Medical): Not on file  Physical Activity: Sufficiently Active  . Days of Exercise per  Week: 6 days  . Minutes of Exercise per Session: 60 min  Stress: No Stress Concern Present  . Feeling of Stress : Only a little  Social Connections: Not Isolated  . Frequency of Communication with Friends and Family: More than three times a week  . Frequency of Social Gatherings with Friends and Family: Once a week  . Attends Religious Services: More than 4 times per year  . Active Member of Clubs or Organizations: Yes  . Attends Archivist Meetings: More than 4 times per year  . Marital Status: Married    Outpatient Encounter Medications as of 01/21/2019  Medication Sig  . amLODipine (NORVASC) 2.5 MG tablet TAKE 1 TABLET BY MOUTH EVERY DAY  . aspirin EC 81 MG tablet Take 1 tablet (81 mg total) by mouth daily.  Marland Kitchen atorvastatin (LIPITOR) 20 MG tablet TAKE 1 TABLET BY MOUTH  DAILY  . losartan (COZAAR) 100 MG tablet TAKE 1 TABLET BY MOUTH  DAILY  . metoprolol succinate (TOPROL-XL) 50 MG 24 hr tablet TAKE 1 TABLET BY MOUTH  DAILY WITH OR IMMEDIATELY  FOLLOWING A MEAL  . nitroGLYCERIN (NITROSTAT) 0.4 MG SL tablet Place 1 tablet (0.4 mg total) under the tongue every 5 (five) minutes as needed.  . valACYclovir (VALTREX) 1000 MG tablet TAKE 2 TABLETS BY MOUTH  UPON ACUTE ONSET AND REPEAT DOSE IN 12 HOURS.   No facility-administered encounter medications on file as of 01/21/2019.    Activities of Daily Living In your present state of health, do you have any difficulty performing the following activities: 01/21/2019 01/07/2019  Hearing? N Y  Vision? N N  Difficulty concentrating or making decisions? N N  Walking or climbing stairs? N N  Dressing or bathing? N N  Doing errands, shopping? N N  Preparing Food and eating ? N -  Using the Toilet? N -  In the past six months, have you accidently leaked urine? N -  Do you have problems with loss of bowel control? N -  Managing your Medications? N -  Managing your Finances? N -  Housekeeping or managing your Housekeeping? N -  Some  recent data might be hidden    Patient Care Team: Martinique, [name], MD as PCP - General (Family Medicine) Belva Crome, MD as PCP - Cardiology (Cardiology) Belva Crome, MD as Consulting Physician (Cardiology) Kathie Rhodes, MD as Consulting Physician (Urology) Melissa Noon, [place] as Referring Physician (Optometry)   Assessment:   This is a routine wellness examination for Hernan.  Exercise Activities and Dietary recommendations Current Exercise Habits: Home exercise routine, Type of exercise: walking, Time (Minutes): > 60, Frequency (Times/Week): 6, Weekly Exercise (Minutes/Week): 0, Intensity: Moderate, Exercise limited  by: None identified  Goals    . Patient Stated     Maintain your current health and avoid covid       Fall Risk Fall Risk  01/21/2019 01/07/2019 09/11/2017 09/11/2017 09/11/2017  Falls in the past year? 0 0 No No No  Number falls in past yr: - 0 - - -  Injury with Fall? - 0 - - -  Follow up - Education provided - - -   Is the patient's home free of loose throw rugs in walkways, pet beds, electrical cords, etc?   yes      Grab bars in the bathroom? yes      Handrails on the stairs?   yes      Adequate lighting?   yes  Timed Get Up and Go Performed: N/A due to telephone visit.  Depression Screen PHQ 2/9 Scores 01/21/2019 01/07/2019 09/11/2017 09/11/2017  PHQ - 2 Score 0 0 0 0    Cognitive Function MMSE - Mini Mental State Exam 09/11/2017  Not completed: (No Data)     6CIT Screen 01/21/2019  What Year? 0 points  What month? 0 points  Count back from 20 0 points  Months in reverse 0 points  Repeat phrase 0 points    Immunization History  Administered Date(s) Administered  . Fluad Quad(high Dose 65+) 09/12/2018  . Influenza Split 09/18/2012  . Influenza Whole 11/01/2006, 10/09/2008, 09/23/2011  . Influenza, High Dose Seasonal PF 10/07/2013, 09/30/2017  . Influenza-Unspecified 10/04/2014, 10/05/2015, 09/20/2016  . Pneumococcal Conjugate-13 10/07/2013  .  Pneumococcal Polysaccharide-23 01/09/1997, 07/01/2004, 08/15/2012  . Td 06/26/2006  . Tdap 07/21/2010  . Zoster 09/23/2012  . Zoster Recombinat (Shingrix) 10/18/2018, 12/20/2018    Qualifies for Shingles Vaccine? Yes and has completed shingrix vaccine series.  Screening Tests Health Maintenance  Topic Date Due  . TETANUS/TDAP  07/20/2020  . INFLUENZA VACCINE  Completed  . PNA vac Low Risk Adult  Completed   Cancer Screenings: Lung: Low Dose CT Chest recommended if Age 53-80 years, 30 pack-year currently smoking OR have quit w/in 15years. Patient does not qualify. Colorectal: yes; no longer necessary due to age.   Additional Screenings:  Hepatitis C Screening:N/A due to age.       Plan:   Mr. Oriley is currently up to date with all preventive health screenings and immunizations. He plans to take covid-19 vaccines as well.  I have personally reviewed and noted the following in the patient's chart:   . Medical and social history . Use of alcohol, tobacco or illicit drugs  . Current medications and supplements . Functional ability and status . Nutritional status . Physical activity . Advanced directives . List of other physicians . Hospitalizations, surgeries, and ER visits in previous 12 months . Vitals . Screenings to include cognitive, depression, and falls . Referrals and appointments  In addition, I have reviewed and discussed with patient certain preventive protocols, quality metrics, and best practice recommendations. A written personalized care plan for preventive services as well as general preventive health recommendations were provided to patient.     Franne Forts, LPN  [phone]

## 2019-01-21 NOTE — Patient Instructions (Signed)
[name] , Thank you for taking time to participate in your Medicare Wellness Visit. I appreciate your ongoing commitment to your health goals. Please review the following plan we discussed and let me know if I can assist you in the future.   Screening recommendations/referrals: Colorectal Screening: last colonoscopy done on 08/09/2014; no longer necessary.  Vision and Dental Exams: Recommended annual ophthalmology exams for early detection of glaucoma and other disorders of the eye. Patient states this was done in Dec 2020. Recommended annual dental exams for proper oral hygiene. Patient states this was done just a few weeks ago.  Diabetic Exams: Diabetic Eye Exam: N/A Diabetic Foot Exam: N/A  Vaccinations: Influenza vaccine: completed 09/12/2018; due again in Fall 2021. Pneumococcal vaccine: completed 08/15/2012 & 10/07/2013. Up to date. Tdap vaccine: Completed 07/21/2010. Due again 07/20/2020. Shingles vaccine: completed 10/18/2018 & 12/20/2018  Advanced directives: Advance directives discussed with you today.Please bring a copy of your POA (Power of [place]) and/or Living Will to your next appointment.  Goals: Recommend to drink at least 6-8 8oz glasses of water per day. Continue to exercise for at least 150 minutes per week. Recommend to remove any items from the home that may cause slips or trips.  Next appointment: Please schedule your Annual Wellness Visit with your Nurse Health Advisor in one year.  Preventive Care 65 Years and Older, Male Preventive care refers to lifestyle choices and visits with your health care provider that can promote health and wellness. What does preventive care include?  A yearly physical exam. This is also called an annual well check.  Dental exams once or twice a year.  Routine eye exams. Ask your health care provider how often you should have your eyes checked.  Personal lifestyle choices, including:  Daily care of your teeth and  gums.  Regular physical activity.  Eating a healthy diet.  Avoiding tobacco and drug use.  Limiting alcohol use.  Practicing safe sex.  Taking low doses of aspirin every day if recommended by your health care provider..  Taking vitamin and mineral supplements as recommended by your health care provider. What happens during an annual well check? The services and screenings done by your health care provider during your annual well check will depend on your age, overall health, lifestyle risk factors, and family history of disease. Counseling  Your health care provider may ask you questions about your:  Alcohol use.  Tobacco use.  Drug use.  Emotional well-being.  Home and relationship well-being.  Sexual activity.  Eating habits.  History of falls.  Memory and ability to understand (cognition).  Work and work Statistician. Screening  You may have the following tests or measurements:  Height, weight, and BMI.  Blood pressure.  Lipid and cholesterol levels. These may be checked every 5 years, or more frequently if you are over 7 years old.  Skin check.  Lung cancer screening. You may have this screening every year starting at age 26 if you have a 30-pack-year history of smoking and currently smoke or have quit within the past 15 years.  Fecal occult blood test (FOBT) of the stool. You may have this test every year starting at age 63.  Flexible sigmoidoscopy or colonoscopy. You may have a sigmoidoscopy every 5 years or a colonoscopy every 10 years starting at age 51.  Prostate cancer screening. Recommendations will vary depending on your family history and other risks.  Hepatitis C blood test.  Hepatitis B blood test.  Sexually transmitted disease (STD)  testing.  Diabetes screening. This is done by checking your blood sugar (glucose) after you have not eaten for a while (fasting). You may have this done every 1-3 years.  Abdominal aortic aneurysm (AAA)  screening. You may need this if you are a current or former smoker.  Osteoporosis. You may be screened starting at age 25 if you are at high risk. Talk with your health care provider about your test results, treatment options, and if necessary, the need for more tests. Vaccines  Your health care provider may recommend certain vaccines, such as:  Influenza vaccine. This is recommended every year.  Tetanus, diphtheria, and acellular pertussis (Tdap, Td) vaccine. You may need a Td booster every 10 years.  Zoster vaccine. You may need this after age 36.  Pneumococcal 13-valent conjugate (PCV13) vaccine. One dose is recommended after age 11.  Pneumococcal polysaccharide (PPSV23) vaccine. One dose is recommended after age 59. Talk to your health care provider about which screenings and vaccines you need and how often you need them. This information is not intended to replace advice given to you by your health care provider. Make sure you discuss any questions you have with your health care provider. Document Released: 01/22/2015 Document Revised: 09/15/2015 Document Reviewed: 10/27/2014 Elsevier Interactive Patient Education  2017 [place] Prevention in the Home Falls can cause injuries. They can happen to people of all ages. There are many things you can do to make your home safe and to help prevent falls. What can I do on the outside of my home?  Regularly fix the edges of walkways and driveways and fix any cracks.  Remove anything that might make you trip as you walk through a door, such as a raised step or threshold.  Trim any bushes or trees on the path to your home.  Use bright outdoor lighting.  Clear any walking paths of anything that might make someone trip, such as rocks or tools.  Regularly check to see if handrails are loose or broken. Make sure that both sides of any steps have handrails.  Any raised decks and porches should have guardrails on the  edges.  Have any leaves, snow, or ice cleared regularly.  Use sand or salt on walking paths during winter.  Clean up any spills in your garage right away. This includes oil or grease spills. What can I do in the bathroom?  Use night lights.  Install grab bars by the toilet and in the tub and shower. Do not use towel bars as grab bars.  Use non-skid mats or decals in the tub or shower.  If you need to sit down in the shower, use a plastic, non-slip stool.  Keep the floor dry. Clean up any water that spills on the floor as soon as it happens.  Remove soap buildup in the tub or shower regularly.  Attach bath mats securely with double-sided non-slip rug tape.  Do not have throw rugs and other things on the floor that can make you trip. What can I do in the bedroom?  Use night lights.  Make sure that you have a light by your bed that is easy to reach.  Do not use any sheets or blankets that are too big for your bed. They should not hang down onto the floor.  Have a firm chair that has side arms. You can use this for support while you get dressed.  Do not have throw rugs and other things on the floor  that can make you trip. What can I do in the kitchen?  Clean up any spills right away.  Avoid walking on wet floors.  Keep items that you use a lot in easy-to-reach places.  If you need to reach something above you, use a strong step stool that has a grab bar.  Keep electrical cords out of the way.  Do not use floor polish or wax that makes floors slippery. If you must use wax, use non-skid floor wax.  Do not have throw rugs and other things on the floor that can make you trip. What can I do with my stairs?  Do not leave any items on the stairs.  Make sure that there are handrails on both sides of the stairs and use them. Fix handrails that are broken or loose. Make sure that handrails are as long as the stairways.  Check any carpeting to make sure that it is firmly  attached to the stairs. Fix any carpet that is loose or worn.  Avoid having throw rugs at the top or bottom of the stairs. If you do have throw rugs, attach them to the floor with carpet tape.  Make sure that you have a light switch at the top of the stairs and the bottom of the stairs. If you do not have them, ask someone to add them for you. What else can I do to help prevent falls?  Wear shoes that:  Do not have high heels.  Have rubber bottoms.  Are comfortable and fit you well.  Are closed at the toe. Do not wear sandals.  If you use a stepladder:  Make sure that it is fully opened. Do not climb a closed stepladder.  Make sure that both sides of the stepladder are locked into place.  Ask someone to hold it for you, if possible.  Clearly mark and make sure that you can see:  Any grab bars or handrails.  First and last steps.  Where the edge of each step is.  Use tools that help you move around (mobility aids) if they are needed. These include:  Canes.  Walkers.  Scooters.  Crutches.  Turn on the lights when you go into a dark area. Replace any light bulbs as soon as they burn out.  Set up your furniture so you have a clear path. Avoid moving your furniture around.  If any of your floors are uneven, fix them.  If there are any pets around you, be aware of where they are.  Review your medicines with your doctor. Some medicines can make you feel dizzy. This can increase your chance of falling. Ask your doctor what other things that you can do to help prevent falls. This information is not intended to replace advice given to you by your health care provider. Make sure you discuss any questions you have with your health care provider. Document Released: 10/22/2008 Document Revised: 06/03/2015 Document Reviewed: 01/30/2014 Elsevier Interactive Patient Education  2017 [employer].

## 2019-01-23 ENCOUNTER — Ambulatory Visit: Payer: Medicare Other | Attending: Internal Medicine

## 2019-01-23 DIAGNOSIS — Z23 Encounter for immunization: Secondary | ICD-10-CM

## 2019-01-23 NOTE — Progress Notes (Signed)
   Covid-19 Vaccination Clinic  Name:  William York    MRN: [id number] DOB: [birth year]  01/23/2019  [name] was observed post Covid-19 immunization for 15 minutes without incidence. He was provided with Vaccine Information Sheet and instruction to access the V-Safe system.   [name] was instructed to call 911 with any severe reactions post vaccine: Marland Kitchen Difficulty breathing  . Swelling of your face and throat  . A fast heartbeat  . A bad rash all over your body  . Dizziness and weakness    Immunizations Administered    Name Date Dose VIS Date Route   Pfizer COVID-19 Vaccine 01/23/2019  8:46 AM 0.3 mL 12/20/2018 Intramuscular   Manufacturer: [place]   Lot: [id number]   [place]: [id number]

## 2019-02-11 ENCOUNTER — Ambulatory Visit: Payer: Medicare Other | Attending: Internal Medicine

## 2019-02-11 DIAGNOSIS — Z23 Encounter for immunization: Secondary | ICD-10-CM

## 2019-02-11 NOTE — Progress Notes (Signed)
   Covid-19 Vaccination Clinic  Name:  XHAIDEN OHANLON    MRN: [id number] DOB: 07-26-1940  02/11/2019  Mr. Bonam was observed post Covid-19 immunization for 15 minutes without incidence. He was provided with Vaccine Information Sheet and instruction to access the V-Safe system.   Mr. Pierman was instructed to call 911 with any severe reactions post vaccine: Marland Kitchen Difficulty breathing  . Swelling of your face and throat  . A fast heartbeat  . A bad rash all over your body  . Dizziness and weakness    Immunizations Administered    Name Date Dose VIS Date Route   Pfizer COVID-19 Vaccine 02/11/2019  9:07 AM 0.3 mL 12/20/2018 Intramuscular   Manufacturer: [place]   Lot: [id number]   [place]: [id number]

## 2019-02-24 ENCOUNTER — Other Ambulatory Visit: Payer: Self-pay | Admitting: Family Medicine

## 2019-02-24 DIAGNOSIS — I10 Essential (primary) hypertension: Secondary | ICD-10-CM

## 2019-03-13 ENCOUNTER — Other Ambulatory Visit: Payer: Self-pay | Admitting: Family Medicine

## 2019-03-13 DIAGNOSIS — I10 Essential (primary) hypertension: Secondary | ICD-10-CM

## 2019-03-21 ENCOUNTER — Encounter: Payer: Self-pay | Admitting: Family Medicine

## 2019-03-21 DIAGNOSIS — I10 Essential (primary) hypertension: Secondary | ICD-10-CM

## 2019-03-21 MED ORDER — AMLODIPINE BESYLATE 2.5 MG PO TABS: 2.5000 mg | ORAL_TABLET | Freq: Every day | ORAL | 3 refills | Status: DC

## 2019-05-21 DIAGNOSIS — Z85828 Personal history of other malignant neoplasm of skin: Secondary | ICD-10-CM | POA: Diagnosis not present

## 2019-05-21 DIAGNOSIS — L821 Other seborrheic keratosis: Secondary | ICD-10-CM | POA: Diagnosis not present

## 2019-05-21 DIAGNOSIS — L812 Freckles: Secondary | ICD-10-CM | POA: Diagnosis not present

## 2019-05-21 DIAGNOSIS — L738 Other specified follicular disorders: Secondary | ICD-10-CM | POA: Diagnosis not present

## 2019-05-21 DIAGNOSIS — L570 Actinic keratosis: Secondary | ICD-10-CM | POA: Diagnosis not present

## 2019-06-22 ENCOUNTER — Other Ambulatory Visit: Payer: Self-pay | Admitting: Family Medicine

## 2019-06-22 DIAGNOSIS — E7800 Pure hypercholesterolemia, unspecified: Secondary | ICD-10-CM

## 2019-08-27 ENCOUNTER — Encounter: Payer: Self-pay | Admitting: Family Medicine

## 2019-10-21 DIAGNOSIS — M7130 Other bursal cyst, unspecified site: Secondary | ICD-10-CM | POA: Diagnosis not present

## 2019-10-21 DIAGNOSIS — L218 Other seborrheic dermatitis: Secondary | ICD-10-CM | POA: Diagnosis not present

## 2019-10-21 DIAGNOSIS — L821 Other seborrheic keratosis: Secondary | ICD-10-CM | POA: Diagnosis not present

## 2019-10-21 DIAGNOSIS — L570 Actinic keratosis: Secondary | ICD-10-CM | POA: Diagnosis not present

## 2019-10-21 DIAGNOSIS — Z85828 Personal history of other malignant neoplasm of skin: Secondary | ICD-10-CM | POA: Diagnosis not present

## 2019-10-27 DIAGNOSIS — T1501XA Foreign body in cornea, right eye, initial encounter: Secondary | ICD-10-CM | POA: Diagnosis not present

## 2019-11-20 ENCOUNTER — Other Ambulatory Visit: Payer: Self-pay | Admitting: Family Medicine

## 2019-11-23 NOTE — Progress Notes (Signed)
Cardiology Office Note   Date:  11/27/2019   ID:  [name], DOB [birth year], MRN [id number]  PCP:  [place], [name], MD  Cardiologist:  Dr. Tamala Julian, MD   Chief Complaint  Patient presents with   Follow-up    History of Present Illness: William York is a 79 y.o. male who presents for 1 year follow-up, seen for Dr. Tamala Julian.  [name] has a history of HLD, CAD s/p CABG times 05/27/1997, HTN, ED, PVCs, and osteoarthritis.  He was most recently seen by Dr. Tamala Julian 11/27/2018 for annual follow-up at which time he had no anginal symptoms or other specific complaints. He was walking 4 times per week.  He underwent stress testing 11/20/2017 which was low risk with frequent PVCs therefore he wore a Holter monitor which did not reveal any malignant ventricular arrhythmias with a PVC burden of less than 3%. Echocardiogram at that time with normal LVEF at 55 to 60% and no valvular disease.  Today he presents for follow up and reports he has been doing very well from a CV standpoint. He continues to walk for exercise without chest pain. Denies SOB, LE edema, palpitations, PND, orthopnea or syncope. He has not had to use SL NTG. Plans to go visit son and family in [place] for Thanksgiving.   Past Medical History:  Diagnosis Date   Allergy    CAD (coronary artery disease)    ED (erectile dysfunction)    Hyperlipidemia    Hypertension    Internal hemorrhoids    OA (osteoarthritis)     Past Surgical History:  Procedure Laterality Date   CORONARY ARTERY BYPASS GRAFT  June 1999   HERNIA REPAIR  May 2006     Current Outpatient Medications  Medication Sig Dispense Refill   amLODipine (NORVASC) 2.5 MG tablet Take 1 tablet (2.5 mg total) by mouth daily. 90 tablet 3   aspirin EC 81 MG tablet Take 1 tablet (81 mg total) by mouth daily. 90 tablet 3   atorvastatin (LIPITOR) 20 MG tablet TAKE 1 TABLET BY MOUTH  DAILY 90 tablet 3   losartan (COZAAR) 100 MG tablet TAKE 1 TABLET BY  MOUTH  DAILY 90 tablet 3   metoprolol succinate (TOPROL-XL) 50 MG 24 hr tablet TAKE 1 TABLET BY MOUTH  DAILY WITH OR IMMEDIATELY  FOLLOWING A MEAL 90 tablet 3   nitroGLYCERIN (NITROSTAT) 0.4 MG SL tablet Place 1 tablet (0.4 mg total) under the tongue every 5 (five) minutes as needed. 25 tablet 0   valACYclovir (VALTREX) 1000 MG tablet TAKE 2 TABLETS BY MOUTH  UPON ACUTE ONSET AND REPEAT DOSE IN 12 HOURS. 16 tablet 3   No current facility-administered medications for this visit.    Allergies:   Patient has no known allergies.    Social History:  The patient  reports that he has never smoked. He has never used smokeless tobacco. He reports current alcohol use. He reports that he does not use drugs.   Family History:  The patient's family history includes Colon cancer in his cousin; Stroke in his father.    ROS:  Please see the history of present illness. Otherwise, review of systems are positive for none.   All other systems are reviewed and negative.    PHYSICAL EXAM: VS:  BP 120/68    Pulse 69    Ht 5\' 10"  (1.778 m)    Wt 189 lb (85.7 kg)    SpO2 96%    BMI  27.12 kg/m  , BMI Body mass index is 27.12 kg/m.   General: Well developed, well nourished, NAD Neck: Negative for carotid bruits. No JVD Lungs:Clear to ausculation bilaterally. No wheezes, rales, or rhonchi. Breathing is unlabored. Cardiovascular: RRR with S1 S2. No murmurs, rubs, gallops, or LV heave appreciated. Neuro: Alert and oriented. No focal deficits. No facial asymmetry. MAE spontaneously. Psych: Responds to questions appropriately with normal affect.     EKG:  EKG is ordered today. The ekg ordered today demonstrates NSR with IVCD, RBBB, HR 69   Recent Labs: 01/07/2019: ALT 21; BUN 26; Creatinine, Ser 0.91; Hemoglobin 15.2; Platelets 224.0; Potassium 4.5; Sodium 140    Lipid Panel    Component Value Date/Time   CHOL 142 01/07/2019 0716   TRIG 102.0 01/07/2019 0716   HDL 54.30 01/07/2019 0716   CHOLHDL 3  01/07/2019 0716   VLDL 20.4 01/07/2019 0716   LDLCALC 68 01/07/2019 0716     Wt Readings from Last 3 Encounters:  11/27/19 189 lb (85.7 kg)  01/21/19 183 lb (83 kg)  01/07/19 190 lb 8 oz (86.4 kg)    Other studies Reviewed: Additional studies/ records that were reviewed today include: Review of the above records demonstrates:   Holter or event monitor 24-hour 11/21/2017:   Basic underlying rhythm is normal sinus.  Rare PACs with one 4 beat run  Rare PVCs with less than 3% burden.  Occasional ventricular bigeminy.  No complaints to correlate with dysrhythmia noted.  No atrial fibrillation or pauses greater than 3 seconds.   Minimum HR: 50 BPM at 1:02:54 AM Maximum HR: 94 BPM at 10:41:35 AM Average HR: 68 BPM   Echocardiogram 11/21/2017:   - Left ventricle: The cavity size was normal. There was mild  concentric hypertrophy. Systolic function was normal. The  estimated ejection fraction was in the range of 55% to 60%. Wall  motion was normal; there were no regional wall motion  abnormalities. Left ventricular diastolic function parameters  were normal.  - Aortic valve: Trileaflet; mildly thickened, mildly calcified  leaflets. There was trivial regurgitation.  - Aorta: Fibrocalcific change was present in the root.  - Mitral valve: Mildly thickened leaflets . There was trivial  regurgitation.  - Left atrium: The atrium was moderately dilated.  - Right ventricle: Systolic function was low normal.  - Atrial septum: No defect or patent foramen ovale was identified.  - Tricuspid valve: There was trivial regurgitation.  - Pulmonic valve: There was mild regurgitation.   Stress test 11/20/2017:   The left ventricular ejection fraction is normal (55-65%).  Nuclear stress EF: 55%.  Blood pressure demonstrated a normal response to exercise.  There was no ST segment deviation noted during stress.  The study is normal.  This is a low risk study.     Normal resting and stress perfusion. No ischemia or infarction EF 55% frequent PVC with stress and in recovery    ASSESSMENT AND PLAN:  1. CAD s/p CABG x5 in 1999: -Denies anginal symptoms -Continue current regimen with Toprol, losartan, statin and ASA -BMET   2. HLD: -Last LDL, 68 on 01/07/2019 -Continue statin  -Lipid panel today   3. HTN: -Stable, 120/68 >>at goal of less than 120/37mmHg -Continue Toprol, losartan, amlodipine    Current medicines are reviewed at length with the patient today.  The patient does not have concerns regarding medicines.  The following changes have been made:  no change  Labs/ tests ordered today include: BMET, Lipid panel  Orders Placed This Encounter  Procedures   Lipid panel   Basic metabolic panel   EKG 58-VEXO    Disposition:   FU with Dr. Tamala Julian in 1 year  Signed, Kathyrn Drown, NP  11/27/2019 8:37 AM    [address], [place], [place]  [address] Phone: [phone]; Fax: [phone]

## 2019-11-27 ENCOUNTER — Other Ambulatory Visit: Payer: Self-pay

## 2019-11-27 ENCOUNTER — Ambulatory Visit: Payer: Medicare Other | Admitting: Cardiology

## 2019-11-27 ENCOUNTER — Encounter: Payer: Self-pay | Admitting: Cardiology

## 2019-11-27 VITALS — BP 120/68 | HR 69 | Ht 70.0 in | Wt 189.0 lb

## 2019-11-27 DIAGNOSIS — I2510 Atherosclerotic heart disease of native coronary artery without angina pectoris: Secondary | ICD-10-CM | POA: Diagnosis not present

## 2019-11-27 DIAGNOSIS — E7800 Pure hypercholesterolemia, unspecified: Secondary | ICD-10-CM

## 2019-11-27 DIAGNOSIS — I10 Essential (primary) hypertension: Secondary | ICD-10-CM

## 2019-11-27 DIAGNOSIS — E785 Hyperlipidemia, unspecified: Secondary | ICD-10-CM

## 2019-11-27 DIAGNOSIS — I25810 Atherosclerosis of coronary artery bypass graft(s) without angina pectoris: Secondary | ICD-10-CM | POA: Diagnosis not present

## 2019-11-27 DIAGNOSIS — I452 Bifascicular block: Secondary | ICD-10-CM

## 2019-11-27 NOTE — Patient Instructions (Signed)
Medication Instructions:  Your physician recommends that you continue on your current medications as directed. Please refer to the Current Medication list given to you today.  *If you need a refill on your cardiac medications before your next appointment, please call your pharmacy*   Lab Work: TO BE DONE 11/28/19: BMET, FASTING LIPIDS If you have labs (blood work) drawn today and your tests are completely normal, you will receive your results only by: Marland Kitchen MyChart Message (if you have MyChart) OR . A paper copy in the mail If you have any lab test that is abnormal or we need to change your treatment, we will call you to review the results.   Testing/Procedures: NONE   Follow-Up: At [facility], you and your health needs are our priority.  As part of our continuing mission to provide you with exceptional heart care, we have created designated Provider Care Teams.  These Care Teams include your primary Cardiologist (physician) and Advanced Practice Providers (APPs -  Physician Assistants and Nurse Practitioners) who all work together to provide you with the care you need, when you need it.  We recommend signing up for the patient portal called "MyChart".  Sign up information is provided on this After Visit Summary.  MyChart is used to connect with patients for Virtual Visits (Telemedicine).  Patients are able to view lab/test results, encounter notes, upcoming appointments, etc.  Non-urgent messages can be sent to your provider as well.   To learn more about what you can do with MyChart, go to [email or web address].    Your next appointment:   1 year(s)  The format for your next appointment:   In Person  Provider:   You may see Sinclair Grooms, MD or one of the following Advanced Practice Providers on your designated Care Team:    Truitt Merle, NP  Cecilie Kicks, NP  Kathyrn Drown, NP

## 2019-11-28 ENCOUNTER — Other Ambulatory Visit: Payer: Medicare Other | Admitting: *Deleted

## 2019-11-28 DIAGNOSIS — E7800 Pure hypercholesterolemia, unspecified: Secondary | ICD-10-CM | POA: Diagnosis not present

## 2019-11-28 DIAGNOSIS — I2510 Atherosclerotic heart disease of native coronary artery without angina pectoris: Secondary | ICD-10-CM

## 2019-11-28 DIAGNOSIS — E785 Hyperlipidemia, unspecified: Secondary | ICD-10-CM | POA: Diagnosis not present

## 2019-11-28 DIAGNOSIS — I25810 Atherosclerosis of coronary artery bypass graft(s) without angina pectoris: Secondary | ICD-10-CM

## 2019-11-28 LAB — BASIC METABOLIC PANEL
BUN/Creatinine Ratio: 28 — ABNORMAL HIGH (ref 10–24)
BUN: 23 mg/dL (ref 8–27)
CO2: 22 mmol/L (ref 20–29)
Calcium: 9.4 mg/dL (ref 8.6–10.2)
Chloride: 105 mmol/L (ref 96–106)
Creatinine, Ser: 0.83 mg/dL (ref 0.76–1.27)
GFR calc Af Amer: 97 mL/min/{1.73_m2} (ref >59)
GFR calc non Af Amer: 84 mL/min/{1.73_m2} (ref >59)
Glucose: 101 mg/dL — ABNORMAL HIGH (ref 65–99)
Potassium: 4.7 mmol/L (ref 3.5–5.2)
Sodium: 140 mmol/L (ref 134–144)

## 2019-11-28 LAB — LIPID PANEL
Chol/HDL Ratio: 2.4 ratio (ref 0.0–5.0)
Cholesterol, Total: 132 mg/dL (ref 100–199)
HDL: 55 mg/dL (ref >39)
LDL Chol Calc (NIH): 60 mg/dL (ref 0–99)
Triglycerides: 89 mg/dL (ref 0–149)
VLDL Cholesterol Cal: 17 mg/dL (ref 5–40)

## 2019-12-01 ENCOUNTER — Encounter: Payer: Self-pay | Admitting: Family Medicine

## 2019-12-16 ENCOUNTER — Other Ambulatory Visit: Payer: Self-pay | Admitting: Orthopedic Surgery

## 2019-12-16 ENCOUNTER — Telehealth: Payer: Self-pay | Admitting: *Deleted

## 2019-12-16 DIAGNOSIS — M6740 Ganglion, unspecified site: Secondary | ICD-10-CM | POA: Diagnosis not present

## 2019-12-16 DIAGNOSIS — M79644 Pain in right finger(s): Secondary | ICD-10-CM | POA: Diagnosis not present

## 2019-12-16 DIAGNOSIS — M19041 Primary osteoarthritis, right hand: Secondary | ICD-10-CM | POA: Diagnosis not present

## 2019-12-16 DIAGNOSIS — M71341 Other bursal cyst, right hand: Secondary | ICD-10-CM | POA: Diagnosis not present

## 2019-12-16 NOTE — Telephone Encounter (Signed)
   Primary Cardiologist: Sinclair Grooms, MD  Chart reviewed and patient contacted by phone today as part of pre-operative protocol coverage. Given past medical history and time since last visit, based on ACC/AHA guidelines, ALA CAPRI would be at acceptable risk for the planned procedure without further cardiovascular testing.   OK to hold aspirin 3-5 days pre op if needed, resume post op.   The patient was advised that if he develops new symptoms prior to surgery to contact our office to arrange for a follow-up visit, and he verbalized understanding.  I will route this recommendation to the requesting party via Epic fax function and remove from pre-op pool.  Please call with questions.  Kerin Ransom, PA-C 12/16/2019, 3:50 PM

## 2019-12-16 NOTE — Telephone Encounter (Signed)
   [facility] Pre-operative Risk Assessment    HEARTCARE STAFF: - Please ensure there is not already an duplicate clearance open for this procedure. - Under Visit Info/Reason for Call, type in Other and utilize the format Clearance MM/DD/YY or Clearance TBD. Do not use dashes or single digits. - If request is for dental extraction, please clarify the # of teeth to be extracted.  Request for surgical clearance:  1. What type of surgery is being performed?  RSF EXCISION MUCOID CYST AND DEBRIDEMENT DIP JOINT, POSSIBLE ROTATION FLAP   2. When is this surgery scheduled?  01/19/20   3. What type of clearance is required (medical clearance vs. Pharmacy clearance to hold med vs. Both)?  BOTH  4. Are there any medications that need to be held prior to surgery and how long? ASPIRIN   5. Practice name and name of physician performing surgery?  [facility] / DR. Fredna Dow   6. What is the office phone number?  [phone]   7.   What is the office fax number? [phone] ATTN:  BRENDA  8.   Anesthesia type (None, local, MAC, general) ?  CHOICE   William York 12/16/2019, 1:30 PM  _________________________________________________________________   (provider comments below)

## 2019-12-24 DIAGNOSIS — H25813 Combined forms of age-related cataract, bilateral: Secondary | ICD-10-CM | POA: Diagnosis not present

## 2019-12-24 DIAGNOSIS — H52203 Unspecified astigmatism, bilateral: Secondary | ICD-10-CM | POA: Diagnosis not present

## 2019-12-24 DIAGNOSIS — H5203 Hypermetropia, bilateral: Secondary | ICD-10-CM | POA: Diagnosis not present

## 2020-01-12 ENCOUNTER — Encounter (HOSPITAL_BASED_OUTPATIENT_CLINIC_OR_DEPARTMENT_OTHER): Payer: Self-pay | Admitting: Orthopedic Surgery

## 2020-01-12 NOTE — Progress Notes (Signed)

## 2020-01-13 ENCOUNTER — Encounter: Payer: Medicare Other | Admitting: Family Medicine

## 2020-01-17 ENCOUNTER — Other Ambulatory Visit (HOSPITAL_COMMUNITY)
Admission: RE | Admit: 2020-01-17 | Discharge: 2020-01-17 | Disposition: A | Discharge status: Home / Self Care | Payer: Medicare Other | Source: Ambulatory Visit | Attending: Orthopedic Surgery | Admitting: Orthopedic Surgery

## 2020-01-17 DIAGNOSIS — Z20822 Contact with and (suspected) exposure to covid-19: Secondary | ICD-10-CM | POA: Diagnosis not present

## 2020-01-17 DIAGNOSIS — Z01812 Encounter for preprocedural laboratory examination: Secondary | ICD-10-CM | POA: Insufficient documentation

## 2020-01-18 ENCOUNTER — Other Ambulatory Visit: Payer: Self-pay | Admitting: Family Medicine

## 2020-01-18 DIAGNOSIS — I10 Essential (primary) hypertension: Secondary | ICD-10-CM

## 2020-01-18 LAB — SARS CORONAVIRUS 2 (TAT 6-24 HRS): SARS Coronavirus 2: NEGATIVE

## 2020-01-22 ENCOUNTER — Other Ambulatory Visit: Payer: Self-pay

## 2020-01-23 ENCOUNTER — Other Ambulatory Visit (HOSPITAL_COMMUNITY)
Admission: RE | Admit: 2020-01-23 | Discharge: 2020-01-23 | Disposition: A | Discharge status: Home / Self Care | Payer: Medicare Other | Source: Ambulatory Visit | Attending: Orthopedic Surgery | Admitting: Orthopedic Surgery

## 2020-01-23 ENCOUNTER — Ambulatory Visit (INDEPENDENT_AMBULATORY_CARE_PROVIDER_SITE_OTHER): Payer: Medicare Other | Admitting: Family Medicine

## 2020-01-23 ENCOUNTER — Encounter: Payer: Self-pay | Admitting: Family Medicine

## 2020-01-23 VITALS — BP 118/70 | HR 72 | Resp 16 | Ht 70.0 in | Wt 189.0 lb

## 2020-01-23 DIAGNOSIS — E7800 Pure hypercholesterolemia, unspecified: Secondary | ICD-10-CM | POA: Diagnosis not present

## 2020-01-23 DIAGNOSIS — I10 Essential (primary) hypertension: Secondary | ICD-10-CM | POA: Diagnosis not present

## 2020-01-23 DIAGNOSIS — Z20822 Contact with and (suspected) exposure to covid-19: Secondary | ICD-10-CM | POA: Diagnosis not present

## 2020-01-23 DIAGNOSIS — Z0000 Encounter for general adult medical examination without abnormal findings: Secondary | ICD-10-CM | POA: Diagnosis not present

## 2020-01-23 DIAGNOSIS — Z01812 Encounter for preprocedural laboratory examination: Secondary | ICD-10-CM | POA: Insufficient documentation

## 2020-01-23 DIAGNOSIS — I2510 Atherosclerotic heart disease of native coronary artery without angina pectoris: Secondary | ICD-10-CM

## 2020-01-23 DIAGNOSIS — R7303 Prediabetes: Secondary | ICD-10-CM | POA: Diagnosis not present

## 2020-01-23 LAB — POCT GLYCOSYLATED HEMOGLOBIN (HGB A1C): Hemoglobin A1C: 5.5 % (ref 4.0–5.6)

## 2020-01-23 LAB — SARS CORONAVIRUS 2 (TAT 6-24 HRS): SARS Coronavirus 2: NEGATIVE

## 2020-01-23 MED ORDER — NITROGLYCERIN 0.4 MG SL SUBL: 0.4000 mg | SUBLINGUAL_TABLET | SUBLINGUAL | 0 refills | Status: DC | PRN

## 2020-01-23 NOTE — Progress Notes (Signed)
HPI:  [name] is a 80 y.o.male here today for his routine physical examination.  Last CPE: 01/07/19. He lives with his wife.  Regular exercise 3 or more times per week: Walking 4-5 times per week for about 40 min. Following a healthy diet: Yes, cooks at home. His wife makes sure he gets lot of vegetables.  Chronic medical problems: OA,HLD,HLD,CAD,allergic rhinitis,and hearing loss among some.  Immunization History  Administered Date(s) Administered  . Fluad Quad(high Dose 65+) 09/12/2018  . Influenza Split 09/18/2012  . Influenza Whole 11/01/2006, 10/09/2008, 09/23/2011  . Influenza, High Dose Seasonal PF 10/07/2013, 09/30/2017  . Influenza-Unspecified 10/04/2014, 10/05/2015, 09/20/2016, 09/18/2019  . PFIZER SARS-COV-2 Vaccination 01/23/2019, 02/11/2019, 10/10/2019  . Pneumococcal Conjugate-13 10/07/2013  . Pneumococcal Polysaccharide-23 01/09/1997, 07/01/2004, 08/15/2012  . Td 06/26/2006  . Tdap 07/21/2010  . Zoster 09/23/2012  . Zoster Recombinat (Shingrix) 10/18/2018, 12/20/2018   -Hep C screening: Never.  Last colon cancer screening: Colonoscopy 07/22/2014, no follow up was recommended. Last prostate ca screening: PSA done at his urologist's office 07/2019, 0.7.  Negative for high alcohol intake, tobacco use, or Hx of illicit drug use.  Prediabetes: Negative for polydipsia,polyuria, or polyphagia.  Lab Results  Component Value Date   HGBA1C 6.0 01/07/2019   HLD: He is on Atorvastatin 20 mg daily. Had labs recently at his cardiologist's office.  Lab Results  Component Value Date   CHOL 132 11/28/2019   HDL 55 11/28/2019   LDLCALC 60 11/28/2019   TRIG 89 11/28/2019   CHOLHDL 2.4 11/28/2019   HTN on Metoprolol succinate 50 mg daily and Losartan 100 mg daily. CAD: He has not had CP. Needs refills on Nitroglycerine, Rx expired.  Review of Systems  Constitutional: Negative for activity change, appetite change, fatigue and fever.  HENT: Negative for  mouth sores, nosebleeds and sore throat.   Eyes: Negative for redness and visual disturbance.  Respiratory: Negative for cough, shortness of breath and wheezing.   Cardiovascular: Negative for chest pain, palpitations and leg swelling.  Gastrointestinal: Negative for abdominal pain, nausea and vomiting.  Endocrine: Negative for cold intolerance and heat intolerance.  Genitourinary: Negative for decreased urine volume, dysuria and hematuria.  Skin: Negative for pallor and rash.  Allergic/Immunologic: Positive for environmental allergies.  Neurological: Negative for dizziness, seizures, weakness, numbness and headaches.  Psychiatric/Behavioral: Negative for behavioral problems and confusion.  All other systems reviewed and are negative.  Current Outpatient Medications on File Prior to Visit  Medication Sig Dispense Refill  . amLODipine (NORVASC) 2.5 MG tablet Take 1 tablet (2.5 mg total) by mouth daily. 90 tablet 3  . aspirin EC 81 MG tablet Take 1 tablet (81 mg total) by mouth daily. 90 tablet 3  . atorvastatin (LIPITOR) 20 MG tablet TAKE 1 TABLET BY MOUTH  DAILY 90 tablet 3  . losartan (COZAAR) 100 MG tablet TAKE 1 TABLET BY MOUTH  DAILY 90 tablet 3  . metoprolol succinate (TOPROL-XL) 50 MG 24 hr tablet TAKE 1 TABLET BY MOUTH  DAILY WITH OR IMMEDIATELY  FOLLOWING A MEAL 90 tablet 3  . valACYclovir (VALTREX) 1000 MG tablet TAKE 2 TABLETS BY MOUTH  UPON ACUTE ONSET AND REPEAT DOSE IN 12 HOURS. 16 tablet 3   No current facility-administered medications on file prior to visit.     Past Medical History:  Diagnosis Date  . Allergy   . CAD (coronary artery disease)   . ED (erectile dysfunction)   . Hyperlipidemia   . Hypertension   .  Internal hemorrhoids   . OA (osteoarthritis)     Past Surgical History:  Procedure Laterality Date  . CORONARY ARTERY BYPASS GRAFT  June 1999  . HERNIA REPAIR  May 2006    No Known Allergies  Family History  Problem Relation Age of Onset  . Colon  cancer Cousin        Mother side-First cousin  . Stroke Father     Social History   Socioeconomic History  . Marital status: Married    Spouse name: Not on file  . Number of children: 2  . Years of education: 18 years  . Highest education level: Master's degree (e.g., MA, MS, MEng, MEd, MSW, MBA)  Occupational History  . Occupation: Retired  Tobacco Use  . Smoking status: Never Smoker  . Smokeless tobacco: Never Used  Vaping Use  . Vaping Use: Never used  Substance and Sexual Activity  . Alcohol use: Yes    Comment: one daily   . Drug use: No  . Sexual activity: Not on file  Other Topics Concern  . Not on file  Social History Narrative   Married    [place] 2   1 son + 1 Daughter   5 grandchildren      Social Determinants of Health   Financial Resource Strain: Not on file  Food Insecurity: Not on file  Transportation Needs: Not on file  Physical Activity: Not on file  Stress: Not on file  Social Connections: Not on file   Vitals:   01/23/20 0757  BP: 118/70  Pulse: 72  Resp: 16  SpO2: 96%   Body mass index is 27.12 kg/m.  Wt Readings from Last 3 Encounters:  01/23/20 189 lb (85.7 kg)  11/27/19 189 lb (85.7 kg)  01/21/19 183 lb (83 kg)    Physical Exam Vitals and nursing note reviewed.  Constitutional:      General: He is not in acute distress.    Appearance: He is well-developed.  HENT:     Head: Atraumatic.     Right Ear: Tympanic membrane, ear canal and external ear normal.     Left Ear: Tympanic membrane, ear canal and external ear normal.     Mouth/Throat:     Mouth: Oropharynx is clear and moist and mucous membranes are normal. Mucous membranes are moist.     Pharynx: Oropharynx is clear.  Eyes:     Extraocular Movements: EOM normal.     Conjunctiva/sclera: Conjunctivae normal.     Pupils: Pupils are equal, round, and reactive to light.  Neck:     Thyroid: No thyromegaly.     Trachea: No tracheal deviation.  Cardiovascular:     Rate and  Rhythm: Normal rate and regular rhythm.     Pulses:          Dorsalis pedis pulses are 2+ on the right side and 2+ on the left side.     Heart sounds: No murmur heard.     Comments: Trace pitting LE edema, bilateral. Pulmonary:     Effort: Pulmonary effort is normal. No respiratory distress.     Breath sounds: Normal breath sounds.  Abdominal:     Palpations: Abdomen is soft. There is no hepatomegaly or mass.     Tenderness: There is no abdominal tenderness.  Genitourinary:    Comments: Deferred to urologist. Musculoskeletal:        General: No tenderness or edema.     Cervical back: Normal range of motion. No tenderness  or bony tenderness.     Comments: In some IP joints with Heberden's and Bouchard's nodes. No signs of synovitis.   Lymphadenopathy:     Cervical: No cervical adenopathy.  Skin:    General: Skin is warm.     Findings: No erythema.  Neurological:     General: No focal deficit present.     Mental Status: He is alert and oriented to person, place, and time.     Cranial Nerves: No cranial nerve deficit.     Deep Tendon Reflexes: Strength normal.     Reflex Scores:      Bicep reflexes are 2+ on the right side and 2+ on the left side.      Patellar reflexes are 2+ on the right side and 2+ on the left side.    Comments: Stable gait, not assisted.  Psychiatric:        Mood and Affect: Mood and affect normal.        Cognition and Memory: Cognition and memory normal.    ASSESSMENT AND PLAN:  [email or web address] was seen today for annual exam.  Diagnoses and all orders for this visit: Orders Placed This Encounter  Procedures  . POC HgB A1c   Lab Results  Component Value Date   HGBA1C 5.5 01/23/2020   Routine general medical examination at a health care facility He understands the importance of regular physical activity and healthy diet for prevention of chronic illness and/or complications. Preventive guidelines reviewed. Vaccination up to date. Next visit HCV  screening. Next CPE in a year.  Essential hypertension BP adequately controlled. Continue Metoprolol succinate and Losartan same dose.  HYPERCHOLESTEROLEMIA Well controlled, LDL at goal. Continue Atorvastatin 20 mg daily.  Coronary artery disease involving native coronary artery of native heart without angina pectoris Asymptomatic. Continue Metoprolol succinate 50 mg daily, Aspirin 81 mg daily,and Atorvastatin 20 mg daily.  -     nitroGLYCERIN (NITROSTAT) 0.4 MG SL tablet; Place 1 tablet (0.4 mg total) under the tongue every 5 (five) minutes as needed.  Prediabetes HgA1C improved. Continue a healthy life style for primary prevention of diabetes.   Return in 6 months (on 07/22/2020) for HTN.   Andry Bogden G. [place], MD  [facility]. [place] office.  A few things to remember from today's visit:  Routine general medical examination at a health care facility  Essential hypertension  HYPERCHOLESTEROLEMIA  Coronary artery disease involving native coronary artery of native heart without angina pectoris - Plan: nitroGLYCERIN (NITROSTAT) 0.4 MG SL tablet  Prediabetes  If you need refills please call your pharmacy. Do not use My Chart to request refills or for acute issues that need immediate attention.    Please be sure medication list is accurate. If a new problem present, please set up appointment sooner than planned today.  A few tips:  -As we age balance is not as good as it was, so there is a higher risks for falls. Please remove small rugs and furniture that is "in your way" and could increase the risk of falls. Stretching exercises may help with fall prevention: Yoga and Tai Chi are some examples. Low impact exercise is better, so you are not very achy the next day.  -Sun screen and avoidance of direct sun light recommended. Caution with dehydration, if working outdoors be sure to drink enough fluids.  - Some medications are not safe as we age, increases the  risk of side effects and can potentially interact with other medication you are also  taken;  including some of over the counter medications. Be sure to let me know when you start a new medication even if it is a dietary/vitamin supplement.   -Healthy diet low in red meet/animal fat and sugar + regular physical activity is recommended.    We will plan on labs next visit, then next physical in 05/2021 and annually thereafter.

## 2020-01-23 NOTE — Patient Instructions (Addendum)
A few things to remember from today's visit:  Routine general medical examination at a health care facility  Essential hypertension  HYPERCHOLESTEROLEMIA  Coronary artery disease involving native coronary artery of native heart without angina pectoris - Plan: nitroGLYCERIN (NITROSTAT) 0.4 MG SL tablet  Prediabetes  If you need refills please call your pharmacy. Do not use My Chart to request refills or for acute issues that need immediate attention.    Please be sure medication list is accurate. If a new problem present, please set up appointment sooner than planned today.  A few tips:  -As we age balance is not as good as it was, so there is a higher risks for falls. Please remove small rugs and furniture that is "in your way" and could increase the risk of falls. Stretching exercises may help with fall prevention: Yoga and Tai Chi are some examples. Low impact exercise is better, so you are not very achy the next day.  -Sun screen and avoidance of direct sun light recommended. Caution with dehydration, if working outdoors be sure to drink enough fluids.  - Some medications are not safe as we age, increases the risk of side effects and can potentially interact with other medication you are also taken;  including some of over the counter medications. Be sure to let me know when you start a new medication even if it is a dietary/vitamin supplement.   -Healthy diet low in red meet/animal fat and sugar + regular physical activity is recommended.    We will plan on labs next visit, then next physical in 05/2021 and annually thereafter.

## 2020-01-26 ENCOUNTER — Encounter: Payer: Medicare Other | Admitting: Family Medicine

## 2020-01-27 ENCOUNTER — Encounter (HOSPITAL_BASED_OUTPATIENT_CLINIC_OR_DEPARTMENT_OTHER)
Admission: RE | Disposition: A | Discharge status: Home / Self Care | Payer: Self-pay | Source: Home / Self Care | Attending: Orthopedic Surgery

## 2020-01-27 ENCOUNTER — Ambulatory Visit (HOSPITAL_BASED_OUTPATIENT_CLINIC_OR_DEPARTMENT_OTHER)
Admission: RE | Admit: 2020-01-27 | Discharge: 2020-01-27 | Disposition: A | Discharge status: Home / Self Care | Payer: Medicare Other | Attending: Orthopedic Surgery | Admitting: Orthopedic Surgery

## 2020-01-27 ENCOUNTER — Encounter (HOSPITAL_BASED_OUTPATIENT_CLINIC_OR_DEPARTMENT_OTHER): Payer: Self-pay | Admitting: Orthopedic Surgery

## 2020-01-27 ENCOUNTER — Ambulatory Visit (HOSPITAL_BASED_OUTPATIENT_CLINIC_OR_DEPARTMENT_OTHER): Payer: Medicare Other | Admitting: Anesthesiology

## 2020-01-27 ENCOUNTER — Ambulatory Visit: Payer: Medicare Other

## 2020-01-27 ENCOUNTER — Other Ambulatory Visit: Payer: Self-pay

## 2020-01-27 DIAGNOSIS — E7800 Pure hypercholesterolemia, unspecified: Secondary | ICD-10-CM | POA: Diagnosis not present

## 2020-01-27 DIAGNOSIS — Z951 Presence of aortocoronary bypass graft: Secondary | ICD-10-CM | POA: Diagnosis not present

## 2020-01-27 DIAGNOSIS — M67441 Ganglion, right hand: Secondary | ICD-10-CM | POA: Diagnosis not present

## 2020-01-27 DIAGNOSIS — M19041 Primary osteoarthritis, right hand: Secondary | ICD-10-CM | POA: Diagnosis not present

## 2020-01-27 DIAGNOSIS — M71341 Other bursal cyst, right hand: Secondary | ICD-10-CM | POA: Diagnosis not present

## 2020-01-27 DIAGNOSIS — Z7982 Long term (current) use of aspirin: Secondary | ICD-10-CM | POA: Insufficient documentation

## 2020-01-27 DIAGNOSIS — Z79899 Other long term (current) drug therapy: Secondary | ICD-10-CM | POA: Diagnosis not present

## 2020-01-27 DIAGNOSIS — I10 Essential (primary) hypertension: Secondary | ICD-10-CM | POA: Diagnosis not present

## 2020-01-27 DIAGNOSIS — M25841 Other specified joint disorders, right hand: Secondary | ICD-10-CM | POA: Diagnosis not present

## 2020-01-27 HISTORY — PX: MASS EXCISION: SHX2000

## 2020-01-27 SURGERY — EXCISION MASS
Anesthesia: Monitor Anesthesia Care | Site: Finger | Laterality: Right

## 2020-01-27 MED ORDER — FENTANYL CITRATE (PF) 100 MCG/2ML IJ SOLN
INTRAMUSCULAR | Status: AC
  Filled 2020-01-27: qty 2

## 2020-01-27 MED ORDER — FENTANYL CITRATE (PF) 100 MCG/2ML IJ SOLN
INTRAMUSCULAR | Status: DC | PRN
  Administered 2020-01-27 (×2): 50 ug via INTRAVENOUS

## 2020-01-27 MED ORDER — ONDANSETRON HCL 4 MG/2ML IJ SOLN
INTRAMUSCULAR | Status: DC | PRN
  Administered 2020-01-27: 4 mg via INTRAVENOUS

## 2020-01-27 MED ORDER — PROPOFOL 10 MG/ML IV BOLUS
INTRAVENOUS | Status: DC | PRN
  Administered 2020-01-27: 20 mg via INTRAVENOUS

## 2020-01-27 MED ORDER — PROPOFOL 500 MG/50ML IV EMUL
INTRAVENOUS | Status: DC | PRN
  Administered 2020-01-27: 50 ug/kg/min via INTRAVENOUS

## 2020-01-27 MED ORDER — BUPIVACAINE HCL (PF) 0.25 % IJ SOLN
INTRAMUSCULAR | Status: DC | PRN
  Administered 2020-01-27: 10 mL

## 2020-01-27 MED ORDER — ONDANSETRON HCL 4 MG/2ML IJ SOLN
INTRAMUSCULAR | Status: AC
  Filled 2020-01-27: qty 2

## 2020-01-27 MED ORDER — CEFAZOLIN SODIUM-DEXTROSE 2-4 GM/100ML-% IV SOLN
2.0000 g | INTRAVENOUS | Status: AC
  Administered 2020-01-27: 2 g via INTRAVENOUS

## 2020-01-27 MED ORDER — HYDROCODONE-ACETAMINOPHEN 5-325 MG PO TABS: ORAL_TABLET | ORAL | 0 refills | Status: DC

## 2020-01-27 MED ORDER — LACTATED RINGERS IV SOLN: INTRAVENOUS | Status: DC

## 2020-01-27 MED ORDER — GLYCOPYRROLATE 0.2 MG/ML IJ SOLN
INTRAMUSCULAR | Status: DC | PRN
  Administered 2020-01-27: .1 mg via INTRAVENOUS

## 2020-01-27 SURGICAL SUPPLY — 59 items
APL PRP STRL LF DISP 70% ISPRP (MISCELLANEOUS) ×2
APL SKNCLS STERI-STRIP NONHPOA (GAUZE/BANDAGES/DRESSINGS)
BENZOIN TINCTURE PRP APPL 2/3 (GAUZE/BANDAGES/DRESSINGS) IMPLANT
BLADE MINI RND TIP GREEN BEAV (BLADE) IMPLANT
BLADE SURG 15 STRL LF DISP TIS (BLADE) ×4 IMPLANT
BLADE SURG 15 STRL SS (BLADE) ×6
BNDG CMPR 9X4 STRL LF SNTH (GAUZE/BANDAGES/DRESSINGS)
BNDG COHESIVE 1X5 TAN STRL LF (GAUZE/BANDAGES/DRESSINGS) ×2 IMPLANT
BNDG COHESIVE 2X5 TAN STRL LF (GAUZE/BANDAGES/DRESSINGS) IMPLANT
BNDG CONFORM 2 STRL LF (GAUZE/BANDAGES/DRESSINGS) IMPLANT
BNDG ELASTIC 2X5.8 VLCR STR LF (GAUZE/BANDAGES/DRESSINGS) IMPLANT
BNDG ELASTIC 3X5.8 VLCR STR LF (GAUZE/BANDAGES/DRESSINGS) IMPLANT
BNDG ESMARK 4X9 LF (GAUZE/BANDAGES/DRESSINGS) IMPLANT
BNDG GAUZE 1X2.1 STRL (MISCELLANEOUS) IMPLANT
BNDG GAUZE ELAST 4 BULKY (GAUZE/BANDAGES/DRESSINGS) IMPLANT
BNDG PLASTER X FAST 3X3 WHT LF (CAST SUPPLIES) IMPLANT
BNDG PLSTR 9X3 FST ST WHT (CAST SUPPLIES)
CHLORAPREP W/TINT 26 (MISCELLANEOUS) ×3 IMPLANT
CORD BIPOLAR FORCEPS 12FT (ELECTRODE) ×3 IMPLANT
COVER BACK TABLE 60X90IN (DRAPES) ×3 IMPLANT
COVER MAYO STAND STRL (DRAPES) ×3 IMPLANT
COVER WAND RF STERILE (DRAPES) IMPLANT
CUFF TOURN SGL QUICK 18X4 (TOURNIQUET CUFF) ×3 IMPLANT
DRAPE EXTREMITY T 121X128X90 (DISPOSABLE) ×3 IMPLANT
DRAPE SURG 17X23 STRL (DRAPES) ×3 IMPLANT
GAUZE SPONGE 4X4 12PLY STRL (GAUZE/BANDAGES/DRESSINGS) ×3 IMPLANT
GAUZE XEROFORM 1X8 LF (GAUZE/BANDAGES/DRESSINGS) ×3 IMPLANT
GLOVE BIO SURGEON STRL SZ7.5 (GLOVE) ×3 IMPLANT
GLOVE SRG 8 PF TXTR STRL LF DI (GLOVE) ×2 IMPLANT
GLOVE SURG ORTHO LTX SZ8 (GLOVE) ×2 IMPLANT
GLOVE SURG SS PI 8.0 STRL IVOR (GLOVE) ×2 IMPLANT
GLOVE SURG UNDER POLY LF SZ7 (GLOVE) ×2 IMPLANT
GLOVE SURG UNDER POLY LF SZ8 (GLOVE) ×3
GOWN STRL REUS W/ TWL LRG LVL3 (GOWN DISPOSABLE) ×1 IMPLANT
GOWN STRL REUS W/TWL 2XL LVL3 (GOWN DISPOSABLE) ×2 IMPLANT
GOWN STRL REUS W/TWL LRG LVL3 (GOWN DISPOSABLE)
GOWN STRL REUS W/TWL XL LVL3 (GOWN DISPOSABLE) ×5 IMPLANT
NDL HYPO 25X1 1.5 SAFETY (NEEDLE) ×1 IMPLANT
NEEDLE HYPO 25X1 1.5 SAFETY (NEEDLE) ×3 IMPLANT
NS IRRIG 1000ML POUR BTL (IV SOLUTION) ×3 IMPLANT
PACK BASIN DAY SURGERY FS (CUSTOM PROCEDURE TRAY) ×3 IMPLANT
PAD CAST 3X4 CTTN HI CHSV (CAST SUPPLIES) IMPLANT
PAD CAST 4YDX4 CTTN HI CHSV (CAST SUPPLIES) IMPLANT
PADDING CAST ABS 4INX4YD NS (CAST SUPPLIES)
PADDING CAST ABS COTTON 4X4 ST (CAST SUPPLIES) ×1 IMPLANT
PADDING CAST COTTON 3X4 STRL (CAST SUPPLIES)
PADDING CAST COTTON 4X4 STRL (CAST SUPPLIES)
SPLINT FINGER 3.25 911903 (SOFTGOODS) ×2 IMPLANT
STOCKINETTE 4X48 STRL (DRAPES) ×3 IMPLANT
STRIP CLOSURE SKIN 1/2X4 (GAUZE/BANDAGES/DRESSINGS) IMPLANT
SUT ETHILON 3 0 PS 1 (SUTURE) IMPLANT
SUT ETHILON 4 0 PS 2 18 (SUTURE) ×3 IMPLANT
SUT ETHILON 5 0 P 3 18 (SUTURE)
SUT NYLON ETHILON 5-0 P-3 1X18 (SUTURE) IMPLANT
SUT VIC AB 4-0 P2 18 (SUTURE) IMPLANT
SYR BULB EAR ULCER 3OZ GRN STR (SYRINGE) ×3 IMPLANT
SYR CONTROL 10ML LL (SYRINGE) ×3 IMPLANT
TOWEL GREEN STERILE FF (TOWEL DISPOSABLE) ×6 IMPLANT
UNDERPAD 30X36 HEAVY ABSORB (UNDERPADS AND DIAPERS) ×3 IMPLANT

## 2020-01-27 NOTE — Anesthesia Postprocedure Evaluation (Signed)
Anesthesia Post Note  Patient: William York  Procedure(s) Performed: RIGHT SMALL FINGER EXCISION MUCOID CYST (Right Finger)     Patient location during evaluation: PACU Anesthesia Type: MAC and Bier Block Level of consciousness: awake and alert and oriented Pain management: pain level controlled Vital Signs Assessment: post-procedure vital signs reviewed and stable Respiratory status: spontaneous breathing, nonlabored ventilation and respiratory function stable Cardiovascular status: stable and blood pressure returned to baseline Postop Assessment: no apparent nausea or vomiting Anesthetic complications: no   No complications documented.  Last Vitals:  Vitals:   01/27/20 1413 01/27/20 1415  BP: 118/72 135/77  Pulse: 66 65  Resp: 18 17  Temp:  36.5 C  SpO2: 97% 100%    Last Pain:  Vitals:   01/27/20 1413  TempSrc:   PainSc: 0-No pain                 Teira Arcilla A.

## 2020-01-27 NOTE — Op Note (Signed)
NAME: William York MEDICAL RECORD NO: [id number] DATE OF BIRTH: 1940/07/31 FACILITY: [facility] LOCATION: [facility] PHYSICIAN: Tennis Must, MD   OPERATIVE REPORT   DATE OF PROCEDURE: 01/27/20    PREOPERATIVE DIAGNOSIS:   Right small finger mucoid cyst and DIP joint arthritis   POSTOPERATIVE DIAGNOSIS:   Right small finger mucoid cyst and DIP joint arthritis   PROCEDURE:   1.  Right small finger excision of mucoid cyst 2.  Right small finger debridement of DIP joint   SURGEON:  Leanora Cover, M.D.   ASSISTANT: Daryll Brod, MD   ANESTHESIA:  Bier block with sedation   INTRAVENOUS FLUIDS:  Per anesthesia flow sheet.   ESTIMATED BLOOD LOSS:  Minimal.   COMPLICATIONS:  None.   SPECIMENS:   Right small finger mucoid cyst to pathology   TOURNIQUET TIME:    Total Tourniquet Time Documented: Forearm (Right) - 24 minutes Total: Forearm (Right) - 24 minutes    DISPOSITION:  Stable to PACU.   INDICATIONS: 80 year old male with mass on right small finger.  This is bothersome to him.  It has drained in the past.  He wishes to have it removed and DIP joint debrided to try to prevent recurrence Risks, benefits and alternatives of surgery were discussed including the risks of blood loss, infection, damage to nerves, vessels, tendons, ligaments, bone for surgery, need for additional surgery, complications with wound healing, continued pain, stiffness recurrence.  He voiced understanding of these risks and elected to proceed.  OPERATIVE COURSE:  After being identified preoperatively by myself,  the patient and I agreed on the procedure and site of the procedure.  The surgical site was marked.  Surgical consent had been signed. He was given IV antibiotics as preoperative antibiotic prophylaxis. He was transferred to the operating room and placed on the operating table in supine position with the Right Right upper extremity on an arm board.  Bier block anesthesia was induced  by the anesthesiologist.  Right upper extremity was prepped and draped in normal sterile orthopedic fashion.  A surgical pause was performed between the surgeons, anesthesia, and operating room staff and all were in agreement as to the patient, procedure, and site of procedure.  Tourniquet at the proximal aspect of the forearm had been inflated for the Bier block.    A digital block was performed with 10 cc of quarter percent plain Marcaine.  A hockey-stick shaped incision was made over the DIP joint of the right small finger.  This is carried into the subcutaneous tissue any technique.  The cyst was identified.  It was carefully freed up from surrounding soft tissues.  Was removed with a synovectomy rongeurs.  It was sent to pathology for examination.  The DIP joint was entered underneath the extensor tendon.  The DIP joint was debrided.  There was some cyst underneath the tendon as well.  There was a prominence of bone at the ulnar side of the distal phalanx at the level of the DIP joint which was taken down with the rongeurs.  The wound and joint were copiously irrigated with sterile saline.  The wound was closed with 4-0 nylon in a horizontal mattress fashion.  The tourniquet was deflated at 24 minutes.  Fingertips were pink with brisk capillary refill after deflation of tourniquet.  The operative  drapes were broken down.  The patient was awoken from anesthesia safely.  He was transferred back to the stretcher and taken to PACU in stable condition.  I will see him back in the office in 1 week for postoperative followup.  I will give him a prescription for Norco 5/325 1-2 tabs PO q6 hours prn pain, dispense # 20.   Leanora Cover, MD Electronically signed, 01/27/20

## 2020-01-27 NOTE — Discharge Instructions (Addendum)

## 2020-01-27 NOTE — Anesthesia Procedure Notes (Signed)
Anesthesia Regional Block: Bier block (IV Regional)   Pre-Anesthetic Checklist: ,, timeout performed, Correct Patient, Correct Site, Correct Laterality, Correct Procedure,, site marked, surgical consent,, at surgeon's request  Laterality: Right     Needles:  Injection technique: Single-shot  Needle Type: Other   (20 ga IV cath)    Needle Gauge: 22     Additional Needles:   Procedures:,,,,, intact distal pulses, Esmarch exsanguination, single tourniquet utilized,  Narrative:  Start time: 01/27/2020 1:40 PM End time: 01/27/2020 1:41 PM  Performed by: Personally

## 2020-01-27 NOTE — H&P (Signed)
  William York is an 80 y.o. male.   Chief Complaint: finger cyst HPI: 80 yo male with cyst on right small finger at base of nail.  This has drained several times.  It is bothersome to him and he wishes to have it removed and the joint debrided to try to prevent recurrence.  Allergies: No Known Allergies  Past Medical History:  Diagnosis Date  . Allergy   . CAD (coronary artery disease)   . ED (erectile dysfunction)   . Hyperlipidemia   . Hypertension   . Internal hemorrhoids   . OA (osteoarthritis)     Past Surgical History:  Procedure Laterality Date  . CORONARY ARTERY BYPASS GRAFT  June 1999  . HERNIA REPAIR  May 2006    Family History: Family History  Problem Relation Age of Onset  . Colon cancer Cousin        Mother side-First cousin  . Stroke Father     Social History:   reports that he has never smoked. He has never used smokeless tobacco. He reports current alcohol use. He reports that he does not use drugs.  Medications: Medications Prior to Admission  Medication Sig Dispense Refill  . amLODipine (NORVASC) 2.5 MG tablet Take 1 tablet (2.5 mg total) by mouth daily. 90 tablet 3  . aspirin EC 81 MG tablet Take 1 tablet (81 mg total) by mouth daily. 90 tablet 3  . atorvastatin (LIPITOR) 20 MG tablet TAKE 1 TABLET BY MOUTH  DAILY 90 tablet 3  . losartan (COZAAR) 100 MG tablet TAKE 1 TABLET BY MOUTH  DAILY 90 tablet 3  . metoprolol succinate (TOPROL-XL) 50 MG 24 hr tablet TAKE 1 TABLET BY MOUTH  DAILY WITH OR IMMEDIATELY  FOLLOWING A MEAL 90 tablet 3  . nitroGLYCERIN (NITROSTAT) 0.4 MG SL tablet Place 1 tablet (0.4 mg total) under the tongue every 5 (five) minutes as needed. 25 tablet 0  . valACYclovir (VALTREX) 1000 MG tablet TAKE 2 TABLETS BY MOUTH  UPON ACUTE ONSET AND REPEAT DOSE IN 12 HOURS. 16 tablet 3    No results found for this or any previous visit (from the past 48 hour(s)).  No results found.   A comprehensive review of systems was  negative.  Blood pressure (!) 160/72, pulse 69, temperature 97.8 F (36.6 C), temperature source Oral, resp. rate 18, height 5\' 10"  (1.778 m), weight 85.3 kg, SpO2 99 %.  General appearance: alert, cooperative and appears stated age Head: Normocephalic, without obvious abnormality, atraumatic Neck: supple, symmetrical, trachea midline Cardio: regular rate and rhythm Resp: clear to auscultation bilaterally Extremities: Intact sensation and capillary refill all digits.  +epl/fpl/io.  No wounds.  Pulses: 2+ and symmetric Skin: Skin color, texture, turgor normal. No rashes or lesions Neurologic: Grossly normal Incision/Wound: none  Assessment/Plan Right small finger mucoid cyst.  Non operative and operative treatment options have been discussed with the patient and patient wishes to proceed with operative treatment. Risks, benefits, and alternatives of surgery have been discussed and the patient agrees with the plan of care.   Leanora Cover 01/27/2020, 12:39 PM

## 2020-01-27 NOTE — Anesthesia Preprocedure Evaluation (Addendum)
Anesthesia Evaluation  Patient identified by MRN, date of birth, ID band Patient awake    Reviewed: Allergy & Precautions, NPO status , Patient's Chart, lab work & pertinent test results, reviewed documented beta blocker date and time   Airway Mallampati: II  TM Distance: >3 FB Neck ROM: Full    Dental no notable dental hx. (+) Teeth Intact   Pulmonary neg pulmonary ROS,    Pulmonary exam normal breath sounds clear to auscultation       Cardiovascular hypertension, Pt. on medications and Pt. on home beta blockers + CAD and + CABG  Normal cardiovascular exam Rhythm:Regular Rate:Normal  Echo 11/22/19 Left ventricle: The cavity size was normal. There was mild concentric hypertrophy. Systolic function was normal. The estimated ejection fraction was in the range of 55% to 60%. Wall motion was normal; there were no regional wall motion abnormalities. Left ventricular diastolic function parameters were normal.  - Aortic valve: Trileaflet; mildly thickened, mildly calcified  leaflets. There was trivial regurgitation.  - Aorta: Fibrocalcific change was present in the root.  - Mitral valve: Mildly thickened leaflets . There was trivial  regurgitation.  - Left atrium: The atrium was moderately dilated.  - Right ventricle: Systolic function was low normal.  - Atrial septum: No defect or patent foramen ovale was identified.  - Tricuspid valve: There was trivial regurgitation.  - Pulmonic valve: There was mild regurgitation.   EKG 11/26/19 NSR, RBBB pattern, unchanged from previous tracing  Stress test 11/2017  The left ventricular ejection fraction is normal (55-65%).  Nuclear stress EF: 55%.  Blood pressure demonstrated a normal response to exercise.  There was no ST segment deviation noted during stress.  The study is normal.  This is a low risk study.   Normal resting and stress perfusion. No ischemia or infarction EF 55%  frequent PVC with stress and in recovery    CABG x 6 06/1997   Neuro/Psych Bilateral hearing loss negative psych ROS   GI/Hepatic   Endo/Other  Hyperlipidemia  Renal/GU    ED    Musculoskeletal  (+) Arthritis , Osteoarthritis,  Right small finger mucoid cyst DIP   Abdominal (+) - obese,   Peds  Hematology   Anesthesia Other Findings   Reproductive/Obstetrics HSV                            Anesthesia Physical Anesthesia Plan  ASA: III  Anesthesia Plan: MAC and Bier Block and Bier Block-LIDOCAINE ONLY   Post-op Pain Management:    Induction: Intravenous  PONV Risk Score and Plan: Ondansetron, Treatment may vary due to age or medical condition and Propofol infusion  Airway Management Planned: Natural Airway, Simple Face Mask and Nasal Cannula  Additional Equipment:   Intra-op Plan:   Post-operative Plan:   Informed Consent: I have reviewed the patients History and Physical, chart, labs and discussed the procedure including the risks, benefits and alternatives for the proposed anesthesia with the patient or authorized representative who has indicated his/her understanding and acceptance.     Dental advisory given  Plan Discussed with: CRNA and Anesthesiologist  Anesthesia Plan Comments:        Anesthesia Quick Evaluation

## 2020-01-27 NOTE — Op Note (Signed)
I assisted Surgeon(s) and Role:    * Leanora Cover, MD - Primary    * Daryll Brod, MD - Assisting on the Procedure(s): RIGHT SMALL FINGER EXCISION MUCOID CYST DEBRIDEMENT DISTAL INTERPHALANGEAL JOINT POSSIBLE ROTATION FLAP on 01/27/2020.  I provided assistance on this case as follows:setup, approach, retraction, identification, and excision of the cyst, arthrotomy and debridement of the joint, closure of the wound and application of the dressing and splint. Electronically signed by: Daryll Brod, MD Date: 01/27/2020 Time: 2:06 PM

## 2020-01-27 NOTE — Transfer of Care (Signed)
Immediate Anesthesia Transfer of Care Note  Patient: William York  Procedure(s) Performed: RIGHT SMALL FINGER EXCISION MUCOID CYST (Right Finger)  Patient Location: PACU  Anesthesia Type:MAC and Bier block  Level of Consciousness: awake, alert , oriented and patient cooperative  Airway & Oxygen Therapy: Patient Spontanous Breathing and Patient connected to face mask oxygen  Post-op Assessment: Report given to RN and Post -op Vital signs reviewed and stable  Post vital signs: Reviewed and stable  Last Vitals:  Vitals Value Taken Time  BP    Temp    Pulse    Resp    SpO2      Last Pain:  Vitals:   01/27/20 1206  TempSrc: Oral  PainSc: 0-No pain      Patients Stated Pain Goal: 3 (89/21/19 4174)  Complications: No complications documented.

## 2020-01-28 ENCOUNTER — Encounter (HOSPITAL_BASED_OUTPATIENT_CLINIC_OR_DEPARTMENT_OTHER): Payer: Self-pay | Admitting: Orthopedic Surgery

## 2020-01-28 LAB — SURGICAL PATHOLOGY

## 2020-01-28 NOTE — Addendum Note (Signed)
Addendum  created 01/28/20 1214 by Genine Beckett, Ernesta Amble, CRNA   Charge Capture section accepted

## 2020-02-04 ENCOUNTER — Other Ambulatory Visit: Payer: Self-pay | Admitting: Family Medicine

## 2020-02-04 DIAGNOSIS — I10 Essential (primary) hypertension: Secondary | ICD-10-CM

## 2020-02-11 ENCOUNTER — Ambulatory Visit (INDEPENDENT_AMBULATORY_CARE_PROVIDER_SITE_OTHER): Payer: Medicare Other

## 2020-02-11 ENCOUNTER — Other Ambulatory Visit: Payer: Self-pay

## 2020-02-11 DIAGNOSIS — Z0000 Encounter for general adult medical examination without abnormal findings: Secondary | ICD-10-CM

## 2020-02-11 NOTE — Progress Notes (Signed)
Virtual Visit via Telephone Note  I connected with  William York on 02/11/20 at  8:45 AM EST by telephone and verified that I am speaking with the correct person using two identifiers.  Location: Patient: Home Provider: Office Persons participating in the virtual visit: patient/Nurse Health Advisor   I discussed the limitations, risks, security and privacy concerns of performing an evaluation and management service by telephone and the availability of in person appointments. The patient expressed understanding and agreed to proceed.  Interactive audio and video telecommunications were attempted between this nurse and patient, however failed, due to patient having technical difficulties OR patient did not have access to video capability.  We continued and completed visit with audio only.  Some vital signs may be absent or patient reported.   Willette Brace, LPN    Subjective:   William York is a 80 y.o. male who presents for Medicare Annual/Subsequent preventive examination.  Review of Systems     Cardiac Risk Factors include: advanced age (>34men, >18 women);hypertension;dyslipidemia;male gender     Objective:    Today's Vitals   02/11/20 0843  PainSc: 0-No pain   There is no height or weight on file to calculate BMI.  Advanced Directives 02/11/2020 01/27/2020 01/21/2019 09/11/2017 09/05/2016 07/07/2014  Does Patient Have a Medical Advance Directive? Yes Yes Yes Yes Yes No  Type of Paramedic of [place];Living will [place];Living will [place];Living will - - -  Does patient want to make changes to medical advance directive? - No - Patient declined No - Patient declined - - -  Copy of [place] in Chart? No - copy requested No - copy requested No - copy requested - - -  Would patient like information on creating a medical advance directive? - - - - - No - patient declined information     Current Medications (verified) Outpatient Encounter Medications as of 02/11/2020  Medication Sig  . amLODipine (NORVASC) 2.5 MG tablet TAKE 1 TABLET BY MOUTH  DAILY  . aspirin EC 81 MG tablet Take 1 tablet (81 mg total) by mouth daily.  Marland Kitchen atorvastatin (LIPITOR) 20 MG tablet TAKE 1 TABLET BY MOUTH  DAILY  . losartan (COZAAR) 100 MG tablet TAKE 1 TABLET BY MOUTH  DAILY  . metoprolol succinate (TOPROL-XL) 50 MG 24 hr tablet TAKE 1 TABLET BY MOUTH  DAILY WITH OR IMMEDIATELY  FOLLOWING A MEAL  . nitroGLYCERIN (NITROSTAT) 0.4 MG SL tablet Place 1 tablet (0.4 mg total) under the tongue every 5 (five) minutes as needed.  . valACYclovir (VALTREX) 1000 MG tablet TAKE 2 TABLETS BY MOUTH  UPON ACUTE ONSET AND REPEAT DOSE IN 12 HOURS.  . [DISCONTINUED] HYDROcodone-acetaminophen (NORCO) 5-325 MG tablet 1-2 tabs po q6 hours prn pain (Patient not taking: Reported on 02/11/2020)   No facility-administered encounter medications on file as of 02/11/2020.    Allergies (verified) Patient has no known allergies.   History: Past Medical History:  Diagnosis Date  . Allergy   . CAD (coronary artery disease)   . ED (erectile dysfunction)   . Hyperlipidemia   . Hypertension   . Internal hemorrhoids   . OA (osteoarthritis)    Past Surgical History:  Procedure Laterality Date  . CORONARY ARTERY BYPASS GRAFT  June 1999  . HERNIA REPAIR  May 2006  . MASS EXCISION Right 01/27/2020   Procedure: RIGHT SMALL FINGER EXCISION MUCOID CYST;  Surgeon: Leanora Cover, MD;  Location:  [facility];  Service: Orthopedics;  Laterality: Right;  Bier block   Family History  Problem Relation Age of Onset  . Colon cancer Cousin        Mother side-First cousin  . Stroke Father    Social History   Socioeconomic History  . Marital status: Married    Spouse name: Not on file  . Number of children: 2  . Years of education: 18 years  . Highest education level: Master's degree (e.g., MA, MS, MEng, MEd, MSW, MBA)   Occupational History  . Occupation: Retired  Tobacco Use  . Smoking status: Never Smoker  . Smokeless tobacco: Never Used  Vaping Use  . Vaping Use: Never used  Substance and Sexual Activity  . Alcohol use: Yes    Comment: one daily   . Drug use: No  . Sexual activity: Not on file  Other Topics Concern  . Not on file  Social History Narrative   Married    [place] 2   1 son + 1 Daughter   5 grandchildren      Social Determinants of Health   Financial Resource Strain: Low Risk   . Difficulty of Paying Living Expenses: Not hard at all  Food Insecurity: No Food Insecurity  . Worried About Charity fundraiser in the Last Year: Never true  . Ran Out of Food in the Last Year: Never true  Transportation Needs: No Transportation Needs  . Lack of Transportation (Medical): No  . Lack of Transportation (Non-Medical): No  Physical Activity: Sufficiently Active  . Days of Exercise per Week: 5 days  . Minutes of Exercise per Session: 40 min  Stress: No Stress Concern Present  . Feeling of Stress : Not at all  Social Connections: Moderately Integrated  . Frequency of Communication with Friends and Family: Three times a week  . Frequency of Social Gatherings with Friends and Family: Once a week  . Attends Religious Services: More than 4 times per year  . Active Member of Clubs or Organizations: No  . Attends Archivist Meetings: Never  . Marital Status: Married    Tobacco Counseling Counseling given: Not Answered   Clinical Intake:  Pre-visit preparation completed: Yes  Pain : No/denies pain Pain Score: 0-No pain     BMI - recorded: 26.98 Nutritional Status: BMI 25 -29 Overweight Nutritional Risks: None Diabetes: No  How often do you need to have someone help you when you read instructions, pamphlets, or other written materials from your doctor or pharmacy?: 1 - Never  Diabetic?No  Interpreter Needed?: No  Information entered by :: Charlott Rakes,  LPN   Activities of Daily Living In your present state of health, do you have any difficulty performing the following activities: 02/11/2020 01/27/2020  Hearing? Y N  Comment wears hearing aids -  Vision? N N  Difficulty concentrating or making decisions? N N  Walking or climbing stairs? N N  Dressing or bathing? N N  Doing errands, shopping? N -  Preparing Food and eating ? N -  Using the Toilet? N -  In the past six months, have you accidently leaked urine? N -  Do you have problems with loss of bowel control? N -  Managing your Medications? N -  Managing your Finances? N -  Housekeeping or managing your Housekeeping? N -  Some recent data might be hidden    Patient Care Team: [place], [name], MD as PCP - General (Family  Medicine) Belva Crome, MD as PCP - Cardiology (Cardiology) Belva Crome, MD as Consulting Physician (Cardiology) Kathie Rhodes, MD (Inactive) as Consulting Physician (Urology) Melissa Noon, [place] as Referring Physician (Optometry)  Indicate any recent Medical Services you may have received from other than [facility] providers in the past year (date may be approximate).     Assessment:   This is a routine wellness examination for William York.  Hearing/Vision screen  Hearing Screening   125Hz  250Hz  500Hz  1000Hz  2000Hz  3000Hz  4000Hz  6000Hz  8000Hz   Right ear:           Left ear:           Comments: Pt wears hearing aids  Vision Screening Comments: Pt follows dr Delman Cheadle for annual eye exams  Dietary issues and exercise activities discussed: Current Exercise Habits: Home exercise routine, Type of exercise: treadmill;walking, Time (Minutes): 40, Frequency (Times/Week): 5, Weekly Exercise (Minutes/Week): 200  Goals    . Patient Stated     Maintain your current health and avoid covid    . Patient Stated     Continue exercise       Depression Screen PHQ 2/9 Scores 02/11/2020 01/23/2020 01/21/2019 01/07/2019 09/11/2017 09/11/2017 09/05/2016  PHQ - 2 Score 0 0 0 0 0 0 0     Fall Risk Fall Risk  02/11/2020 01/23/2020 01/21/2019 01/07/2019 09/11/2017  Falls in the past year? 0 0 0 0 No  Number falls in past yr: 0 - - 0 -  Injury with Fall? 0 - - 0 -  Risk for fall due to : Impaired vision - - - -  Follow up Falls prevention discussed - - Education provided -    FALL RISK PREVENTION PERTAINING TO THE HOME:  Any stairs in or around the home? Yes  If so, are there any without handrails? No  Home free of loose throw rugs in walkways, pet beds, electrical cords, etc? Yes  Adequate lighting in your home to reduce risk of falls? Yes   ASSISTIVE DEVICES UTILIZED TO PREVENT FALLS:  Life alert? No  Use of a cane, walker or w/c? No  Grab bars in the bathroom? Yes  Shower chair or bench in shower? No  Elevated toilet seat or a handicapped toilet? No   TIMED UP AND GO:  Was the test performed? No .     Cognitive Function: MMSE - Mini Mental State Exam 09/11/2017  Not completed: (No Data)     6CIT Screen 02/11/2020 01/21/2019  What Year? 0 points 0 points  What month? 0 points 0 points  Count back from 20 0 points 0 points  Months in reverse 0 points 0 points  Repeat phrase 0 points 0 points    Immunizations Immunization History  Administered Date(s) Administered  . Fluad Quad(high Dose 65+) 09/12/2018  . Influenza Split 09/18/2012  . Influenza Whole 11/01/2006, 10/09/2008, 09/23/2011  . Influenza, High Dose Seasonal PF 10/07/2013, 09/30/2017  . Influenza-Unspecified 10/04/2014, 10/05/2015, 09/20/2016, 09/18/2019  . PFIZER(Purple Top)SARS-COV-2 Vaccination 01/23/2019, 02/11/2019, 10/10/2019  . Pneumococcal Conjugate-13 10/07/2013  . Pneumococcal Polysaccharide-23 01/09/1997, 07/01/2004, 08/15/2012  . Td 06/26/2006  . Tdap 07/21/2010  . Zoster 09/23/2012  . Zoster Recombinat (Shingrix) 10/18/2018, 12/20/2018    TDAP status: Up to date  Flu Vaccine status: Up to date  Pneumococcal vaccine status: Up to date  Covid-19 vaccine status: Completed  vaccines  Qualifies for Shingles Vaccine? Yes   Zostavax completed Yes   Shingrix Completed?: Yes  Screening Tests Health Maintenance  Topic  Date Due  . Hepatitis C Screening  Never done  . COVID-19 Vaccine (4 - Booster for Pfizer series) 04/09/2020  . TETANUS/TDAP  07/20/2020  . INFLUENZA VACCINE  Completed  . PNA vac Low Risk Adult  Completed    Health Maintenance  Health Maintenance Due  Topic Date Due  . Hepatitis C Screening  Never done    Colorectal cancer screening: No longer required.   Additional Screening:  Hepatitis C Screening: does qualify  Vision Screening: Recommended annual ophthalmology exams for early detection of glaucoma and other disorders of the eye. Is the patient up to date with their annual eye exam?  Yes  Who is the provider or what is the name of the office in which the patient attends annual eye exams? Dr Delman Cheadle    Dental Screening: Recommended annual dental exams for proper oral hygiene  Community Resource Referral / Chronic Care Management: CRR required this visit?  No   CCM required this visit?  No      Plan:     I have personally reviewed and noted the following in the patient's chart:   . Medical and social history . Use of alcohol, tobacco or illicit drugs  . Current medications and supplements . Functional ability and status . Nutritional status . Physical activity . Advanced directives . List of other physicians . Hospitalizations, surgeries, and ER visits in previous 12 months . Vitals . Screenings to include cognitive, depression, and falls . Referrals and appointments  In addition, I have reviewed and discussed with patient certain preventive protocols, quality metrics, and best practice recommendations. A written personalized care plan for preventive services as well as general preventive health recommendations were provided to patient.     Willette Brace, LPN   [id number]   Nurse Notes: None

## 2020-02-11 NOTE — Patient Instructions (Addendum)
[name] , Thank you for taking time to come for your Medicare Wellness Visit. I appreciate your ongoing commitment to your health goals. Please review the following plan we discussed and let me know if I can assist you in the future.   Screening recommendations/referrals: Colonoscopy: No longer required  Recommended yearly ophthalmology/optometry visit for glaucoma screening and checkup Recommended yearly dental visit for hygiene and checkup  Vaccinations: Influenza vaccine: Done 09/18/19 Pneumococcal vaccine: Up to date Tdap vaccine: Up to date Shingles vaccine: Completed 10/18/18 & 12/20/18   Covid-19: Completed 1/14, 2/2, & 10/10/19  Advanced directives: Please bring a copy of your health care power of attorney and living will to the office at your convenience.  Conditions/risks identified: Continue exercising   Next appointment: Follow up in one year for your annual wellness visit.   Preventive Care 79 Years and Older, Male Preventive care refers to lifestyle choices and visits with your health care provider that can promote health and wellness. What does preventive care include?  A yearly physical exam. This is also called an annual well check.  Dental exams once or twice a year.  Routine eye exams. Ask your health care provider how often you should have your eyes checked.  Personal lifestyle choices, including:  Daily care of your teeth and gums.  Regular physical activity.  Eating a healthy diet.  Avoiding tobacco and drug use.  Limiting alcohol use.  Practicing safe sex.  Taking low doses of aspirin every day.  Taking vitamin and mineral supplements as recommended by your health care provider. What happens during an annual well check? The services and screenings done by your health care provider during your annual well check will depend on your age, overall health, lifestyle risk factors, and family history of disease. Counseling  Your health care provider may  ask you questions about your:  Alcohol use.  Tobacco use.  Drug use.  Emotional well-being.  Home and relationship well-being.  Sexual activity.  Eating habits.  History of falls.  Memory and ability to understand (cognition).  Work and work Statistician. Screening  You may have the following tests or measurements:  Height, weight, and BMI.  Blood pressure.  Lipid and cholesterol levels. These may be checked every 5 years, or more frequently if you are over 109 years old.  Skin check.  Lung cancer screening. You may have this screening every year starting at age 3 if you have a 30-pack-year history of smoking and currently smoke or have quit within the past 15 years.  Fecal occult blood test (FOBT) of the stool. You may have this test every year starting at age 41.  Flexible sigmoidoscopy or colonoscopy. You may have a sigmoidoscopy every 5 years or a colonoscopy every 10 years starting at age 63.  Prostate cancer screening. Recommendations will vary depending on your family history and other risks.  Hepatitis C blood test.  Hepatitis B blood test.  Sexually transmitted disease (STD) testing.  Diabetes screening. This is done by checking your blood sugar (glucose) after you have not eaten for a while (fasting). You may have this done every 1-3 years.  Abdominal aortic aneurysm (AAA) screening. You may need this if you are a current or former smoker.  Osteoporosis. You may be screened starting at age 60 if you are at high risk. Talk with your health care provider about your test results, treatment options, and if necessary, the need for more tests. Vaccines  Your health care provider may recommend  certain vaccines, such as:  Influenza vaccine. This is recommended every year.  Tetanus, diphtheria, and acellular pertussis (Tdap, Td) vaccine. You may need a Td booster every 10 years.  Zoster vaccine. You may need this after age 70.  Pneumococcal 13-valent  conjugate (PCV13) vaccine. One dose is recommended after age 67.  Pneumococcal polysaccharide (PPSV23) vaccine. One dose is recommended after age 46. Talk to your health care provider about which screenings and vaccines you need and how often you need them. This information is not intended to replace advice given to you by your health care provider. Make sure you discuss any questions you have with your health care provider. Document Released: 01/22/2015 Document Revised: 09/15/2015 Document Reviewed: 10/27/2014 Elsevier Interactive Patient Education  2017 [employer] Prevention in the Home Falls can cause injuries. They can happen to people of all ages. There are many things you can do to make your home safe and to help prevent falls. What can I do on the outside of my home?  Regularly fix the edges of walkways and driveways and fix any cracks.  Remove anything that might make you trip as you walk through a door, such as a raised step or threshold.  Trim any bushes or trees on the path to your home.  Use bright outdoor lighting.  Clear any walking paths of anything that might make someone trip, such as rocks or tools.  Regularly check to see if handrails are loose or broken. Make sure that both sides of any steps have handrails.  Any raised decks and porches should have guardrails on the edges.  Have any leaves, snow, or ice cleared regularly.  Use sand or salt on walking paths during winter.  Clean up any spills in your garage right away. This includes oil or grease spills. What can I do in the bathroom?  Use night lights.  Install grab bars by the toilet and in the tub and shower. Do not use towel bars as grab bars.  Use non-skid mats or decals in the tub or shower.  If you need to sit down in the shower, use a plastic, non-slip stool.  Keep the floor dry. Clean up any water that spills on the floor as soon as it happens.  Remove soap buildup in the tub or  shower regularly.  Attach bath mats securely with double-sided non-slip rug tape.  Do not have throw rugs and other things on the floor that can make you trip. What can I do in the bedroom?  Use night lights.  Make sure that you have a light by your bed that is easy to reach.  Do not use any sheets or blankets that are too big for your bed. They should not hang down onto the floor.  Have a firm chair that has side arms. You can use this for support while you get dressed.  Do not have throw rugs and other things on the floor that can make you trip. What can I do in the kitchen?  Clean up any spills right away.  Avoid walking on wet floors.  Keep items that you use a lot in easy-to-reach places.  If you need to reach something above you, use a strong step stool that has a grab bar.  Keep electrical cords out of the way.  Do not use floor polish or wax that makes floors slippery. If you must use wax, use non-skid floor wax.  Do not have throw rugs and other  things on the floor that can make you trip. What can I do with my stairs?  Do not leave any items on the stairs.  Make sure that there are handrails on both sides of the stairs and use them. Fix handrails that are broken or loose. Make sure that handrails are as long as the stairways.  Check any carpeting to make sure that it is firmly attached to the stairs. Fix any carpet that is loose or worn.  Avoid having throw rugs at the top or bottom of the stairs. If you do have throw rugs, attach them to the floor with carpet tape.  Make sure that you have a light switch at the top of the stairs and the bottom of the stairs. If you do not have them, ask someone to add them for you. What else can I do to help prevent falls?  Wear shoes that:  Do not have high heels.  Have rubber bottoms.  Are comfortable and fit you well.  Are closed at the toe. Do not wear sandals.  If you use a stepladder:  Make sure that it is fully  opened. Do not climb a closed stepladder.  Make sure that both sides of the stepladder are locked into place.  Ask someone to hold it for you, if possible.  Clearly mark and make sure that you can see:  Any grab bars or handrails.  First and last steps.  Where the edge of each step is.  Use tools that help you move around (mobility aids) if they are needed. These include:  Canes.  Walkers.  Scooters.  Crutches.  Turn on the lights when you go into a dark area. Replace any light bulbs as soon as they burn out.  Set up your furniture so you have a clear path. Avoid moving your furniture around.  If any of your floors are uneven, fix them.  If there are any pets around you, be aware of where they are.  Review your medicines with your doctor. Some medicines can make you feel dizzy. This can increase your chance of falling. Ask your doctor what other things that you can do to help prevent falls. This information is not intended to replace advice given to you by your health care provider. Make sure you discuss any questions you have with your health care provider. Document Released: 10/22/2008 Document Revised: 06/03/2015 Document Reviewed: 01/30/2014 Elsevier Interactive Patient Education  2017 [employer].

## 2020-02-23 DIAGNOSIS — H00012 Hordeolum externum right lower eyelid: Secondary | ICD-10-CM | POA: Diagnosis not present

## 2020-04-07 ENCOUNTER — Encounter: Payer: Self-pay | Admitting: Family Medicine

## 2020-05-22 ENCOUNTER — Other Ambulatory Visit: Payer: Self-pay | Admitting: Family Medicine

## 2020-05-22 DIAGNOSIS — E7800 Pure hypercholesterolemia, unspecified: Secondary | ICD-10-CM

## 2020-05-24 DIAGNOSIS — Z85828 Personal history of other malignant neoplasm of skin: Secondary | ICD-10-CM | POA: Diagnosis not present

## 2020-05-24 DIAGNOSIS — L570 Actinic keratosis: Secondary | ICD-10-CM | POA: Diagnosis not present

## 2020-05-24 DIAGNOSIS — L821 Other seborrheic keratosis: Secondary | ICD-10-CM | POA: Diagnosis not present

## 2020-06-08 ENCOUNTER — Other Ambulatory Visit: Payer: Self-pay | Admitting: Family Medicine

## 2020-06-08 DIAGNOSIS — B001 Herpesviral vesicular dermatitis: Secondary | ICD-10-CM

## 2020-06-16 ENCOUNTER — Other Ambulatory Visit: Payer: Self-pay

## 2020-06-16 ENCOUNTER — Ambulatory Visit (HOSPITAL_COMMUNITY)
Admission: EM | Admit: 2020-06-16 | Discharge: 2020-06-16 | Disposition: A | Discharge status: Home / Self Care | Payer: Medicare Other | Attending: Family Medicine | Admitting: Family Medicine

## 2020-06-16 ENCOUNTER — Telehealth: Payer: Medicare Other | Admitting: Family Medicine

## 2020-06-16 ENCOUNTER — Encounter (HOSPITAL_COMMUNITY): Payer: Self-pay

## 2020-06-16 DIAGNOSIS — J020 Streptococcal pharyngitis: Secondary | ICD-10-CM | POA: Diagnosis not present

## 2020-06-16 DIAGNOSIS — Z20822 Contact with and (suspected) exposure to covid-19: Secondary | ICD-10-CM | POA: Diagnosis not present

## 2020-06-16 LAB — POCT RAPID STREP A, ED / UC: Streptococcus, Group A Screen (Direct): NEGATIVE

## 2020-06-16 MED ORDER — LIDOCAINE VISCOUS HCL 2 % MT SOLN: 5.0000 mL | Freq: Four times a day (QID) | OROMUCOSAL | 0 refills | Status: DC | PRN

## 2020-06-16 NOTE — ED Provider Notes (Signed)
[facility]    CSN: [id number] Arrival date & time: 06/16/20  0841      History   Chief Complaint Chief Complaint  Patient presents with  . Sore Throat    HPI William York is a 80 y.o. male.   HPI   Sore Throat: Patient reports that he has had a sore throat since Sunday night. Feels like when he gets flares of sore in his mouth which he gets once per year. He normally takes valtrex for this but was out of town and was not able to take them and is now past his window. He reports that the sore throat has gotten progressively worse over the last few days.  He does have a low-grade fever in office today but has not had any at home that he is aware of. No vomiting, rash or significant abdominal pain. He has tried advil for symptoms with mild relief. No known sick contacts. Of note he did take a home COVID-19 test which was negative.   Past Medical History:  Diagnosis Date  . Allergy   . CAD (coronary artery disease)   . ED (erectile dysfunction)   . Hyperlipidemia   . Hypertension   . Internal hemorrhoids   . OA (osteoarthritis)     Patient Active Problem List   Diagnosis Date Noted  . Hearing loss, bilateral 09/05/2016  . Recurrent herpes labialis 08/23/2015  . Actinic keratosis 08/15/2011  . CAD (coronary artery disease) 07/08/2007  . OSTEOARTHRITIS 07/08/2007  . HYPERCHOLESTEROLEMIA 04/13/2007  . ERECTILE DYSFUNCTION 10/04/2006  . Essential hypertension 10/04/2006  . Allergic rhinitis 10/04/2006  . INTERNAL HEMORRHOIDS 07/15/2004    Past Surgical History:  Procedure Laterality Date  . CORONARY ARTERY BYPASS GRAFT  June 1999  . HERNIA REPAIR  May 2006  . MASS EXCISION Right 01/27/2020   Procedure: RIGHT SMALL FINGER EXCISION MUCOID CYST;  Surgeon: Leanora Cover, MD;  Location: [facility];  Service: Orthopedics;  Laterality: Right;  Bier block       Home Medications    Prior to Admission medications   Medication Sig Start Date End  Date Taking? Authorizing Provider  amLODipine (NORVASC) 2.5 MG tablet TAKE 1 TABLET BY MOUTH  DAILY 02/06/20   [place], [name], MD  aspirin EC 81 MG tablet Take 1 tablet (81 mg total) by mouth daily. 10/26/16   Belva Crome, MD  atorvastatin (LIPITOR) 20 MG tablet TAKE 1 TABLET BY MOUTH  DAILY 05/24/20   [place], [name], MD  losartan (COZAAR) 100 MG tablet TAKE 1 TABLET BY MOUTH  DAILY 11/21/19   [place], [name], MD  metoprolol succinate (TOPROL-XL) 50 MG 24 hr tablet TAKE 1 TABLET BY MOUTH  DAILY WITH OR IMMEDIATELY  FOLLOWING A MEAL 01/19/20   [place], [name], MD  nitroGLYCERIN (NITROSTAT) 0.4 MG SL tablet Place 1 tablet (0.4 mg total) under the tongue every 5 (five) minutes as needed. 01/23/20   [place], [name], MD  valACYclovir (VALTREX) 1000 MG tablet TAKE 2 TABLETS BY MOUTH  UPON ACUTE ONSET AND REPEAT DOSE IN 12 HOURS. 06/08/20   [place], [name], MD    Family History Family History  Problem Relation Age of Onset  . Colon cancer Cousin        Mother side-First cousin  . Stroke Father     Social History Social History   Tobacco Use  . Smoking status: Never Smoker  . Smokeless tobacco: Never Used  Vaping Use  .  Vaping Use: Never used  Substance Use Topics  . Alcohol use: Yes    Comment: one daily   . Drug use: No     Allergies   Patient has no known allergies.   Review of Systems Review of Systems  As stated above in HPI Physical Exam Triage Vital Signs ED Triage Vitals  Enc Vitals Group     BP 06/16/20 0913 (!) 183/67     Pulse Rate 06/16/20 0913 70     Resp 06/16/20 0913 19     Temp 06/16/20 0913 99.1 F (37.3 C)     Temp src --      SpO2 06/16/20 0913 96 %     Weight --      Height --      Head Circumference --      Peak Flow --      Pain Score 06/16/20 0911 9     Pain Loc --      Pain Edu? --      Excl. in [place]? --    No data found.  Updated Vital Signs BP (!) 183/67   Pulse 70   Temp 99.1 F (37.3 C)   Resp 19   SpO2 96%   Physical  Exam Vitals and nursing note reviewed.  Constitutional:      General: He is not in acute distress.    Appearance: He is well-developed. He is not ill-appearing, toxic-appearing or diaphoretic.  HENT:     Head: Normocephalic and atraumatic.     Right Ear: Tympanic membrane and ear canal normal. No drainage, swelling or tenderness. No middle ear effusion. Tympanic membrane is not erythematous.     Left Ear: Tympanic membrane and ear canal normal. No drainage, swelling or tenderness.  No middle ear effusion. Tympanic membrane is not erythematous.     Nose: No congestion or rhinorrhea.     Mouth/Throat:     Mouth: Mucous membranes are moist.     Pharynx: Oropharynx is clear. Uvula midline. Posterior oropharyngeal erythema present. No pharyngeal swelling, oropharyngeal exudate or uvula swelling.     Tonsils: No tonsillar exudate or tonsillar abscesses.  Eyes:     Conjunctiva/sclera: Conjunctivae normal.     Pupils: Pupils are equal, round, and reactive to light.  Cardiovascular:     Rate and Rhythm: Normal rate and regular rhythm.     Heart sounds: Normal heart sounds.  Pulmonary:     Effort: Pulmonary effort is normal.     Breath sounds: Normal breath sounds.  Abdominal:     Palpations: Abdomen is soft.  Musculoskeletal:     Cervical back: Normal range of motion and neck supple.  Lymphadenopathy:     Cervical: No cervical adenopathy.  Skin:    General: Skin is warm.     Findings: No rash.  Neurological:     Mental Status: He is alert and oriented to person, place, and time.      UC Treatments / Results  Labs (all labs ordered are listed, but only abnormal results are displayed) Labs Reviewed  POCT RAPID STREP A, ED / UC    EKG   Radiology No results found.  Procedures Procedures (including critical care time)  Medications Ordered in UC Medications - No data to display  Initial Impression / Assessment and Plan / UC Course  I have reviewed the triage vital signs  and the nursing notes.  Pertinent labs & imaging results that were available during my care of the  patient were reviewed by me and considered in my medical decision making (see chart for details).     New. Treating with magic mouthwash which I discussed. Discussed red flag signs and symptoms and if symptoms do not improve he should see ENT. He is agreeable.    Final Clinical Impressions(s) / UC Diagnoses   Final diagnoses:  None   Discharge Instructions   None    ED Prescriptions    None     PDMP not reviewed this encounter.   Hughie Closs, [place] 06/16/20 1001

## 2020-06-16 NOTE — ED Triage Notes (Signed)
Pt presents with complaints of sore throat that started Sunday night. Reports it has progressively gotten worse over the last few days. Pt denies fever.

## 2020-06-18 LAB — CULTURE, GROUP A STREP (THRC)

## 2020-08-18 DIAGNOSIS — H00015 Hordeolum externum left lower eyelid: Secondary | ICD-10-CM | POA: Diagnosis not present

## 2020-08-18 DIAGNOSIS — H1032 Unspecified acute conjunctivitis, left eye: Secondary | ICD-10-CM | POA: Diagnosis not present

## 2020-09-17 ENCOUNTER — Encounter: Payer: Self-pay | Admitting: Family Medicine

## 2020-09-18 ENCOUNTER — Emergency Department (HOSPITAL_COMMUNITY): Payer: Medicare Other

## 2020-09-18 ENCOUNTER — Other Ambulatory Visit: Payer: Self-pay

## 2020-09-18 ENCOUNTER — Emergency Department (HOSPITAL_COMMUNITY)
Admission: EM | Admit: 2020-09-18 | Discharge: 2020-09-18 | Disposition: A | Discharge status: Home / Self Care | Payer: Medicare Other | Attending: Emergency Medicine | Admitting: Emergency Medicine

## 2020-09-18 ENCOUNTER — Encounter (HOSPITAL_COMMUNITY): Payer: Self-pay

## 2020-09-18 DIAGNOSIS — R059 Cough, unspecified: Secondary | ICD-10-CM | POA: Diagnosis not present

## 2020-09-18 DIAGNOSIS — I10 Essential (primary) hypertension: Secondary | ICD-10-CM | POA: Diagnosis not present

## 2020-09-18 DIAGNOSIS — R509 Fever, unspecified: Secondary | ICD-10-CM | POA: Diagnosis present

## 2020-09-18 DIAGNOSIS — I2510 Atherosclerotic heart disease of native coronary artery without angina pectoris: Secondary | ICD-10-CM | POA: Insufficient documentation

## 2020-09-18 DIAGNOSIS — Z951 Presence of aortocoronary bypass graft: Secondary | ICD-10-CM | POA: Diagnosis not present

## 2020-09-18 DIAGNOSIS — U071 COVID-19: Secondary | ICD-10-CM | POA: Diagnosis not present

## 2020-09-18 DIAGNOSIS — J439 Emphysema, unspecified: Secondary | ICD-10-CM | POA: Diagnosis not present

## 2020-09-18 LAB — BASIC METABOLIC PANEL
Anion gap: 6 (ref 5–15)
BUN: 19 mg/dL (ref 8–23)
CO2: 21 mmol/L — ABNORMAL LOW (ref 22–32)
Calcium: 8.9 mg/dL (ref 8.9–10.3)
Chloride: 110 mmol/L (ref 98–111)
Creatinine, Ser: 0.95 mg/dL (ref 0.61–1.24)
GFR, Estimated: >60 mL/min (ref >60)
Glucose, Bld: 117 mg/dL — ABNORMAL HIGH (ref 70–99)
Potassium: 4 mmol/L (ref 3.5–5.1)
Sodium: 137 mmol/L (ref 135–145)

## 2020-09-18 LAB — CBC WITH DIFFERENTIAL/PLATELET
Abs Immature Granulocytes: 0.06 10*3/uL (ref 0.00–0.07)
Basophils Absolute: 0 10*3/uL (ref 0.0–0.1)
Basophils Relative: 0 %
Eosinophils Absolute: 0.1 10*3/uL (ref 0.0–0.5)
Eosinophils Relative: 1 %
HCT: 43 % (ref 39.0–52.0)
Hemoglobin: 14.7 g/dL (ref 13.0–17.0)
Immature Granulocytes: 1 %
Lymphocytes Relative: 9 %
Lymphs Abs: 0.6 10*3/uL — ABNORMAL LOW (ref 0.7–4.0)
MCH: 32.7 pg (ref 26.0–34.0)
MCHC: 34.2 g/dL (ref 30.0–36.0)
MCV: 95.8 fL (ref 80.0–100.0)
Monocytes Absolute: 0.8 10*3/uL (ref 0.1–1.0)
Monocytes Relative: 14 %
Neutro Abs: 4.5 10*3/uL (ref 1.7–7.7)
Neutrophils Relative %: 75 %
Platelets: 194 10*3/uL (ref 150–400)
RBC: 4.49 MIL/uL (ref 4.22–5.81)
RDW: 12.4 % (ref 11.5–15.5)
WBC: 6.1 10*3/uL (ref 4.0–10.5)
nRBC: 0 % (ref 0.0–0.2)

## 2020-09-18 LAB — RESP PANEL BY RT-PCR (FLU A&B, COVID) ARPGX2
Influenza A by PCR: NEGATIVE
Influenza B by PCR: NEGATIVE
SARS Coronavirus 2 by RT PCR: POSITIVE — AB

## 2020-09-18 MED ORDER — NIRMATRELVIR/RITONAVIR (PAXLOVID)TABLET: 3.0000 | ORAL_TABLET | Freq: Two times a day (BID) | ORAL | 0 refills | Status: AC

## 2020-09-18 NOTE — Discharge Instructions (Signed)
You were diagnosed with COVID 19. I am starting you on Paxlovid. Please take as directed and follow with your PCP by phone in the coming week. I have attached information on quarantine and home management of symptoms. Return to the ED with any new or suddenly worsening symptoms.

## 2020-09-18 NOTE — ED Provider Notes (Signed)
Emergency Medicine Provider Triage Evaluation Note  William York , a 80 y.o. male  was evaluated in triage.  Pt complains of chills, mild cough and some congestion that began yesterday evening he developed a fever overnight denies any chest pain shortness of breath nausea vomiting diarrhea.  Denies any history of kidney issues. Tested positive for COVID at home Review of Systems  Positive: Congestion, fever Negative: Chest pain, SOB  Physical Exam  BP (!) 164/65 (BP Location: Left Arm)   Pulse 83   Temp 99.5 F (37.5 C) (Oral)   Resp 18   Ht '5\' 10"'$  (1.778 m)   Wt 82.6 kg   SpO2 96%   BMI 26.11 kg/m  Gen:   Awake, no distress   Resp:  Normal effort  MSK:   Moves extremities without difficulty  Other:    Medical Decision Making  Medically screening exam initiated at 8:28 AM.  Appropriate orders placed.  [name] was informed that the remainder of the evaluation will be completed by another provider, this initial triage assessment does not replace that evaluation, and the importance of remaining in the ED until their evaluation is complete.  Well-appearing 80 year old male tested positive for COVID with home test.  We will confirm but COVID-positive here obtain baisc labs to confirm normal kidney function.   Pati Gallo [place], [place] 09/18/20 0830    Godfrey Pick, MD 09/19/20 [phone]

## 2020-09-18 NOTE — ED Provider Notes (Signed)
Emergency Department Provider Note   I have reviewed the triage vital signs and the nursing notes.   HISTORY  Chief Complaint Covid Positive   HPI William York is a 80 y.o. male with past medical history reviewed below presents to the emergency department with mild COVID-19 symptoms.  Patient is fully vaccinated and boosted.  2 days ago he developed fever with mild cough which is mostly resolved.  He is not having chest pain or shortness of breath.  No abdominal pain, diarrhea, confusion, dehydration symptoms.  He lives at home with his wife is not experiencing any symptoms.  He took a home COVID test which came back positive then presents for consideration of antiviral medications.   Past Medical History:  Diagnosis Date   Allergy    CAD (coronary artery disease)    ED (erectile dysfunction)    Hyperlipidemia    Hypertension    Internal hemorrhoids    OA (osteoarthritis)     Patient Active Problem List   Diagnosis Date Noted   Hearing loss, bilateral 09/05/2016   Recurrent herpes labialis 08/23/2015   Actinic keratosis 08/15/2011   CAD (coronary artery disease) 07/08/2007   OSTEOARTHRITIS 07/08/2007   HYPERCHOLESTEROLEMIA 04/13/2007   ERECTILE DYSFUNCTION 10/04/2006   Essential hypertension 10/04/2006   Allergic rhinitis 10/04/2006   INTERNAL HEMORRHOIDS 07/15/2004    Past Surgical History:  Procedure Laterality Date   CORONARY ARTERY BYPASS GRAFT  June 1999   HERNIA REPAIR  May 2006   MASS EXCISION Right 01/27/2020   Procedure: RIGHT SMALL FINGER EXCISION MUCOID CYST;  Surgeon: Leanora Cover, MD;  Location: [facility];  Service: Orthopedics;  Laterality: Right;  Bier block    Allergies Patient has no known allergies.  Family History  Problem Relation Age of Onset   Colon cancer Cousin        Mother side-First cousin   Stroke Father     Social History Social History   Tobacco Use   Smoking status: Never   Smokeless tobacco: Never   Vaping Use   Vaping Use: Never used  Substance Use Topics   Alcohol use: Yes    Comment: one daily    Drug use: No    Review of Systems  Constitutional: Intermittent fever.  Eyes: No visual changes. ENT: No sore throat. Cardiovascular: Denies chest pain. Respiratory: Denies shortness of breath. Mild cough.  Gastrointestinal: No abdominal pain.  No nausea, no vomiting.  No diarrhea.  No constipation. Genitourinary: Negative for dysuria. Musculoskeletal: Negative for back pain. Skin: Negative for rash. Neurological: Negative for headaches, focal weakness or numbness.  10-point ROS otherwise negative.  ____________________________________________   PHYSICAL EXAM:  VITAL SIGNS: ED Triage Vitals  Enc Vitals Group     BP 09/18/20 0809 (!) 164/65     Pulse Rate 09/18/20 0809 83     Resp 09/18/20 0809 18     Temp 09/18/20 0809 99.5 F (37.5 C)     Temp Source 09/18/20 0809 Oral     SpO2 09/18/20 0809 96 %     Weight 09/18/20 0815 182 lb (82.6 kg)     Height 09/18/20 0815 '5\' 10"'$  (1.778 m)   Constitutional: Alert and oriented. Well appearing and in no acute distress. Eyes: Conjunctivae are normal. Head: Atraumatic. Nose: No congestion/rhinnorhea. Mouth/Throat: Mucous membranes are moist.  Neck: No stridor.  Cardiovascular: Normal rate, regular rhythm. Good peripheral circulation. Grossly normal heart sounds.   Respiratory: Normal respiratory effort.  No retractions. Lungs  CTAB. Gastrointestinal: Soft and nontender. No distention.  Musculoskeletal: No lower extremity tenderness nor edema. No gross deformities of extremities. Neurologic:  Normal speech and language. No gross focal neurologic deficits are appreciated.  Skin:  Skin is warm, dry and intact. No rash noted.  ____________________________________________   LABS (all labs ordered are listed, but only abnormal results are displayed)  Labs Reviewed  RESP PANEL BY RT-PCR (FLU A&B, COVID) ARPGX2 - Abnormal;  Notable for the following components:      Result Value   SARS Coronavirus 2 by RT PCR POSITIVE (*)    All other components within normal limits  BASIC METABOLIC PANEL - Abnormal; Notable for the following components:   CO2 21 (*)    Glucose, Bld 117 (*)    All other components within normal limits  CBC WITH DIFFERENTIAL/PLATELET - Abnormal; Notable for the following components:   Lymphs Abs 0.6 (*)    All other components within normal limits   ____________________________________________  RADIOLOGY  DG Chest 2 View  Result Date: 09/18/2020 CLINICAL DATA:  80 year old male with cough EXAM: CHEST - 2 VIEW COMPARISON:  None. FINDINGS: Cardiomediastinal silhouette within normal limits in size and contour. No evidence of central vascular congestion. No interlobular septal thickening. Surgical changes of median sternotomy and CABG. Stigmata of emphysema, with increased retrosternal airspace, flattened hemidiaphragms, increased AP diameter, and hyperinflation on the AP view. No pneumothorax or pleural effusion. Coarsened interstitial markings, with no confluent airspace disease. No acute displaced fracture. Degenerative changes of the spine. IMPRESSION: Emphysema without definite evidence of acute cardiopulmonary disease. Surgical changes of median sternotomy and CABG Electronically Signed   By: Corrie Mckusick D.O.   On: 09/18/2020 09:15    ____________________________________________   PROCEDURES  Procedure(s) performed:   Procedures  None  ____________________________________________   INITIAL IMPRESSION / ASSESSMENT AND PLAN / ED COURSE  Pertinent labs & imaging results that were available during my care of the patient were reviewed by me and considered in my medical decision making (see chart for details).   Patient presents emergency department with COVID-19 symptoms which are mild in the setting of a positive home COVID test.  He is very well-appearing and in no respiratory  distress.  EKG performed during the MSE process shows emphysema but no acute cardiopulmonary disease.  Patient does have several risk factors which put him at higher risk for more severe COVID.  He is vaccinated and boosted.  No evidence to suggest community-acquired/bacterial pneumonia.  Patient's lab work from rectal screening shows GFR greater than 60.  No significant electrolyte disturbance.  COVID-positive on PCR here.  We discussed risk/benefit of Paxlovid and this medication was sent to his pharmacy.  He will follow-up with his PCP by phone in the coming week.  Discussed quarantine and home management of COVID symptoms. Discussed ED return precautions.   William York was evaluated in Emergency Department on 09/18/2020 for the symptoms described in the history of present illness. He was evaluated in the context of the global COVID-19 pandemic, which necessitated consideration that the patient might be at risk for infection with the SARS-CoV-2 virus that causes COVID-19. Institutional protocols and algorithms that pertain to the evaluation of patients at risk for COVID-19 are in a state of rapid change based on information released by regulatory bodies including the CDC and federal and state organizations. These policies and algorithms were followed during the patient's care in the ED.  ____________________________________________  FINAL CLINICAL IMPRESSION(S) / ED DIAGNOSES  Final  diagnoses:  COVID-19    NEW OUTPATIENT MEDICATIONS STARTED DURING THIS VISIT:  New Prescriptions   NIRMATRELVIR/RITONAVIR EUA (PAXLOVID) 20 X 150 MG & 10 X '100MG'$  TABS    Take 3 tablets by mouth 2 (two) times daily for 5 days. Patient GFR is > 60. Take nirmatrelvir (150 mg) two tablets twice daily for 5 days and ritonavir (100 mg) one tablet twice daily for 5 days.    Note:  This document was prepared using Dragon voice recognition software and may include unintentional dictation errors.  Nanda Quinton, MD,  [facility] Emergency Medicine    Jesi Jurgens, [facility], MD 09/18/20 1229

## 2020-09-18 NOTE — ED Triage Notes (Signed)
Patient arrives for Covid treatment. Patient states that he tested positive this morning and he's been having a fever and some dizziness throughout the night. No shortness of breath, diarrhea, or vomiting. States that he would like a medication to treat covid.

## 2020-09-18 NOTE — ED Notes (Signed)
Dr Long at bedside

## 2020-10-22 ENCOUNTER — Other Ambulatory Visit: Payer: Self-pay | Admitting: Family Medicine

## 2020-11-02 ENCOUNTER — Encounter: Payer: Self-pay | Admitting: Family Medicine

## 2020-11-15 DIAGNOSIS — H10412 Chronic giant papillary conjunctivitis, left eye: Secondary | ICD-10-CM | POA: Diagnosis not present

## 2020-11-15 DIAGNOSIS — H0100B Unspecified blepharitis left eye, upper and lower eyelids: Secondary | ICD-10-CM | POA: Diagnosis not present

## 2020-11-23 ENCOUNTER — Ambulatory Visit: Payer: Medicare Other | Admitting: Interventional Cardiology

## 2020-11-23 ENCOUNTER — Encounter: Payer: Self-pay | Admitting: Interventional Cardiology

## 2020-11-23 ENCOUNTER — Other Ambulatory Visit: Payer: Self-pay

## 2020-11-23 VITALS — BP 158/92 | HR 67 | Ht 70.0 in | Wt 193.2 lb

## 2020-11-23 DIAGNOSIS — E785 Hyperlipidemia, unspecified: Secondary | ICD-10-CM

## 2020-11-23 DIAGNOSIS — N5201 Erectile dysfunction due to arterial insufficiency: Secondary | ICD-10-CM

## 2020-11-23 DIAGNOSIS — I25810 Atherosclerosis of coronary artery bypass graft(s) without angina pectoris: Secondary | ICD-10-CM | POA: Diagnosis not present

## 2020-11-23 DIAGNOSIS — I452 Bifascicular block: Secondary | ICD-10-CM | POA: Diagnosis not present

## 2020-11-23 DIAGNOSIS — I10 Essential (primary) hypertension: Secondary | ICD-10-CM

## 2020-11-23 MED ORDER — AMLODIPINE BESYLATE 5 MG PO TABS: 5.0000 mg | ORAL_TABLET | Freq: Every day | ORAL | 3 refills | Status: DC

## 2020-11-23 NOTE — Progress Notes (Signed)
Cardiology Office Note:    Date:  11/23/2020   ID:  William York, DOB July 01, 1940, MRN [id number]  PCP:  Martinique, [name], MD  Cardiologist:  Sinclair Grooms, MD   Referring MD: Martinique, [name], MD   Chief Complaint  Patient presents with   Coronary Artery Disease   Hypertension    History of Present Illness:    William York is a 80 y.o. male with a hx of hyperlipidemia, CAD with prior 5 vessel bypass surgery 1999 (grafts unknown- Dr. Gilford Raid), hypertension, erectile dysfunction, and osteoarthritis.   He has no complaints.  He has a drink each evening.  He denies chest pain.  He denies orthopnea, PND, and dyspnea on exertion.  No peripheral edema.  No episodes of syncope or near syncope.  Past Medical History:  Diagnosis Date   Allergy    CAD (coronary artery disease)    ED (erectile dysfunction)    Hyperlipidemia    Hypertension    Internal hemorrhoids    OA (osteoarthritis)     Past Surgical History:  Procedure Laterality Date   CORONARY ARTERY BYPASS GRAFT  June 1999   HERNIA REPAIR  May 2006   MASS EXCISION Right 01/27/2020   Procedure: RIGHT SMALL FINGER EXCISION MUCOID CYST;  Surgeon: Leanora Cover, MD;  Location: [place];  Service: Orthopedics;  Laterality: Right;  Bier block    Current Medications: Current Meds  Medication Sig   amLODipine (NORVASC) 5 MG tablet Take 1 tablet (5 mg total) by mouth daily.   aspirin EC 81 MG tablet Take 1 tablet (81 mg total) by mouth daily.   atorvastatin (LIPITOR) 20 MG tablet TAKE 1 TABLET BY MOUTH  DAILY   losartan (COZAAR) 100 MG tablet TAKE 1 TABLET BY MOUTH  DAILY   magic mouthwash (lidocaine, diphenhydrAMINE, alum & mag hydroxide) suspension Swish and swallow 5 mLs 4 (four) times daily as needed for mouth pain.   metoprolol succinate (TOPROL-XL) 50 MG 24 hr tablet TAKE 1 TABLET BY MOUTH  DAILY WITH OR IMMEDIATELY  FOLLOWING A MEAL   neomycin-polymyxin b-dexamethasone (MAXITROL) 3.5-10000-0.1  SUSP Place 1 drop into the left eye in the morning and at bedtime.   nitroGLYCERIN (NITROSTAT) 0.4 MG SL tablet Place 1 tablet (0.4 mg total) under the tongue every 5 (five) minutes as needed.   valACYclovir (VALTREX) 1000 MG tablet TAKE 2 TABLETS BY MOUTH  UPON ACUTE ONSET AND REPEAT DOSE IN 12 HOURS.   [DISCONTINUED] amLODipine (NORVASC) 2.5 MG tablet TAKE 1 TABLET BY MOUTH  DAILY     Allergies:   Patient has no known allergies.   Social History   Socioeconomic History   Marital status: Married    Spouse name: Not on file   Number of children: 2   Years of education: 18 years   Highest education level: Master's degree (e.g., MA, MS, MEng, MEd, MSW, MBA)  Occupational History   Occupation: Retired  Tobacco Use   Smoking status: Never   Smokeless tobacco: Never  Vaping Use   Vaping Use: Never used  Substance and Sexual Activity   Alcohol use: Yes    Comment: one daily    Drug use: No   Sexual activity: Not on file  Other Topics Concern   Not on file  Social History Narrative   Married    [place] 2   1 son + 1 Daughter   5 grandchildren      Social Determinants of  Health   Financial Resource Strain: Low Risk    Difficulty of Paying Living Expenses: Not hard at all  Food Insecurity: No Food Insecurity   Worried About [place] in the Last Year: Never true   Ran Out of Food in the Last Year: Never true  Transportation Needs: No Transportation Needs   Lack of Transportation (Medical): No   Lack of Transportation (Non-Medical): No  Physical Activity: Sufficiently Active   Days of Exercise per Week: 5 days   Minutes of Exercise per Session: 40 min  Stress: No Stress Concern Present   Feeling of Stress : Not at all  Social Connections: Moderately Integrated   Frequency of Communication with Friends and Family: Three times a week   Frequency of Social Gatherings with Friends and Family: Once a week   Attends Religious Services: More than 4 times per year   Active  Member of [employer] or Organizations: No   Attends Archivist Meetings: Never   Marital Status: Married     Family History: The patient's family history includes Colon cancer in his cousin; Stroke in his father.  ROS:   Please see the history of present illness.    No neurological complaints.  Has cut back somewhat on his physical activity.  His weight is been increasing.  He has occasional leg cramps.  All other systems reviewed and are negative.  EKGs/Labs/Other Studies Reviewed:    The following studies were reviewed today: No new or recent imaging.  EKG:  EKG normal sinus rhythm, left anterior hemiblock, right bundle branch block, PR interval 174 ms.  Evidence of conduction abnormality notable when compared to 11/26/2019, noted.  Recent Labs: 09/18/2020: BUN 19; Creatinine, Ser 0.95; Hemoglobin 14.7; Platelets 194; Potassium 4.0; Sodium 137  Recent Lipid Panel    Component Value Date/Time   CHOL 132 11/28/2019 0721   TRIG 89 11/28/2019 0721   HDL 55 11/28/2019 0721   CHOLHDL 2.4 11/28/2019 0721   CHOLHDL 3 01/07/2019 0716   VLDL 20.4 01/07/2019 0716   LDLCALC 60 11/28/2019 0721    Physical Exam:    VS:  BP (!) 158/92   Pulse 67   Ht 5\' 10"  (1.778 m)   Wt 193 lb 3.2 oz (87.6 kg)   SpO2 96%   BMI 27.72 kg/m     Wt Readings from Last 3 Encounters:  11/23/20 193 lb 3.2 oz (87.6 kg)  09/18/20 182 lb (82.6 kg)  01/27/20 188 lb 0.8 oz (85.3 kg)     GEN: Weight is up 5 pounds since January.  We discussed trying to get his weight back into the low 180s.. No acute distress HEENT: Normal NECK: No JVD. LYMPHATICS: No lymphadenopathy CARDIAC: No murmur. RRR no gallop, or edema. VASCULAR:  Normal Pulses. No bruits. RESPIRATORY:  Clear to auscultation without rales, wheezing or rhonchi  ABDOMEN: Soft, non-tender, non-distended, No pulsatile mass, MUSCULOSKELETAL: No deformity  SKIN: Warm and dry NEUROLOGIC:  Alert and oriented x 3 PSYCHIATRIC:  Normal affect    ASSESSMENT:    1. Essential hypertension   2. Atherosclerosis of CABG w/o angina pectoris   3. Hyperlipidemia LDL goal <70   4. Block, bifascicular   5. Erectile dysfunction due to arterial insufficiency    PLAN:    In order of problems listed above:  Increase amlodipine to 5 mg/day; decrease sodium in diet to less than 3 g/day.  Cut back on p.m. alcohol intake.  Lose weight and increase physical activity.  Record blood pressures at home Secondary prevention discussed Continue Lipitor 20 mg/day.  Liver and lipid panel will be done within the next 2 months fasting Unchanged. Not discussed  Target BP: <130/80 mmHg  Diet and lifestyle measures for BP control were reviewed in detail: Low sodium diet (<2.5 gm daily); alcohol restriction (<3 ounces per day); weight loss (Mediterranean); avoid non-steroidal agents; > 6 hours sleep per day; 150 min moderate exercise per week. Medical regimen will include at least 2 agents. Resistant hypertension if not controlled on 3 agents. Consider further evaluation: Sleep study to r/o OSA; Renal angiogram; Primary hyperaldonism and Pheochromocytoma w/u. After 3 agents, consider MRA (spironolactone)/ Epleronone), hydralazine, beta-blocker, and Minoxidil if not already in use due to patient profile.  1 year follow-up if not sooner   Medication Adjustments/Labs and Tests Ordered: Current medicines are reviewed at length with the patient today.  Concerns regarding medicines are outlined above.  Orders Placed This Encounter  Procedures   EKG 12-Lead   Meds ordered this encounter  Medications   amLODipine (NORVASC) 5 MG tablet    Sig: Take 1 tablet (5 mg total) by mouth daily.    Dispense:  90 tablet    Refill:  3    Dose change    Patient Instructions  Medication Instructions:  1) INCREASE Amlodipine to 5mg  once daily.  Monitor your blood pressure 2-3 times per week.  Make sure it has been at least 2 hours since you have taken your  medications.  Let us know if blood pressure remains elevated.  *If you need a refill on your cardiac medications before your next appointment, please call your pharmacy*   Lab Work: None If you have labs (blood work) drawn today and your tests are completely normal, you will receive your results only by: [place] (if you have MyChart) OR A paper copy in the mail If you have any lab test that is abnormal or we need to change your treatment, we will call you to review the results.   Testing/Procedures: None   Follow-Up: At [facility], you and your health needs are our priority.  As part of our continuing mission to provide you with exceptional heart care, we have created designated Provider Care Teams.  These Care Teams include your primary Cardiologist (physician) and Advanced Practice Providers (APPs -  Physician Assistants and Nurse Practitioners) who all work together to provide you with the care you need, when you need it.  We recommend signing up for the patient portal called "MyChart".  Sign up information is provided on this After Visit Summary.  MyChart is used to connect with patients for Virtual Visits (Telemedicine).  Patients are able to view lab/test results, encounter notes, upcoming appointments, etc.  Non-urgent messages can be sent to your provider as well.   To learn more about what you can do with MyChart, go to [email or web address].    Your next appointment:   1 year(s)  The format for your next appointment:   In Person  Provider:   Sinclair Grooms, MD     Other Instructions     Signed, Sinclair Grooms, MD  11/23/2020 4:05 PM    [place]

## 2020-11-23 NOTE — Patient Instructions (Addendum)
Medication Instructions:  1) INCREASE Amlodipine to 5mg  once daily.  Monitor your blood pressure 2-3 times per week.  Make sure it has been at least 2 hours since you have taken your medications.  Let us know if blood pressure remains elevated.  *If you need a refill on your cardiac medications before your next appointment, please call your pharmacy*   Lab Work: Liver and Lipid anytime in the next two months.  You will need to be fasting for these labs (nothing to eat or drink after midnight except water and black coffee).  If you have labs (blood work) drawn today and your tests are completely normal, you will receive your results only by: [place] (if you have MyChart) OR A paper copy in the mail If you have any lab test that is abnormal or we need to change your treatment, we will call you to review the results.   Testing/Procedures: None   Follow-Up: At [facility], you and your health needs are our priority.  As part of our continuing mission to provide you with exceptional heart care, we have created designated Provider Care Teams.  These Care Teams include your primary Cardiologist (physician) and Advanced Practice Providers (APPs -  Physician Assistants and Nurse Practitioners) who all work together to provide you with the care you need, when you need it.  We recommend signing up for the patient portal called "MyChart".  Sign up information is provided on this After Visit Summary.  MyChart is used to connect with patients for Virtual Visits (Telemedicine).  Patients are able to view lab/test results, encounter notes, upcoming appointments, etc.  Non-urgent messages can be sent to your provider as well.   To learn more about what you can do with MyChart, go to [email or web address].    Your next appointment:   1 year(s)  The format for your next appointment:   In Person  Provider:   Sinclair Grooms, MD     Other Instructions

## 2020-11-23 NOTE — Addendum Note (Signed)
Addended by: Loren Racer on: 11/23/2020 04:12 PM   Modules accepted: Orders

## 2020-11-29 ENCOUNTER — Other Ambulatory Visit: Payer: Self-pay

## 2020-11-29 ENCOUNTER — Other Ambulatory Visit: Payer: Medicare Other | Admitting: *Deleted

## 2020-11-29 DIAGNOSIS — E785 Hyperlipidemia, unspecified: Secondary | ICD-10-CM

## 2020-11-30 LAB — LIPID PANEL
Chol/HDL Ratio: 2.4 ratio (ref 0.0–5.0)
Cholesterol, Total: 146 mg/dL (ref 100–199)
HDL: 61 mg/dL (ref >39)
LDL Chol Calc (NIH): 66 mg/dL (ref 0–99)
Triglycerides: 103 mg/dL (ref 0–149)
VLDL Cholesterol Cal: 19 mg/dL (ref 5–40)

## 2020-11-30 LAB — HEPATIC FUNCTION PANEL
ALT: 24 IU/L (ref 0–44)
AST: 20 IU/L (ref 0–40)
Albumin: 4.6 g/dL (ref 3.7–4.7)
Alkaline Phosphatase: 91 IU/L (ref 44–121)
Bilirubin Total: 0.7 mg/dL (ref 0.0–1.2)
Bilirubin, Direct: 0.19 mg/dL (ref 0.00–0.40)
Total Protein: 7 g/dL (ref 6.0–8.5)

## 2020-12-06 ENCOUNTER — Ambulatory Visit: Payer: Medicare Other | Admitting: Interventional Cardiology

## 2020-12-18 ENCOUNTER — Other Ambulatory Visit: Payer: Self-pay | Admitting: Family Medicine

## 2020-12-18 DIAGNOSIS — I10 Essential (primary) hypertension: Secondary | ICD-10-CM

## 2020-12-24 DIAGNOSIS — H353121 Nonexudative age-related macular degeneration, left eye, early dry stage: Secondary | ICD-10-CM | POA: Diagnosis not present

## 2020-12-24 DIAGNOSIS — H25813 Combined forms of age-related cataract, bilateral: Secondary | ICD-10-CM | POA: Diagnosis not present

## 2020-12-24 DIAGNOSIS — H52203 Unspecified astigmatism, bilateral: Secondary | ICD-10-CM | POA: Diagnosis not present

## 2021-02-11 DIAGNOSIS — H2513 Age-related nuclear cataract, bilateral: Secondary | ICD-10-CM | POA: Diagnosis not present

## 2021-02-11 DIAGNOSIS — H353121 Nonexudative age-related macular degeneration, left eye, early dry stage: Secondary | ICD-10-CM | POA: Diagnosis not present

## 2021-02-11 DIAGNOSIS — H43813 Vitreous degeneration, bilateral: Secondary | ICD-10-CM | POA: Diagnosis not present

## 2021-02-11 DIAGNOSIS — H25041 Posterior subcapsular polar age-related cataract, right eye: Secondary | ICD-10-CM | POA: Diagnosis not present

## 2021-02-16 ENCOUNTER — Ambulatory Visit (INDEPENDENT_AMBULATORY_CARE_PROVIDER_SITE_OTHER): Payer: Medicare Other

## 2021-02-16 ENCOUNTER — Ambulatory Visit: Payer: Medicare Other

## 2021-02-16 VITALS — Ht 70.0 in | Wt 185.0 lb

## 2021-02-16 DIAGNOSIS — Z0000 Encounter for general adult medical examination without abnormal findings: Secondary | ICD-10-CM | POA: Diagnosis not present

## 2021-02-16 NOTE — Progress Notes (Signed)
Subjective:   William York is a 81 y.o. male who presents for Medicare Annual/Subsequent preventive examination.  Review of Systems    Virtual Visit via Telephone Note  I connected with  William York on 02/16/21 at  9:00 AM EST by telephone and verified that I am speaking with the correct person using two identifiers.  Location: Patient: Home Provider: Office Persons participating in the virtual visit: patient/Nurse Health Advisor   I discussed the limitations, risks, security and privacy concerns of performing an evaluation and management service by telephone and the availability of in person appointments. The patient expressed understanding and agreed to proceed.  Interactive audio and video telecommunications were attempted between this nurse and patient, however failed, due to patient having technical difficulties OR patient did not have access to video capability.  We continued and completed visit with audio only.  Some vital signs may be absent or patient reported.   [name], LPN  Cardiac Risk Factors include: advanced age (>73men, >63 women);hypertension;male gender     Objective:    Today's Vitals   02/16/21 0852  Weight: 185 lb (83.9 kg)  Height: 5\' 10"  (1.778 m)   Body mass index is 26.54 kg/m.  Advanced Directives 02/16/2021 09/18/2020 02/11/2020 01/27/2020 01/21/2019 09/11/2017 09/05/2016  Does Patient Have a Medical Advance Directive? Yes No Yes Yes Yes Yes Yes  Type of Paramedic of [place];Living will - [place];Living will [place];Living will [place];Living will - -  Does patient want to make changes to medical advance directive? No - Patient declined - - No - Patient declined No - Patient declined - -  Copy of [place] in Chart? No - copy requested - No - copy requested No - copy requested No - copy requested - -  Would patient like information on  creating a medical advance directive? - No - Patient declined - - - - -    Current Medications (verified) Outpatient Encounter Medications as of 02/16/2021  Medication Sig   amLODipine (NORVASC) 5 MG tablet Take 1 tablet (5 mg total) by mouth daily.   aspirin EC 81 MG tablet Take 1 tablet (81 mg total) by mouth daily.   atorvastatin (LIPITOR) 20 MG tablet TAKE 1 TABLET BY MOUTH  DAILY   losartan (COZAAR) 100 MG tablet TAKE 1 TABLET BY MOUTH  DAILY   magic mouthwash (lidocaine, diphenhydrAMINE, alum & mag hydroxide) suspension Swish and swallow 5 mLs 4 (four) times daily as needed for mouth pain.   metoprolol succinate (TOPROL-XL) 50 MG 24 hr tablet TAKE 1 TABLET BY MOUTH  DAILY WITH OR IMMEDIATELY  FOLLOWING A MEAL   nitroGLYCERIN (NITROSTAT) 0.4 MG SL tablet Place 1 tablet (0.4 mg total) under the tongue every 5 (five) minutes as needed.   valACYclovir (VALTREX) 1000 MG tablet TAKE 2 TABLETS BY MOUTH  UPON ACUTE ONSET AND REPEAT DOSE IN 12 HOURS.   [DISCONTINUED] neomycin-polymyxin b-dexamethasone (MAXITROL) 3.5-10000-0.1 SUSP Place 1 drop into the left eye in the morning and at bedtime.   No facility-administered encounter medications on file as of 02/16/2021.    Allergies (verified) Patient has no known allergies.   History: Past Medical History:  Diagnosis Date   Allergy    CAD (coronary artery disease)    ED (erectile dysfunction)    Hyperlipidemia    Hypertension    Internal hemorrhoids    OA (osteoarthritis)    Past Surgical  History:  Procedure Laterality Date   CORONARY ARTERY BYPASS GRAFT  June 1999   HERNIA REPAIR  May 2006   MASS EXCISION Right 01/27/2020   Procedure: RIGHT SMALL FINGER EXCISION MUCOID CYST;  Surgeon: Leanora Cover, MD;  Location: [place];  Service: Orthopedics;  Laterality: Right;  Bier block   Family History  Problem Relation Age of Onset   Colon cancer Cousin        Mother side-First cousin   Stroke Father    Social History    Socioeconomic History   Marital status: Married    Spouse name: Not on file   Number of children: 2   Years of education: 18 years   Highest education level: Master's degree (e.g., MA, MS, MEng, MEd, MSW, MBA)  Occupational History   Occupation: Retired  Tobacco Use   Smoking status: Never   Smokeless tobacco: Never  Vaping Use   Vaping Use: Never used  Substance and Sexual Activity   Alcohol use: Yes    Comment: one daily    Drug use: No   Sexual activity: Not on file  Other Topics Concern   Not on file  Social History Narrative   Married    [place] 2   1 son + 1 Daughter   5 grandchildren      Social Determinants of Health   Financial Resource Strain: Low Risk    Difficulty of Paying Living Expenses: Not hard at all  Food Insecurity: No Food Insecurity   Worried About Charity fundraiser in the Last Year: Never true   Arboriculturist in the Last Year: Never true  Transportation Needs: No Transportation Needs   Lack of Transportation (Medical): No   Lack of Transportation (Non-Medical): No  Physical Activity: Sufficiently Active   Days of Exercise per Week: 5 days   Minutes of Exercise per Session: 40 min  Stress: No Stress Concern Present   Feeling of Stress : Not at all  Social Connections: Socially Integrated   Frequency of Communication with Friends and Family: More than three times a week   Frequency of Social Gatherings with Friends and Family: More than three times a week   Attends Religious Services: More than 4 times per year   Active Member of [employer] or Organizations: Yes   Attends Music therapist: More than 4 times per year   Marital Status: Married     Clinical Intake:  Pre-visit preparation completed: Yes  Pain : No/denies pain     BMI - recorded: 27.72 Nutritional Status: BMI 25 -29 Overweight Nutritional Risks: None Diabetes: No  How often do you need to have someone help you when you read instructions, pamphlets, or other  written materials from your doctor or pharmacy?: 1 - Never  Diabetic? No  Interpreter Needed?: No Activities of Daily Living In your present state of health, do you have any difficulty performing the following activities: 02/16/2021  Hearing? N  Vision? N  Difficulty concentrating or making decisions? N  Walking or climbing stairs? N  Dressing or bathing? N  Doing errands, shopping? N  Preparing Food and eating ? N  Using the Toilet? N  In the past six months, have you accidently leaked urine? N  Do you have problems with loss of bowel control? N  Managing your Medications? N  Managing your Finances? N  Housekeeping or managing your Housekeeping? N  Some recent data might be hidden    Patient  Care Team: [place], [name], MD as PCP - General (Family Medicine) Belva Crome, MD as PCP - Cardiology (Cardiology) Belva Crome, MD as Consulting Physician (Cardiology) Kathie Rhodes, MD (Inactive) as Consulting Physician (Urology) Melissa Noon, [place] as Referring Physician (Optometry)  Indicate any recent Medical Services you may have received from other than [facility] providers in the past year (date may be approximate).     Assessment:   This is a routine wellness examination for William York.  Hearing/Vision screen Hearing Screening - Comments:: Wears hearing aids Vision Screening - Comments:: Wears gasses. Followed by Dr Delman Cheadle  Dietary issues and exercise activities discussed: Current Exercise Habits: Home exercise routine, Type of exercise: walking, Time (Minutes): 40, Frequency (Times/Week): 4, Weekly Exercise (Minutes/Week): 160, Intensity: Moderate, Exercise limited by: None identified   Goals Addressed             This Visit's Progress    Patient Stated       Maintain your current health and avoid covid.       Depression Screen PHQ 2/9 Scores 02/16/2021 02/11/2020 01/23/2020 01/21/2019 01/07/2019 09/11/2017 09/11/2017  PHQ - 2 Score 0 0 0 0 0 0 0    Fall Risk Fall Risk  02/16/2021  02/15/2021 02/11/2020 01/23/2020 01/21/2019  Falls in the past year? 0 0 0 0 0  Number falls in past yr: 0 - 0 - -  Injury with Fall? 0 - 0 - -  Risk for fall due to : No Fall Risks - Impaired vision - -  Follow up - - Falls prevention discussed - -    FALL RISK PREVENTION PERTAINING TO THE HOME:  Any stairs in or around the home? Yes  If so, are there any without handrails? No  Home free of loose throw rugs in walkways, pet beds, electrical cords, etc? Yes  Adequate lighting in your home to reduce risk of falls? Yes   ASSISTIVE DEVICES UTILIZED TO PREVENT FALLS:  Life alert? No  Use of a cane, walker or w/c? No  Grab bars in the bathroom? Yes  Shower chair or bench in shower? Yes  Elevated toilet seat or a handicapped toilet? Yes   TIMED UP AND GO:  Was the test performed? No . Audio Visit  Cognitive Function: MMSE - Mini Mental State Exam 09/11/2017  Not completed: (No Data)     6CIT Screen 02/16/2021 02/11/2020 01/21/2019  What Year? 0 points 0 points 0 points  What month? 0 points 0 points 0 points  What time? 0 points - -  Count back from 20 0 points 0 points 0 points  Months in reverse 0 points 0 points 0 points  Repeat phrase 0 points 0 points 0 points  Total Score 0 - -    Immunizations Immunization History  Administered Date(s) Administered   Fluad Quad(high Dose 65+) 09/12/2018   Influenza Split 09/18/2012   Influenza Whole 11/01/2006, 10/09/2008, 09/23/2011   Influenza, High Dose Seasonal PF 10/07/2013, 09/30/2017   Influenza-Unspecified 10/04/2014, 10/05/2015, 09/20/2016, 09/18/2019, 10/19/2020   PFIZER(Purple Top)SARS-COV-2 Vaccination 01/23/2019, 02/11/2019, 10/10/2019, 11/17/2020   Pneumococcal Conjugate-13 10/07/2013   Pneumococcal Polysaccharide-23 01/09/1997, 07/01/2004, 08/15/2012   Td 06/26/2006   Tdap 07/21/2010   Zoster Recombinat (Shingrix) 10/18/2018, 12/20/2018   Zoster, Live 09/23/2012    TDAP status: Due, Education has been provided regarding  the importance of this vaccine. Advised may receive this vaccine at local pharmacy or Health Dept. Aware to provide a copy of the vaccination record if obtained from  local pharmacy or Health Dept. Verbalized acceptance and understanding.  Flu Vaccine status: Up to date  Pneumococcal vaccine status: Up to date  Covid-19 vaccine status: Completed vaccines  Qualifies for Shingles Vaccine? Yes   Zostavax completed Yes   Shingrix Completed?: Yes  Screening Tests Health Maintenance  Topic Date Due   COVID-19 Vaccine (5 - Booster for Pfizer series) 01/12/2021   TETANUS/TDAP  02/16/2022 (Originally 07/20/2020)   Pneumonia Vaccine 110+ Years old  Completed   INFLUENZA VACCINE  Completed   Zoster Vaccines- Shingrix  Completed   HPV VACCINES  Aged Out    Health Maintenance  Health Maintenance Due  Topic Date Due   COVID-19 Vaccine (5 - Booster for [place] series) 01/12/2021    Colorectal cancer screening: No longer required.   Additional Screening:  Hepatitis C Screening: does not qualify.  Vision Screening: Recommended annual ophthalmology exams for early detection of glaucoma and other disorders of the eye. Is the patient up to date with their annual eye exam?  Yes  Who is the provider or what is the name of the office in which the patient attends annual eye exams? Dr Delman Cheadle If pt is not established with a provider, would they like to be referred to a provider to establish care? No .   Dental Screening: Recommended annual dental exams for proper oral hygiene  Community Resource Referral / Chronic Care Management: CRR required this visit?  No   CCM required this visit?  No      Plan:     I have personally reviewed and noted the following in the patients chart:   Medical and social history Use of alcohol, tobacco or illicit drugs  Current medications and supplements including opioid prescriptions. Patient is not currently taking opioid prescriptions. Functional ability and  status Nutritional status Physical activity Advanced directives List of other physicians Hospitalizations, surgeries, and ER visits in previous 12 months Vitals Screenings to include cognitive, depression, and falls Referrals and appointments  In addition, I have reviewed and discussed with patient certain preventive protocols, quality metrics, and best practice recommendations. A written personalized care plan for preventive services as well as general preventive health recommendations were provided to patient.     [name], LPN   05/12/2704   Nurse Notes: None

## 2021-02-16 NOTE — Patient Instructions (Addendum)
William York , Thank you for taking time to come for your Medicare Wellness Visit. I appreciate your ongoing commitment to your health goals. Please review the following plan we discussed and let me know if I can assist you in the future.   These are the goals we discussed:  Goals      Patient Stated     Maintain your current health and avoid covid.     Patient Stated     Continue exercise         This is a list of the screening recommended for you and due dates:  Health Maintenance  Topic Date Due   COVID-19 Vaccine (5 - Booster for Pfizer series) 01/12/2021   Tetanus Vaccine  02/16/2022*   Pneumonia Vaccine  Completed   Flu Shot  Completed   Zoster (Shingles) Vaccine  Completed   HPV Vaccine  Aged Out  *Topic was postponed. The date shown is not the original due date.   Advanced directives: Yes Patient will bring copy  Conditions/risks identified: None  Next appointment: Follow up in one year for your annual wellness visit.   Preventive Care 45 Years and Older, Male Preventive care refers to lifestyle choices and visits with your health care provider that can promote health and wellness. What does preventive care include? A yearly physical exam. This is also called an annual well check. Dental exams once or twice a year. Routine eye exams. Ask your health care provider how often you should have your eyes checked. Personal lifestyle choices, including: Daily care of your teeth and gums. Regular physical activity. Eating a healthy diet. Avoiding tobacco and drug use. Limiting alcohol use. Practicing safe sex. Taking low doses of aspirin every day. Taking vitamin and mineral supplements as recommended by your health care provider. What happens during an annual well check? The services and screenings done by your health care provider during your annual well check will depend on your age, overall health, lifestyle risk factors, and family history of disease. Counseling   Your health care provider may ask you questions about your: Alcohol use. Tobacco use. Drug use. Emotional well-being. Home and relationship well-being. Sexual activity. Eating habits. History of falls. Memory and ability to understand (cognition). Work and work Statistician. Screening  You may have the following tests or measurements: Height, weight, and BMI. Blood pressure. Lipid and cholesterol levels. These may be checked every 5 years, or more frequently if you are over 38 years old. Skin check. Lung cancer screening. You may have this screening every year starting at age 82 if you have a 30-pack-year history of smoking and currently smoke or have quit within the past 15 years. Fecal occult blood test (FOBT) of the stool. You may have this test every year starting at age 66. Flexible sigmoidoscopy or colonoscopy. You may have a sigmoidoscopy every 5 years or a colonoscopy every 10 years starting at age 54. Prostate cancer screening. Recommendations will vary depending on your family history and other risks. Hepatitis C blood test. Hepatitis B blood test. Sexually transmitted disease (STD) testing. Diabetes screening. This is done by checking your blood sugar (glucose) after you have not eaten for a while (fasting). You may have this done every 1-3 years. Abdominal aortic aneurysm (AAA) screening. You may need this if you are a current or former smoker. Osteoporosis. You may be screened starting at age 88 if you are at high risk. Talk with your health care provider about your test results, treatment options,  and if necessary, the need for more tests. Vaccines  Your health care provider may recommend certain vaccines, such as: Influenza vaccine. This is recommended every year. Tetanus, diphtheria, and acellular pertussis (Tdap, Td) vaccine. You may need a Td booster every 10 years. Zoster vaccine. You may need this after age 84. Pneumococcal 13-valent conjugate (PCV13) vaccine.  One dose is recommended after age 49. Pneumococcal polysaccharide (PPSV23) vaccine. One dose is recommended after age 72. Talk to your health care provider about which screenings and vaccines you need and how often you need them. This information is not intended to replace advice given to you by your health care provider. Make sure you discuss any questions you have with your health care provider. Document Released: 01/22/2015 Document Revised: 09/15/2015 Document Reviewed: 10/27/2014 Elsevier Interactive Patient Education  2017 [place] Prevention in the Home Falls can cause injuries. They can happen to people of all ages. There are many things you can do to make your home safe and to help prevent falls. What can I do on the outside of my home? Regularly fix the edges of walkways and driveways and fix any cracks. Remove anything that might make you trip as you walk through a door, such as a raised step or threshold. Trim any bushes or trees on the path to your home. Use bright outdoor lighting. Clear any walking paths of anything that might make someone trip, such as rocks or tools. Regularly check to see if handrails are loose or broken. Make sure that both sides of any steps have handrails. Any raised decks and porches should have guardrails on the edges. Have any leaves, snow, or ice cleared regularly. Use sand or salt on walking paths during winter. Clean up any spills in your garage right away. This includes oil or grease spills. What can I do in the bathroom? Use night lights. Install grab bars by the toilet and in the tub and shower. Do not use towel bars as grab bars. Use non-skid mats or decals in the tub or shower. If you need to sit down in the shower, use a plastic, non-slip stool. Keep the floor dry. Clean up any water that spills on the floor as soon as it happens. Remove soap buildup in the tub or shower regularly. Attach bath mats securely with double-sided  non-slip rug tape. Do not have throw rugs and other things on the floor that can make you trip. What can I do in the bedroom? Use night lights. Make sure that you have a light by your bed that is easy to reach. Do not use any sheets or blankets that are too big for your bed. They should not hang down onto the floor. Have a firm chair that has side arms. You can use this for support while you get dressed. Do not have throw rugs and other things on the floor that can make you trip. What can I do in the kitchen? Clean up any spills right away. Avoid walking on wet floors. Keep items that you use a lot in easy-to-reach places. If you need to reach something above you, use a strong step stool that has a grab bar. Keep electrical cords out of the way. Do not use floor polish or wax that makes floors slippery. If you must use wax, use non-skid floor wax. Do not have throw rugs and other things on the floor that can make you trip. What can I do with my stairs? Do not leave  any items on the stairs. Make sure that there are handrails on both sides of the stairs and use them. Fix handrails that are broken or loose. Make sure that handrails are as long as the stairways. Check any carpeting to make sure that it is firmly attached to the stairs. Fix any carpet that is loose or worn. Avoid having throw rugs at the top or bottom of the stairs. If you do have throw rugs, attach them to the floor with carpet tape. Make sure that you have a light switch at the top of the stairs and the bottom of the stairs. If you do not have them, ask someone to add them for you. What else can I do to help prevent falls? Wear shoes that: Do not have high heels. Have rubber bottoms. Are comfortable and fit you well. Are closed at the toe. Do not wear sandals. If you use a stepladder: Make sure that it is fully opened. Do not climb a closed stepladder. Make sure that both sides of the stepladder are locked into place. Ask  someone to hold it for you, if possible. Clearly mark and make sure that you can see: Any grab bars or handrails. First and last steps. Where the edge of each step is. Use tools that help you move around (mobility aids) if they are needed. These include: Canes. Walkers. Scooters. Crutches. Turn on the lights when you go into a dark area. Replace any light bulbs as soon as they burn out. Set up your furniture so you have a clear path. Avoid moving your furniture around. If any of your floors are uneven, fix them. If there are any pets around you, be aware of where they are. Review your medicines with your doctor. Some medicines can make you feel dizzy. This can increase your chance of falling. Ask your doctor what other things that you can do to help prevent falls. This information is not intended to replace advice given to you by your health care provider. Make sure you discuss any questions you have with your health care provider. Document Released: 10/22/2008 Document Revised: 06/03/2015 Document Reviewed: 01/30/2014 Elsevier Interactive Patient Education  2017 [employer].

## 2021-02-24 DIAGNOSIS — H25011 Cortical age-related cataract, right eye: Secondary | ICD-10-CM | POA: Diagnosis not present

## 2021-02-24 DIAGNOSIS — H2511 Age-related nuclear cataract, right eye: Secondary | ICD-10-CM | POA: Diagnosis not present

## 2021-02-24 DIAGNOSIS — H25811 Combined forms of age-related cataract, right eye: Secondary | ICD-10-CM | POA: Diagnosis not present

## 2021-03-03 DIAGNOSIS — H25012 Cortical age-related cataract, left eye: Secondary | ICD-10-CM | POA: Diagnosis not present

## 2021-03-03 DIAGNOSIS — H25812 Combined forms of age-related cataract, left eye: Secondary | ICD-10-CM | POA: Diagnosis not present

## 2021-03-03 DIAGNOSIS — H2512 Age-related nuclear cataract, left eye: Secondary | ICD-10-CM | POA: Diagnosis not present

## 2021-03-10 DIAGNOSIS — H9113 Presbycusis, bilateral: Secondary | ICD-10-CM | POA: Diagnosis not present

## 2021-03-10 DIAGNOSIS — H903 Sensorineural hearing loss, bilateral: Secondary | ICD-10-CM | POA: Diagnosis not present

## 2021-04-23 ENCOUNTER — Other Ambulatory Visit: Payer: Self-pay | Admitting: Family Medicine

## 2021-04-23 DIAGNOSIS — E7800 Pure hypercholesterolemia, unspecified: Secondary | ICD-10-CM

## 2021-05-25 DIAGNOSIS — L821 Other seborrheic keratosis: Secondary | ICD-10-CM | POA: Diagnosis not present

## 2021-05-25 DIAGNOSIS — L812 Freckles: Secondary | ICD-10-CM | POA: Diagnosis not present

## 2021-05-25 DIAGNOSIS — L570 Actinic keratosis: Secondary | ICD-10-CM | POA: Diagnosis not present

## 2021-05-25 DIAGNOSIS — L820 Inflamed seborrheic keratosis: Secondary | ICD-10-CM | POA: Diagnosis not present

## 2021-05-25 DIAGNOSIS — Z85828 Personal history of other malignant neoplasm of skin: Secondary | ICD-10-CM | POA: Diagnosis not present

## 2021-06-02 ENCOUNTER — Other Ambulatory Visit: Payer: Self-pay | Admitting: Family Medicine

## 2021-06-02 DIAGNOSIS — I10 Essential (primary) hypertension: Secondary | ICD-10-CM

## 2021-07-04 ENCOUNTER — Ambulatory Visit: Payer: Medicare Other | Admitting: Podiatry

## 2021-07-04 ENCOUNTER — Ambulatory Visit (INDEPENDENT_AMBULATORY_CARE_PROVIDER_SITE_OTHER): Payer: Medicare Other

## 2021-07-04 DIAGNOSIS — M205X1 Other deformities of toe(s) (acquired), right foot: Secondary | ICD-10-CM

## 2021-07-04 NOTE — Progress Notes (Signed)
   HPI: 81 y.o. male presenting today as a new patient for evaluation of right great toe pain that is intermittent for the past few years.  Denies a history of injury.  On occasion he takes Advil.  The pain is very intermittent and mild.  Past Medical History:  Diagnosis Date   Allergy    CAD (coronary artery disease)    ED (erectile dysfunction)    Hyperlipidemia    Hypertension    Internal hemorrhoids    OA (osteoarthritis)     Past Surgical History:  Procedure Laterality Date   CORONARY ARTERY BYPASS GRAFT  June 1999   HERNIA REPAIR  May 2006   MASS EXCISION Right 01/27/2020   Procedure: RIGHT SMALL FINGER EXCISION MUCOID CYST;  Surgeon: Betha Loa, MD;  Location: [facility];  Service: Orthopedics;  Laterality: Right;  Bier block    No Known Allergies   Physical Exam: General: The patient is alert and oriented x3 in no acute distress.  Dermatology: Skin is warm, dry and supple bilateral lower extremities. Negative for open lesions or macerations.  Vascular: Palpable pedal pulses bilaterally. Capillary refill within normal limits.  Negative for any significant edema or erythema  Neurological: Light touch and protective threshold grossly intact  Musculoskeletal Exam: No pedal deformities noted.  Limited range of motion with some pain on palpation and range of motion with mild crepitus to the first MTP right  Radiographic Exam RT foot 07/04/2021:  Normal osseous mineralization. Joint spaces preserved with exception of the right great toe joint which demonstrates joint space narrowing and loss of joint integrity consistent with hallux limitus/DJD. No fracture/dislocation/boney destruction.    Assessment: 1.  Advanced hallux limitus/DJD right first MTP   Plan of Care:  1. Patient evaluated. X-Rays reviewed.  2.  Discussed the arthritic condition and different treatment options both conservative and surgical.  Patient states that the pain is only mild and  intermittent.  Recommend Advil as needed 3.  Return to clinic as needed      Felecia Shelling, DPM [facility]  Dr. Felecia Shelling, DPM    [address] [place], [place] [address]                Office [phone]  Fax [phone]

## 2021-07-13 ENCOUNTER — Other Ambulatory Visit: Payer: Self-pay | Admitting: Family Medicine

## 2021-07-13 ENCOUNTER — Other Ambulatory Visit: Payer: Self-pay | Admitting: Interventional Cardiology

## 2021-07-13 DIAGNOSIS — E7800 Pure hypercholesterolemia, unspecified: Secondary | ICD-10-CM

## 2021-07-17 ENCOUNTER — Other Ambulatory Visit: Payer: Self-pay | Admitting: Family Medicine

## 2021-07-17 DIAGNOSIS — I10 Essential (primary) hypertension: Secondary | ICD-10-CM

## 2021-10-03 ENCOUNTER — Other Ambulatory Visit: Payer: Self-pay | Admitting: Family Medicine

## 2021-10-03 DIAGNOSIS — E7800 Pure hypercholesterolemia, unspecified: Secondary | ICD-10-CM
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
## 2021-11-22 ENCOUNTER — Encounter: Payer: Self-pay | Admitting: Interventional Cardiology

## 2021-11-22 ENCOUNTER — Ambulatory Visit: Payer: Medicare Other | Attending: Interventional Cardiology | Admitting: Interventional Cardiology

## 2021-11-22 VITALS — BP 138/62 | HR 68 | Ht 70.0 in | Wt 191.2 lb

## 2021-11-22 DIAGNOSIS — N5201 Erectile dysfunction due to arterial insufficiency: Secondary | ICD-10-CM

## 2021-11-22 DIAGNOSIS — I452 Bifascicular block: Secondary | ICD-10-CM | POA: Diagnosis not present

## 2021-11-22 DIAGNOSIS — E785 Hyperlipidemia, unspecified: Secondary | ICD-10-CM | POA: Diagnosis not present

## 2021-11-22 DIAGNOSIS — I25810 Atherosclerosis of coronary artery bypass graft(s) without angina pectoris: Secondary | ICD-10-CM | POA: Diagnosis not present

## 2021-11-22 DIAGNOSIS — I10 Essential (primary) hypertension: Secondary | ICD-10-CM

## 2021-11-22 NOTE — Patient Instructions (Signed)
Medication Instructions:  Your physician recommends that you continue on your current medications as directed. Please refer to the Current Medication list given to you today.  *If you need a refill on your cardiac medications before your next appointment, please call your pharmacy*  Lab Work: NONE  Testing/Procedures: NONE  Follow-Up: At [facility], you and your health needs are our priority.  As part of our continuing mission to provide you with exceptional heart care, we have created designated Provider Care Teams.  These Care Teams include your primary Cardiologist (physician) and Advanced Practice Providers (APPs -  Physician Assistants and Nurse Practitioners) who all work together to provide you with the care you need, when you need it.  Your next appointment:   1 year(s)  The format for your next appointment:   In Person  Provider:   Candee Furbish, MD  Important Information About Sugar

## 2021-11-23 ENCOUNTER — Other Ambulatory Visit: Payer: Self-pay | Admitting: Family Medicine

## 2021-11-23 DIAGNOSIS — I10 Essential (primary) hypertension: Secondary | ICD-10-CM

## 2021-12-06 ENCOUNTER — Other Ambulatory Visit: Payer: Self-pay | Admitting: Family Medicine

## 2021-12-06 DIAGNOSIS — E7800 Pure hypercholesterolemia, unspecified: Secondary | ICD-10-CM

## 2021-12-09 DIAGNOSIS — H353131 Nonexudative age-related macular degeneration, bilateral, early dry stage: Secondary | ICD-10-CM | POA: Diagnosis not present

## 2021-12-09 DIAGNOSIS — H52203 Unspecified astigmatism, bilateral: Secondary | ICD-10-CM | POA: Diagnosis not present

## 2022-01-16 ENCOUNTER — Other Ambulatory Visit: Payer: Self-pay | Admitting: Interventional Cardiology

## 2022-01-20 ENCOUNTER — Other Ambulatory Visit: Payer: Self-pay | Admitting: Family Medicine

## 2022-01-20 DIAGNOSIS — E7800 Pure hypercholesterolemia, unspecified: Secondary | ICD-10-CM

## 2022-01-24 ENCOUNTER — Encounter: Payer: Self-pay | Admitting: Interventional Cardiology

## 2022-01-24 ENCOUNTER — Encounter: Payer: Self-pay | Admitting: Family Medicine

## 2022-01-24 MED ORDER — AMLODIPINE BESYLATE 5 MG PO TABS: 5.0000 mg | ORAL_TABLET | Freq: Every day | ORAL | 3 refills | Status: DC

## 2022-02-06 ENCOUNTER — Other Ambulatory Visit: Payer: Self-pay | Admitting: Family Medicine

## 2022-02-08 NOTE — Progress Notes (Unsigned)
HPI: Mr. ZEVEN KOCAK is a 82 y.o.male with PMHx significant for HLD,CAD,allergic rhinitis,OA,and hearing loss here today for his routine physical examination.  Last CPE: 01/23/20  He has not been exercising as regularly as he would like due to his wife's recent back surgery, but they are starting to get back into their routine. He reports maintaining a healthy diet, cooking at home, and consuming vegetables daily.  He sleeps an average of six hours per night.  He denies smoking and reports consuming one glass of wine per day.  Immunization History  Administered Date(s) Administered   Fluad Quad(high Dose 65+) 09/12/2018   Influenza Split 09/18/2012   Influenza Whole 11/01/2006, 10/09/2008, 09/23/2011   Influenza, High Dose Seasonal PF 10/07/2013, 09/30/2017   Influenza-Unspecified 10/04/2014, 10/05/2015, 09/20/2016, 09/18/2019, 10/19/2020, 09/30/2021   PFIZER(Purple Top)SARS-COV-2 Vaccination 01/23/2019, 02/11/2019, 10/10/2019, 11/17/2020   Pneumococcal Conjugate-13 10/07/2013   Pneumococcal Polysaccharide-23 01/09/1997, 07/01/2004, 08/15/2012   Td 06/26/2006   Tdap 07/21/2010   Unspecified SARS-COV-2 Vaccination 09/30/2021   Zoster Recombinat (Shingrix) 10/18/2018, 12/20/2018   Zoster, Live 09/23/2012    Health Maintenance  Topic Date Due   Medicare Annual Wellness (AWV)  02/16/2022   COVID-19 Vaccine (6 - 2023-24 season) 02/26/2022 (Originally 11/25/2021)   Pneumonia Vaccine 31+ Years old  Completed   INFLUENZA VACCINE  Completed   Zoster Vaccines- Shingrix  Completed   HPV VACCINES  Aged Out   DTaP/Tdap/Td  Discontinued   Since his last OV he has seen his cardiologist, last visit with Dr Tamala Julian 11/22/21. HLD on Atorvastatin 20 mg daily. Lab Results  Component Value Date   CHOL 146 11/29/2020   HDL 61 11/29/2020   LDLCALC 66 11/29/2020   TRIG 103 11/29/2020   CHOLHDL 2.4 11/29/2020   Hx of prediabetes, HgA1C was 6.0 and 6.1 3 and 5 years ago  respectively. Negative for polydipsia,polyuria, or polyphagia.  Lab Results  Component Value Date   HGBA1C 5.5 01/23/2020   HTN on Amlodipine 5 mg daily,Losartan 100 mg daily, and Metoprolol succinate 50 mg daily. Lab Results  Component Value Date   CREATININE 0.95 09/18/2020   BUN 19 09/18/2020   NA 137 09/18/2020   K 4.0 09/18/2020   CL 110 09/18/2020   CO2 21 (L) 09/18/2020   Review of Systems  Constitutional:  Negative for activity change, appetite change, fatigue and fever.  HENT:  Negative for nosebleeds and sore throat.   Eyes:  Negative for redness and visual disturbance.  Respiratory:  Negative for cough, shortness of breath and wheezing.   Cardiovascular:  Negative for chest pain, palpitations and leg swelling.  Gastrointestinal:  Negative for abdominal pain, blood in stool, nausea and vomiting.  Endocrine: Negative for cold intolerance and heat intolerance.  Genitourinary:  Negative for decreased urine volume, dysuria and hematuria.  Musculoskeletal:  Positive for arthralgias. Negative for gait problem.  Skin:  Negative for color change and rash.  Neurological:  Negative for syncope, weakness and headaches.  Psychiatric/Behavioral:  Negative for confusion. The patient is not nervous/anxious.   All other systems reviewed and are negative.  Current Outpatient Medications on File Prior to Visit  Medication Sig Dispense Refill   amLODipine (NORVASC) 5 MG tablet Take 1 tablet (5 mg total) by mouth daily. 90 tablet 3   aspirin EC 81 MG tablet Take 1 tablet (81 mg total) by mouth daily. 90 tablet 3   atorvastatin (LIPITOR) 20 MG tablet TAKE 1 TABLET BY MOUTH ONCE  DAILY 90 tablet 0  losartan (COZAAR) 100 MG tablet TAKE 1 TABLET BY MOUTH DAILY 90 tablet 0   metoprolol succinate (TOPROL-XL) 50 MG 24 hr tablet TAKE 1 TABLET BY MOUTH DAILY  WITH OR IMMEDIATELY FOLLOWING A  MEAL 90 tablet 0   nitroGLYCERIN (NITROSTAT) 0.4 MG SL tablet Place 1 tablet (0.4 mg total) under the  tongue every 5 (five) minutes as needed. 25 tablet 0   valACYclovir (VALTREX) 1000 MG tablet TAKE 2 TABLETS BY MOUTH  UPON ACUTE ONSET AND REPEAT DOSE IN 12 HOURS. 16 tablet 3   No current facility-administered medications on file prior to visit.   Past Medical History:  Diagnosis Date   Allergy    CAD (coronary artery disease)    ED (erectile dysfunction)    Hyperlipidemia    Hypertension    Internal hemorrhoids    OA (osteoarthritis)    Past Surgical History:  Procedure Laterality Date   CORONARY ARTERY BYPASS GRAFT  June 1999   HERNIA REPAIR  May 2006   MASS EXCISION Right 01/27/2020   Procedure: RIGHT SMALL FINGER EXCISION MUCOID CYST;  Surgeon: Leanora Cover, MD;  Location: [place];  Service: Orthopedics;  Laterality: Right;  Bier block   No Known Allergies  Family History  Problem Relation Age of Onset   Colon cancer Cousin        Mother side-First cousin   Stroke Father    Social History   Socioeconomic History   Marital status: Married    Spouse name: Not on file   Number of children: 2   Years of education: 18 years   Highest education level: Master's degree (e.g., MA, MS, MEng, MEd, MSW, MBA)  Occupational History   Occupation: Retired  Tobacco Use   Smoking status: Never   Smokeless tobacco: Never  Vaping Use   Vaping Use: Never used  Substance and Sexual Activity   Alcohol use: Yes    Comment: one daily    Drug use: No   Sexual activity: Not on file  Other Topics Concern   Not on file  Social History Narrative   Married    [place] 2   1 son + 1 Daughter   5 grandchildren      Social Determinants of Health   Financial Resource Strain: Low Risk  (02/16/2021)   Overall Financial Resource Strain (CARDIA)    Difficulty of Paying Living Expenses: Not hard at all  Food Insecurity: No Food Insecurity (02/16/2021)   Hunger Vital Sign    Worried About Running Out of Food in the Last Year: Never true    [place] in the Last Year: Never  true  Transportation Needs: No Transportation Needs (02/16/2021)   PRAPARE - Hydrologist (Medical): No    Lack of Transportation (Non-Medical): No  Physical Activity: Sufficiently Active (02/16/2021)   Exercise Vital Sign    Days of Exercise per Week: 5 days    Minutes of Exercise per Session: 40 min  Stress: No Stress Concern Present (02/16/2021)   [place]    Feeling of Stress : Not at all  Social Connections: [place] (02/16/2021)   Social Connection and Isolation Panel [NHANES]    Frequency of Communication with Friends and Family: More than three times a week    Frequency of Social Gatherings with Friends and Family: More than three times a week    Attends Religious Services: More than 4  times per year    Active Member of Clubs or Organizations: Yes    Attends Archivist Meetings: More than 4 times per year    Marital Status: Married   Vitals:   02/10/22 0847  BP: 120/68  Pulse: 70  Resp: 16  Temp: 98.3 F (36.8 C)  SpO2: 96%   Body mass index is 27.75 kg/m.  Wt Readings from Last 3 Encounters:  02/10/22 193 lb 6 oz (87.7 kg)  11/22/21 191 lb 3.2 oz (86.7 kg)  02/16/21 185 lb (83.9 kg)   Physical Exam Vitals and nursing note reviewed.  Constitutional:      General: He is not in acute distress.    Appearance: He is well-developed.  HENT:     Head: Normocephalic and atraumatic.     Right Ear: Tympanic membrane, ear canal and external ear normal.     Left Ear: Tympanic membrane, ear canal and external ear normal.     Mouth/Throat:     Mouth: Mucous membranes are moist.     Pharynx: Oropharynx is clear.  Eyes:     Conjunctiva/sclera: Conjunctivae normal.     Pupils: Pupils are equal, round, and reactive to light.  Neck:     Trachea: No tracheal deviation.  Cardiovascular:     Rate and Rhythm: Normal rate and regular rhythm.     Heart sounds: Murmur  (Soft SEM RUSB) heard.     Comments: DP and PT pulses palpable bilateral. Trace pitting LE edema, bilateral.  Pulmonary:     Effort: Pulmonary effort is normal. No respiratory distress.     Breath sounds: Normal breath sounds.  Abdominal:     Palpations: Abdomen is soft. There is no hepatomegaly or mass.     Tenderness: There is no abdominal tenderness.  Genitourinary:    Comments: No concerns. Musculoskeletal:        General: No tenderness.     Cervical back: Normal range of motion.     Comments: No signs of synovitis.  Lymphadenopathy:     Cervical: No cervical adenopathy.  Skin:    General: Skin is warm.     Findings: No erythema.  Neurological:     General: No focal deficit present.     Mental Status: He is alert and oriented to person, place, and time.     Cranial Nerves: No cranial nerve deficit.     Sensory: No sensory deficit.     Gait: Gait normal.     Deep Tendon Reflexes:     Reflex Scores:      Bicep reflexes are 2+ on the right side and 2+ on the left side.      Patellar reflexes are 2+ on the right side and 2+ on the left side. Psychiatric:        Mood and Affect: Mood and affect normal.   ASSESSMENT AND PLAN:  MV.EHMCNO was seen today for annual exam.  Diagnoses and all orders for this visit: Lab Results  Component Value Date   CREATININE 0.79 02/10/2022   BUN 17 02/10/2022   NA 141 02/10/2022   K 4.4 02/10/2022   CL 106 02/10/2022   CO2 26 02/10/2022   Lab Results  Component Value Date   ALT 24 02/10/2022   AST 22 02/10/2022   ALKPHOS 70 02/10/2022   BILITOT 0.8 02/10/2022   Lab Results  Component Value Date   CHOL 135 02/10/2022   HDL 57.30 02/10/2022   LDLCALC 54 02/10/2022  TRIG 119.0 02/10/2022   CHOLHDL 2 02/10/2022   Lab Results  Component Value Date   HGBA1C 6.1 02/10/2022   Routine general medical examination at a health care facility Assessment & Plan: We discussed the importance of regular physical activity as tolerated  and healthy diet for prevention of chronic illness and/or complications. Fall precautions. Preventive guidelines reviewed. Vaccination up to date. Next CPE in a year.   Essential hypertension Assessment & Plan: Adequately controlled. Continue Amlodipine 5 mg daily,Losartan 100 mg daily, and Metoprolol succinate 50 mg daily as well as DASH/low salt diet recommended. Eye exam current.  Orders: -     Comprehensive metabolic panel; Future  HYPERCHOLESTEROLEMIA Assessment & Plan: Continue Atorvastatin 20 mg daily.  Orders: -     Comprehensive metabolic panel; Future -     Lipid panel; Future  Prediabetes Assessment & Plan: Last HgA1C improved, 5.5. Continue a healthy life style for diabetes prevention.  Orders: -     Hemoglobin A1c; Future   Return in 67 weeks (on 05/25/2023) for CPE.  Michall Noffke G. [place], MD  [facility]. [place] office.

## 2022-02-10 ENCOUNTER — Encounter: Payer: Self-pay | Admitting: Family Medicine

## 2022-02-10 ENCOUNTER — Ambulatory Visit (INDEPENDENT_AMBULATORY_CARE_PROVIDER_SITE_OTHER): Payer: Medicare Other | Admitting: Family Medicine

## 2022-02-10 VITALS — BP 120/68 | HR 70 | Temp 98.3°F | Resp 16 | Ht 70.0 in | Wt 193.4 lb

## 2022-02-10 DIAGNOSIS — E7800 Pure hypercholesterolemia, unspecified: Secondary | ICD-10-CM | POA: Diagnosis not present

## 2022-02-10 DIAGNOSIS — Z0000 Encounter for general adult medical examination without abnormal findings: Secondary | ICD-10-CM | POA: Diagnosis not present

## 2022-02-10 DIAGNOSIS — I10 Essential (primary) hypertension: Secondary | ICD-10-CM

## 2022-02-10 DIAGNOSIS — R7303 Prediabetes: Secondary | ICD-10-CM | POA: Diagnosis not present

## 2022-02-10 LAB — LIPID PANEL
Cholesterol: 135 mg/dL (ref 0–200)
HDL: 57.3 mg/dL (ref >39.00)
LDL Cholesterol: 54 mg/dL (ref 0–99)
NonHDL: 78.08
Total CHOL/HDL Ratio: 2
Triglycerides: 119 mg/dL (ref 0.0–149.0)
VLDL: 23.8 mg/dL (ref 0.0–40.0)

## 2022-02-10 LAB — COMPREHENSIVE METABOLIC PANEL
ALT: 24 U/L (ref 0–53)
AST: 22 U/L (ref 0–37)
Albumin: 4.4 g/dL (ref 3.5–5.2)
Alkaline Phosphatase: 70 U/L (ref 39–117)
BUN: 17 mg/dL (ref 6–23)
CO2: 26 mEq/L (ref 19–32)
Calcium: 9.5 mg/dL (ref 8.4–10.5)
Chloride: 106 mEq/L (ref 96–112)
Creatinine, Ser: 0.79 mg/dL (ref 0.40–1.50)
GFR: 83.61 mL/min (ref >60.00)
Glucose, Bld: 90 mg/dL (ref 70–99)
Potassium: 4.4 mEq/L (ref 3.5–5.1)
Sodium: 141 mEq/L (ref 135–145)
Total Bilirubin: 0.8 mg/dL (ref 0.2–1.2)
Total Protein: 7.2 g/dL (ref 6.0–8.3)

## 2022-02-10 LAB — HEMOGLOBIN A1C: Hgb A1c MFr Bld: 6.1 % (ref 4.6–6.5)

## 2022-02-10 NOTE — Patient Instructions (Addendum)
A few things to remember from today's visit:  Routine general medical examination at a health care facility  Essential hypertension - Plan: Comprehensive metabolic panel  HYPERCHOLESTEROLEMIA - Plan: Comprehensive metabolic panel, Lipid panel  Prediabetes - Plan: Hemoglobin A1c  If you need refills for medications you take chronically, please call your pharmacy. Do not use My Chart to request refills or for acute issues that need immediate attention. If you send a my chart message, it may take a few days to be addressed, specially if I am not in the office.  Please be sure medication list is accurate. If a new problem present, please set up appointment sooner than planned today.

## 2022-02-11 NOTE — Assessment & Plan Note (Signed)
Adequately controlled. Continue Amlodipine 5 mg daily,Losartan 100 mg daily, and Metoprolol succinate 50 mg daily as well as DASH/low salt diet recommended. Eye exam current.

## 2022-02-11 NOTE — Assessment & Plan Note (Signed)
We discussed the importance of regular physical activity as tolerated and healthy diet for prevention of chronic illness and/or complications. Fall precautions. Preventive guidelines reviewed. Vaccination up to date. Next CPE in a year.

## 2022-02-11 NOTE — Assessment & Plan Note (Signed)
Continue Atorvastatin 20 mg daily.

## 2022-02-11 NOTE — Assessment & Plan Note (Signed)
Last HgA1C improved, 5.5. Continue a healthy life style for diabetes prevention.

## 2022-02-23 ENCOUNTER — Other Ambulatory Visit: Payer: Self-pay | Admitting: Family Medicine

## 2022-02-23 DIAGNOSIS — E7800 Pure hypercholesterolemia, unspecified: Secondary | ICD-10-CM

## 2022-03-15 ENCOUNTER — Ambulatory Visit (INDEPENDENT_AMBULATORY_CARE_PROVIDER_SITE_OTHER): Payer: Medicare Other

## 2022-03-15 VITALS — Ht 70.0 in | Wt 187.0 lb

## 2022-03-15 DIAGNOSIS — Z0000 Encounter for general adult medical examination without abnormal findings: Secondary | ICD-10-CM

## 2022-03-15 NOTE — Patient Instructions (Addendum)
William York , Thank you for taking time to come for your Medicare Wellness Visit. I appreciate your ongoing commitment to your health goals. Please review the following plan we discussed and let me know if I can assist you in the future.   These are the goals we discussed:  Goals       Patient Stated      Maintain your current health and avoid covid.      Patient Stated      Continue exercise       Stay active (pt-stated)      Stay [place]        This is a list of the screening recommended for you and due dates:  Health Maintenance  Topic Date Due   COVID-19 Vaccine (6 - 2023-24 season) 03/31/2022*   Medicare Annual Wellness Visit  03/15/2023   Pneumonia Vaccine  Completed   Flu Shot  Completed   Zoster (Shingles) Vaccine  Completed   HPV Vaccine  Aged Out   DTaP/Tdap/Td vaccine  Discontinued  *Topic was postponed. The date shown is not the original due date.    Advanced directives: Please bring a copy of your health care power of attorney and living will to the office to be added to your chart at your convenience.   Conditions/risks identified: None  Next appointment: Follow up in one year for your annual wellness visit.   Preventive Care 25 Years and Older, Male  Preventive care refers to lifestyle choices and visits with your health care provider that can promote health and wellness. What does preventive care include? A yearly physical exam. This is also called an annual well check. Dental exams once or twice a year. Routine eye exams. Ask your health care provider how often you should have your eyes checked. Personal lifestyle choices, including: Daily care of your teeth and gums. Regular physical activity. Eating a healthy diet. Avoiding tobacco and drug use. Limiting alcohol use. Practicing safe sex. Taking low doses of aspirin every day. Taking vitamin and mineral supplements as recommended by your health care provider. What happens during an annual well  check? The services and screenings done by your health care provider during your annual well check will depend on your age, overall health, lifestyle risk factors, and family history of disease. Counseling  Your health care provider may ask you questions about your: Alcohol use. Tobacco use. Drug use. Emotional well-being. Home and relationship well-being. Sexual activity. Eating habits. History of falls. Memory and ability to understand (cognition). Work and work Statistician. Screening  You may have the following tests or measurements: Height, weight, and BMI. Blood pressure. Lipid and cholesterol levels. These may be checked every 5 years, or more frequently if you are over 44 years old. Skin check. Lung cancer screening. You may have this screening every year starting at age 48 if you have a 30-pack-year history of smoking and currently smoke or have quit within the past 15 years. Fecal occult blood test (FOBT) of the stool. You may have this test every year starting at age 44. Flexible sigmoidoscopy or colonoscopy. You may have a sigmoidoscopy every 5 years or a colonoscopy every 10 years starting at age 50. Prostate cancer screening. Recommendations will vary depending on your family history and other risks. Hepatitis C blood test. Hepatitis B blood test. Sexually transmitted disease (STD) testing. Diabetes screening. This is done by checking your blood sugar (glucose) after you have not eaten for a while (fasting). You may have  this done every 1-3 years. Abdominal aortic aneurysm (AAA) screening. You may need this if you are a current or former smoker. Osteoporosis. You may be screened starting at age 70 if you are at high risk. Talk with your health care provider about your test results, treatment options, and if necessary, the need for more tests. Vaccines  Your health care provider may recommend certain vaccines, such as: Influenza vaccine. This is recommended every  year. Tetanus, diphtheria, and acellular pertussis (Tdap, Td) vaccine. You may need a Td booster every 10 years. Zoster vaccine. You may need this after age 20. Pneumococcal 13-valent conjugate (PCV13) vaccine. One dose is recommended after age 75. Pneumococcal polysaccharide (PPSV23) vaccine. One dose is recommended after age 59. Talk to your health care provider about which screenings and vaccines you need and how often you need them. This information is not intended to replace advice given to you by your health care provider. Make sure you discuss any questions you have with your health care provider. Document Released: 01/22/2015 Document Revised: 09/15/2015 Document Reviewed: 10/27/2014 Elsevier Interactive Patient Education  2017 [place] Prevention in the Home Falls can cause injuries. They can happen to people of all ages. There are many things you can do to make your home safe and to help prevent falls. What can I do on the outside of my home? Regularly fix the edges of walkways and driveways and fix any cracks. Remove anything that might make you trip as you walk through a door, such as a raised step or threshold. Trim any bushes or trees on the path to your home. Use bright outdoor lighting. Clear any walking paths of anything that might make someone trip, such as rocks or tools. Regularly check to see if handrails are loose or broken. Make sure that both sides of any steps have handrails. Any raised decks and porches should have guardrails on the edges. Have any leaves, snow, or ice cleared regularly. Use sand or salt on walking paths during winter. Clean up any spills in your garage right away. This includes oil or grease spills. What can I do in the bathroom? Use night lights. Install grab bars by the toilet and in the tub and shower. Do not use towel bars as grab bars. Use non-skid mats or decals in the tub or shower. If you need to sit down in the shower, use a  plastic, non-slip stool. Keep the floor dry. Clean up any water that spills on the floor as soon as it happens. Remove soap buildup in the tub or shower regularly. Attach bath mats securely with double-sided non-slip rug tape. Do not have throw rugs and other things on the floor that can make you trip. What can I do in the bedroom? Use night lights. Make sure that you have a light by your bed that is easy to reach. Do not use any sheets or blankets that are too big for your bed. They should not hang down onto the floor. Have a firm chair that has side arms. You can use this for support while you get dressed. Do not have throw rugs and other things on the floor that can make you trip. What can I do in the kitchen? Clean up any spills right away. Avoid walking on wet floors. Keep items that you use a lot in easy-to-reach places. If you need to reach something above you, use a strong step stool that has a grab bar. Keep electrical cords out  of the way. Do not use floor polish or wax that makes floors slippery. If you must use wax, use non-skid floor wax. Do not have throw rugs and other things on the floor that can make you trip. What can I do with my stairs? Do not leave any items on the stairs. Make sure that there are handrails on both sides of the stairs and use them. Fix handrails that are broken or loose. Make sure that handrails are as long as the stairways. Check any carpeting to make sure that it is firmly attached to the stairs. Fix any carpet that is loose or worn. Avoid having throw rugs at the top or bottom of the stairs. If you do have throw rugs, attach them to the floor with carpet tape. Make sure that you have a light switch at the top of the stairs and the bottom of the stairs. If you do not have them, ask someone to add them for you. What else can I do to help prevent falls? Wear shoes that: Do not have high heels. Have rubber bottoms. Are comfortable and fit you  well. Are closed at the toe. Do not wear sandals. If you use a stepladder: Make sure that it is fully opened. Do not climb a closed stepladder. Make sure that both sides of the stepladder are locked into place. Ask someone to hold it for you, if possible. Clearly mark and make sure that you can see: Any grab bars or handrails. First and last steps. Where the edge of each step is. Use tools that help you move around (mobility aids) if they are needed. These include: Canes. Walkers. Scooters. Crutches. Turn on the lights when you go into a dark area. Replace any light bulbs as soon as they burn out. Set up your furniture so you have a clear path. Avoid moving your furniture around. If any of your floors are uneven, fix them. If there are any pets around you, be aware of where they are. Review your medicines with your doctor. Some medicines can make you feel dizzy. This can increase your chance of falling. Ask your doctor what other things that you can do to help prevent falls. This information is not intended to replace advice given to you by your health care provider. Make sure you discuss any questions you have with your health care provider. Document Released: 10/22/2008 Document Revised: 06/03/2015 Document Reviewed: 01/30/2014 Elsevier Interactive Patient Education  2017 [employer].

## 2022-03-15 NOTE — Progress Notes (Signed)
Subjective:   William York is a 82 y.o. male who presents for Medicare Annual/Subsequent preventive examination.  Review of Systems    Virtual Visit via Telephone Note  I connected with  [name] on 03/15/22 at 10:00 AM EST by telephone and verified that I am speaking with the correct person using two identifiers.  Location: Patient: Home Provider: Office Persons participating in the virtual visit: patient/Nurse Health Advisor   I discussed the limitations, risks, security and privacy concerns of performing an evaluation and management service by telephone and the availability of in person appointments. The patient expressed understanding and agreed to proceed.  Interactive audio and video telecommunications were attempted between this nurse and patient, however failed, due to patient having technical difficulties OR patient did not have access to video capability.  We continued and completed visit with audio only.  Some vital signs may be absent or patient reported.   William Peaches, LPN  Cardiac Risk Factors include: advanced age (>40mn, >>70women);hypertension;male gender     Objective:    Today's Vitals   03/15/22 0946  Weight: 187 lb (84.8 kg)  Height: '5\' 10"'$  (1.778 m)   Body mass index is 26.83 kg/m.     03/15/2022    9:54 AM 02/16/2021    9:00 AM 09/18/2020   12:18 PM 02/11/2020    8:53 AM 01/27/2020   12:03 PM 01/21/2019    1:11 PM 09/11/2017    8:11 AM  Advanced Directives  Does Patient Have a Medical Advance Directive? Yes Yes No Yes Yes Yes Yes  Type of AParamedicof AHiramLiving will HWyolaLiving will  H[place]kasieLiving will HNettle LakeLiving will HKidronLiving will   Does patient want to make changes to medical advance directive?  No - Patient declined   No - Patient declined No - Patient declined   Copy of [place] Chart? No  - copy requested No - copy requested  No - copy requested No - copy requested No - copy requested   Would patient like information on creating a medical advance directive?   No - Patient declined        Current Medications (verified) Outpatient Encounter Medications as of 03/15/2022  Medication Sig   amLODipine (NORVASC) 5 MG tablet Take 1 tablet (5 mg total) by mouth daily.   aspirin EC 81 MG tablet Take 1 tablet (81 mg total) by mouth daily.   atorvastatin (LIPITOR) 20 MG tablet TAKE 1 TABLET BY MOUTH ONCE  DAILY   losartan (COZAAR) 100 MG tablet TAKE 1 TABLET BY MOUTH DAILY   metoprolol succinate (TOPROL-XL) 50 MG 24 hr tablet TAKE 1 TABLET BY MOUTH DAILY  WITH OR IMMEDIATELY FOLLOWING A  MEAL   nitroGLYCERIN (NITROSTAT) 0.4 MG SL tablet Place 1 tablet (0.4 mg total) under the tongue every 5 (five) minutes as needed.   valACYclovir (VALTREX) 1000 MG tablet TAKE 2 TABLETS BY MOUTH  UPON ACUTE ONSET AND REPEAT DOSE IN 12 HOURS.   No facility-administered encounter medications on file as of 03/15/2022.    Allergies (verified) Patient has no known allergies.   History: Past Medical History:  Diagnosis Date   Allergy    CAD (coronary artery disease)    ED (erectile dysfunction)    Hyperlipidemia    Hypertension    Internal hemorrhoids    OA (osteoarthritis)    Past Surgical History:  Procedure  Laterality Date   CORONARY ARTERY BYPASS GRAFT  June 1999   HERNIA REPAIR  May 2006   MASS EXCISION Right 01/27/2020   Procedure: RIGHT SMALL FINGER EXCISION MUCOID CYST;  Surgeon: Leanora Cover, MD;  Location: [facility];  Service: Orthopedics;  Laterality: Right;  Bier block   Family History  Problem Relation Age of Onset   Colon cancer Cousin        Mother side-First cousin   Stroke Father    Social History   Socioeconomic History   Marital status: Married    Spouse name: Not on file   Number of children: 2   Years of education: 18 years   Highest education level:  Master's degree (e.g., MA, MS, MEng, MEd, MSW, MBA)  Occupational History   Occupation: Retired  Tobacco Use   Smoking status: Never   Smokeless tobacco: Never  Vaping Use   Vaping Use: Never used  Substance and Sexual Activity   Alcohol use: Yes    Comment: one daily    Drug use: No   Sexual activity: Not on file  Other Topics Concern   Not on file  Social History Narrative   Married    [place] 2   1 son + 1 Daughter   5 grandchildren      Social Determinants of Health   Financial Resource Strain: Low Risk  (03/15/2022)   Overall Financial Resource Strain (CARDIA)    Difficulty of Paying Living Expenses: Not hard at all  Food Insecurity: No Food Insecurity (03/15/2022)   Hunger Vital Sign    Worried About Running Out of Food in the Last Year: Never true    [place] in the Last Year: Never true  Transportation Needs: No Transportation Needs (02/16/2021)   PRAPARE - Hydrologist (Medical): No    Lack of Transportation (Non-Medical): No  Physical Activity: Insufficiently Active (03/15/2022)   Exercise Vital Sign    Days of Exercise per Week: 3 days    Minutes of Exercise per Session: 30 min  Stress: No Stress Concern Present (03/15/2022)   [place]    Feeling of Stress : Not at all  Social Connections: [place] (03/15/2022)   Social Connection and Isolation Panel [NHANES]    Frequency of Communication with Friends and Family: More than three times a week    Frequency of Social Gatherings with Friends and Family: More than three times a week    Attends Religious Services: More than 4 times per year    Active Member of [employer] or Organizations: Yes    Attends Music therapist: More than 4 times per year    Marital Status: Married    Tobacco Counseling Counseling given: Not Answered   Clinical Intake:  Pre-visit preparation completed: Yes  Pain : No/denies  pain     BMI - recorded: 26.83 Nutritional Status: BMI 25 -29 Overweight Nutritional Risks: None Diabetes: No  How often do you need to have someone help you when you read instructions, pamphlets, or other written materials from your doctor or pharmacy?: 1 - Never  Diabetic?  No  Interpreter Needed?: No  Information entered by :: Rolene Arbour LPN   Activities of Daily Living    03/15/2022    9:51 AM 03/15/2022    8:15 AM  In your present state of health, do you have any difficulty performing the following activities:  Hearing? 1 0  Comment Wears hearing aids   Vision? 0 0  Difficulty concentrating or making decisions? 0 0  Walking or climbing stairs? 0   Dressing or bathing? 0 0  Doing errands, shopping? 0 0  Preparing Food and eating ? N N  Using the Toilet? N N  In the past six months, have you accidently leaked urine? N N  Do you have problems with loss of bowel control? N N  Managing your Medications? N N  Managing your Finances? N N  Housekeeping or managing your Housekeeping? N N    Patient Care Team: [place], [name], MD as PCP - General (Family Medicine) Belva Crome, MD (Inactive) as PCP - Cardiology (Cardiology) Belva Crome, MD (Inactive) as Consulting Physician (Cardiology) Kathie Rhodes, MD (Inactive) as Consulting Physician (Urology) Melissa Noon, [place] as Referring Physician (Optometry)  Indicate any recent Medical Services you may have received from other than [facility] providers in the past year (date may be approximate).     Assessment:   This is a routine wellness examination for Charles.  Hearing/Vision screen Hearing Screening - Comments:: Wears hearing aids Vision Screening - Comments:: Wears rx glasses - up to date with routine eye exams with  Dr Delman Cheadle  Dietary issues and exercise activities discussed: Current Exercise Habits: Home exercise routine, Type of exercise: walking, Time (Minutes): 30, Frequency (Times/Week): 2, Weekly Exercise  (Minutes/Week): 60, Intensity: Moderate, Exercise limited by: None identified   Goals Addressed               This Visit's Progress     Stay active (pt-stated)        Stay [place]       Depression Screen    03/15/2022    9:50 AM 02/10/2022    8:47 AM 02/16/2021    8:57 AM 02/11/2020    8:51 AM 01/23/2020    7:58 AM 01/21/2019    1:13 PM 01/07/2019    5:46 PM  PHQ 2/9 Scores  PHQ - 2 Score 0 0 0 0 0 0 0    Fall Risk    03/15/2022    9:52 AM 03/15/2022    8:15 AM 02/10/2022    8:47 AM 02/16/2021    9:00 AM 02/15/2021    1:52 PM  Fall Risk   Falls in the past year? 0 0 0 0 0  Number falls in past yr:  0 0 0   Injury with Fall?  0 0 0   Risk for fall due to :  No Fall Risks Other (Comment) No Fall Risks   Follow up  Falls prevention discussed Falls evaluation completed      [place]:  Any stairs in or around the home? Yes  If so, are there any without handrails? No  Home free of loose throw rugs in walkways, pet beds, electrical cords, etc? Yes  Adequate lighting in your home to reduce risk of falls? Yes   ASSISTIVE DEVICES UTILIZED TO PREVENT FALLS:  Life alert? No  Use of a cane, walker or w/c? No  Grab bars in the bathroom? Yes  Shower chair or bench in shower? No Elevated toilet [place] or a handicapped toilet? Yes   TIMED UP AND GO:  Was the test performed? No . Audio Visit   Cognitive Function:        03/15/2022    9:54 AM 02/16/2021    9:01 AM 02/11/2020  8:56 AM 01/21/2019    1:15 PM  6CIT Screen  What Year? 0 points 0 points 0 points 0 points  What month? 0 points 0 points 0 points 0 points  What time? 0 points 0 points    Count back from 20 0 points 0 points 0 points 0 points  Months in reverse 0 points 0 points 0 points 0 points  Repeat phrase 0 points 0 points 0 points 0 points  Total Score 0 points 0 points      Immunizations Immunization History  Administered Date(s) Administered   Fluad Quad(high Dose 65+)  09/12/2018   Influenza Split 09/18/2012   Influenza Whole 11/01/2006, 10/09/2008, 09/23/2011   Influenza, High Dose Seasonal PF 10/07/2013, 09/30/2017   Influenza-Unspecified 10/04/2014, 10/05/2015, 09/20/2016, 09/18/2019, 10/19/2020, 09/30/2021   PFIZER(Purple Top)SARS-COV-2 Vaccination 01/23/2019, 02/11/2019, 10/10/2019, 11/17/2020   Pneumococcal Conjugate-13 10/07/2013   Pneumococcal Polysaccharide-23 01/09/1997, 07/01/2004, 08/15/2012   Td 06/26/2006   Tdap 07/21/2010   Unspecified SARS-COV-2 Vaccination 09/30/2021   Zoster Recombinat (Shingrix) 10/18/2018, 12/20/2018   Zoster, Live 09/23/2012    TDAP status: Up to date  Flu Vaccine status: Up to date  Pneumococcal vaccine status: Up to date  Covid-19 vaccine status: Completed vaccines  Qualifies for Shingles Vaccine? Yes   Zostavax completed Yes   Shingrix Completed?: Yes  Screening Tests Health Maintenance  Topic Date Due   COVID-19 Vaccine (6 - 2023-24 season) 03/31/2022 (Originally 11/25/2021)   Medicare Annual Wellness (AWV)  03/15/2023   Pneumonia Vaccine 79+ Years old  Completed   INFLUENZA VACCINE  Completed   Zoster Vaccines- Shingrix  Completed   HPV VACCINES  Aged Out   DTaP/Tdap/Td  Discontinued    Health Maintenance  There are no preventive care reminders to display for this patient.   Colorectal cancer screening: No longer required.   Lung Cancer Screening: (Low Dose CT Chest recommended if Age 85-80 years, 30 pack-year currently smoking OR have quit w/in 15years.) does not qualify.     Additional Screening:  Hepatitis C Screening: does not qualify; Completed   Vision Screening: Recommended annual ophthalmology exams for early detection of glaucoma and other disorders of the eye. Is the patient up to date with their annual eye exam?  Yes  Who is the provider or what is the name of the office in which the patient attends annual eye exams? Dr Delman Cheadle If pt is not established with a provider,  would they like to be referred to a provider to establish care? No .   Dental Screening: Recommended annual dental exams for proper oral hygiene  Community Resource Referral / Chronic Care Management:  CRR required this visit?  No   CCM required this visit?  No      Plan:     I have personally reviewed and noted the following in the patient's chart:   Medical and social history Use of alcohol, tobacco or illicit drugs  Current medications and supplements including opioid prescriptions. Patient is not currently taking opioid prescriptions. Functional ability and status Nutritional status Physical activity Advanced directives List of other physicians Hospitalizations, surgeries, and ER visits in previous 12 months Vitals Screenings to include cognitive, depression, and falls Referrals and appointments  In addition, I have reviewed and discussed with patient certain preventive protocols, quality metrics, and best practice recommendations. A written personalized care plan for preventive services as well as general preventive health recommendations were provided to patient.     William Peaches, LPN   [id number]  Nurse Notes: None

## 2022-03-23 ENCOUNTER — Other Ambulatory Visit: Payer: Self-pay | Admitting: Family Medicine

## 2022-03-23 DIAGNOSIS — I10 Essential (primary) hypertension: Secondary | ICD-10-CM

## 2022-04-10 ENCOUNTER — Other Ambulatory Visit: Payer: Self-pay | Admitting: Family Medicine

## 2022-04-21 ENCOUNTER — Ambulatory Visit (INDEPENDENT_AMBULATORY_CARE_PROVIDER_SITE_OTHER): Payer: Medicare Other | Admitting: Family

## 2022-04-21 ENCOUNTER — Encounter: Payer: Self-pay | Admitting: Family

## 2022-04-21 VITALS — BP 128/68 | HR 60 | Resp 19 | Ht 70.0 in | Wt 196.0 lb

## 2022-04-21 DIAGNOSIS — R051 Acute cough: Secondary | ICD-10-CM

## 2022-04-21 LAB — POCT INFLUENZA A/B
Influenza A, POC: NEGATIVE
Influenza B, POC: NEGATIVE

## 2022-04-21 MED ORDER — OSELTAMIVIR PHOSPHATE 75 MG PO CAPS: 75.0000 mg | ORAL_CAPSULE | Freq: Two times a day (BID) | ORAL | 0 refills | Status: DC

## 2022-04-21 NOTE — Progress Notes (Signed)
William York is a 82 y.o. male with the following history as recorded in EpicCare:  Patient Active Problem List   Diagnosis Date Noted   Prediabetes 02/10/2022   Routine general medical examination at a health care facility 02/10/2022   Hearing loss, bilateral 09/05/2016   Recurrent herpes labialis 08/23/2015   Actinic keratosis 08/15/2011   CAD (coronary artery disease) 07/08/2007   OSTEOARTHRITIS 07/08/2007   HYPERCHOLESTEROLEMIA 04/13/2007   ERECTILE DYSFUNCTION 10/04/2006   Essential hypertension 10/04/2006   Allergic rhinitis 10/04/2006   INTERNAL HEMORRHOIDS 07/15/2004    Current Outpatient Medications  Medication Sig Dispense Refill   amLODipine (NORVASC) 5 MG tablet Take 1 tablet (5 mg total) by mouth daily. 90 tablet 3   aspirin EC 81 MG tablet Take 1 tablet (81 mg total) by mouth daily. 90 tablet 3   atorvastatin (LIPITOR) 20 MG tablet TAKE 1 TABLET BY MOUTH ONCE  DAILY 90 tablet 3   losartan (COZAAR) 100 MG tablet TAKE 1 TABLET BY MOUTH DAILY 90 tablet 3   metoprolol succinate (TOPROL-XL) 50 MG 24 hr tablet TAKE 1 TABLET BY MOUTH DAILY  WITH OR IMMEDIATELY FOLLOWING A  MEAL 90 tablet 3   nitroGLYCERIN (NITROSTAT) 0.4 MG SL tablet Place 1 tablet (0.4 mg total) under the tongue every 5 (five) minutes as needed. 25 tablet 0   valACYclovir (VALTREX) 1000 MG tablet TAKE 2 TABLETS BY MOUTH  UPON ACUTE ONSET AND REPEAT DOSE IN 12 HOURS. 16 tablet 3   No current facility-administered medications for this visit.    Allergies: Patient has no known allergies.  Past Medical History:  Diagnosis Date   Allergy    CAD (coronary artery disease)    ED (erectile dysfunction)    Hyperlipidemia    Hypertension    Internal hemorrhoids    OA (osteoarthritis)     Past Surgical History:  Procedure Laterality Date   CORONARY ARTERY BYPASS GRAFT  June 1999   HERNIA REPAIR  May 2006   MASS EXCISION Right 01/27/2020   Procedure: RIGHT SMALL FINGER EXCISION MUCOID CYST;  Surgeon: Betha Loa, MD;  Location: [facility];  Service: Orthopedics;  Laterality: Right;  Bier block    Family History  Problem Relation Age of Onset   Colon cancer Cousin        Mother side-First cousin   Stroke Father     Social History   Tobacco Use   Smoking status: Never   Smokeless tobacco: Never  Substance Use Topics   Alcohol use: Yes    Comment: one daily     Subjective:   Seen with his wife who was on my schedule today; asked to be worked in for acute care visit; wife has been sick for 2 days- tested positive for flu at time of OV; cough/ congestion/ body aches;   Objective:  Vitals:   04/21/22 1606  BP: 128/68  Pulse: 60  Resp: 19  SpO2: 95%  Weight: 196 lb (88.9 kg)  Height:  (1.778 m)    General: Well developed, well nourished, in no acute distress  Skin : Warm and dry.  Head: Normocephalic and atraumatic  Lungs: Respirations unlabored; clear to auscultation bilaterally without wheeze, rales, rhonchi  CVS exam: normal rate and regular rhythm.  Neurologic: Alert and oriented; speech intact; face symmetrical; moves all extremities well; CNII-XII intact without focal deficit   Assessment:  1. Acute cough     Plan:  Due to symptoms and wife's diagnosis,  will go ahead and treat patient for flu; his rapid flu in office was negative; Rx for Tamiflu 75 mg bid x 5 days; symptomatic treatment discussed and encouraged to work on hydration; follow up with his PCP for continued symptoms.   No follow-ups on file.  Orders Placed This Encounter  Procedures   POCT Influenza A/B    Requested Prescriptions    No prescriptions requested or ordered in this encounter

## 2022-05-16 IMAGING — DX DG CHEST 2V
2 series · 2 of 2 positions shown · non-contrast
Comparison: None.

CLINICAL DATA: 80-year-old male with cough

EXAM:
CHEST - 2 VIEW

[chest pa]
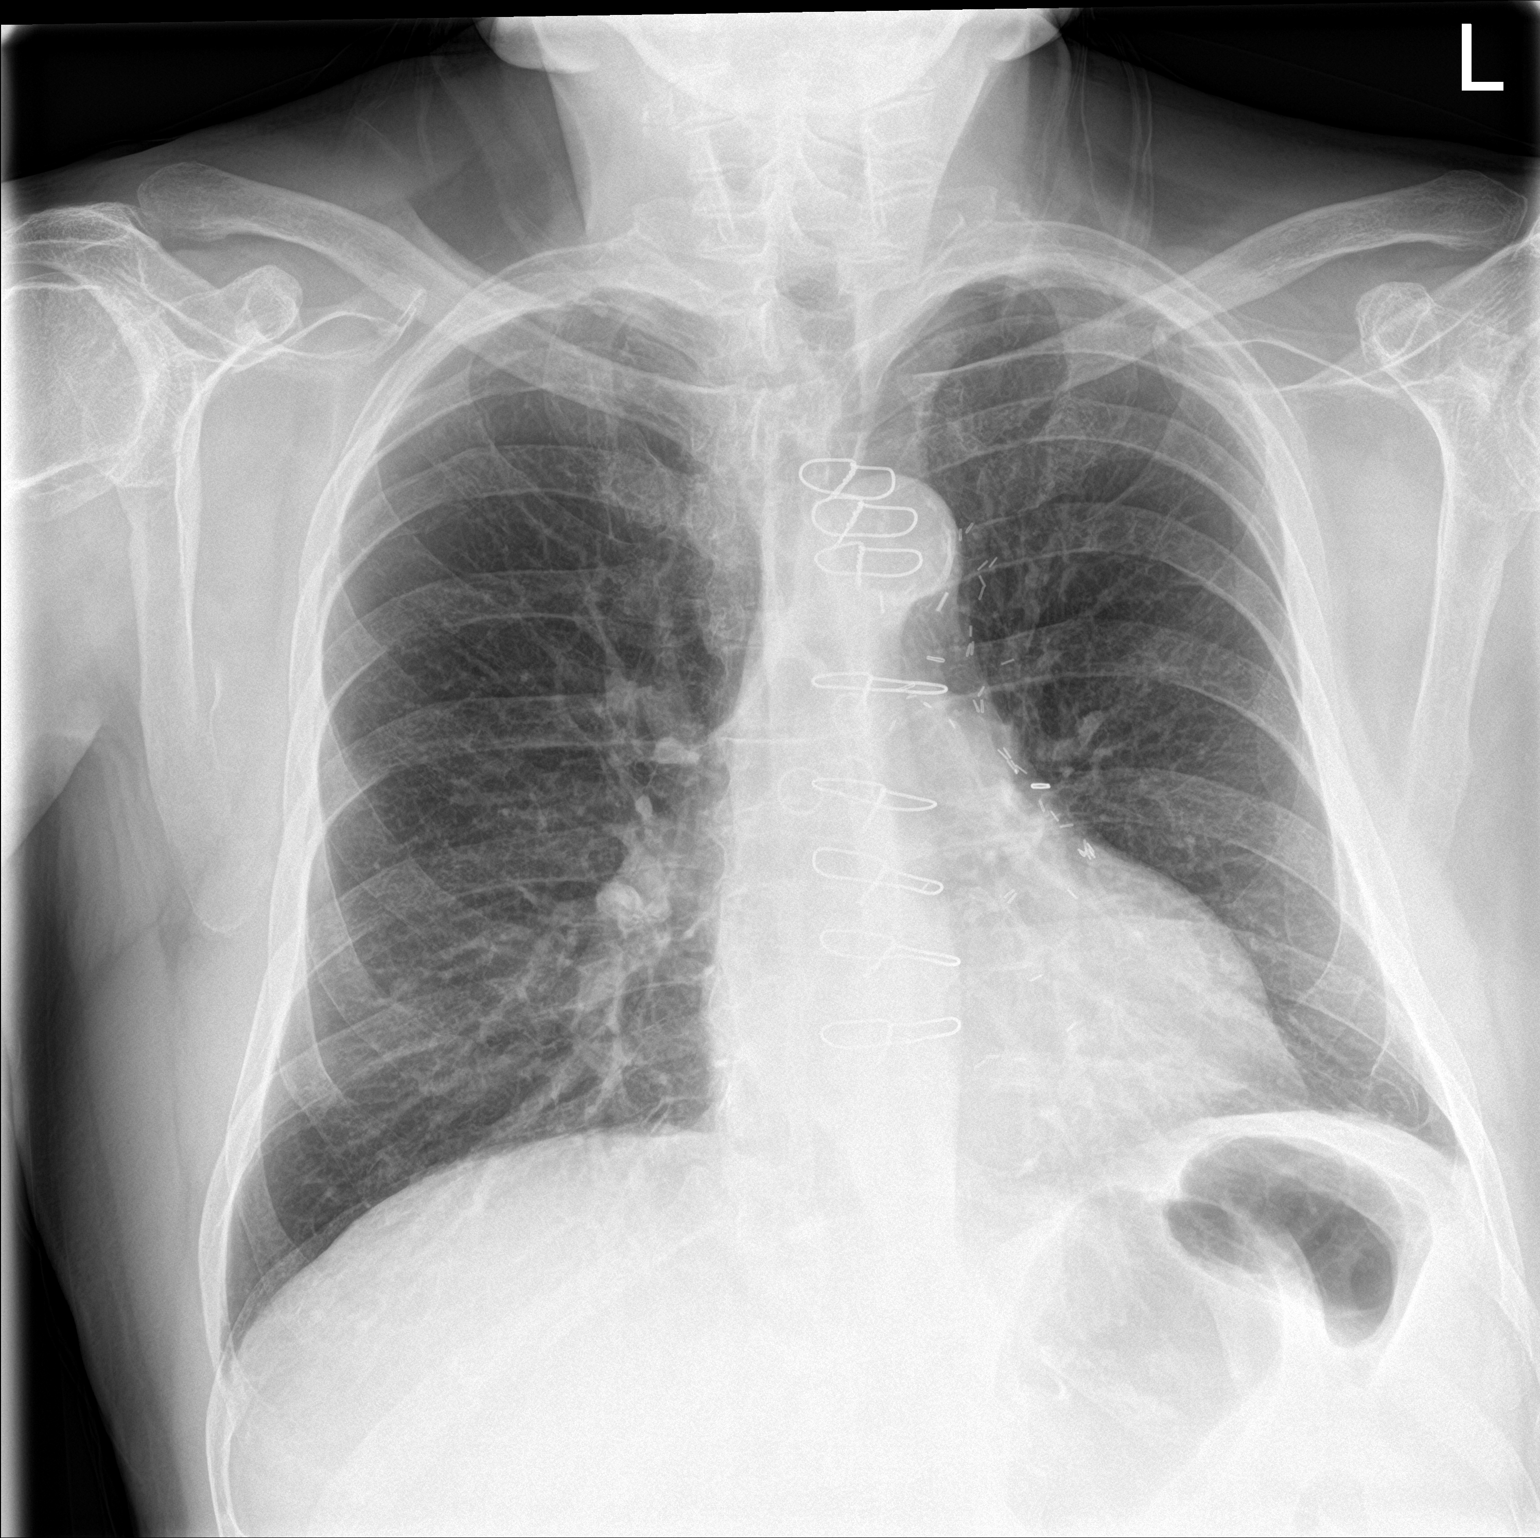

[chest lat]
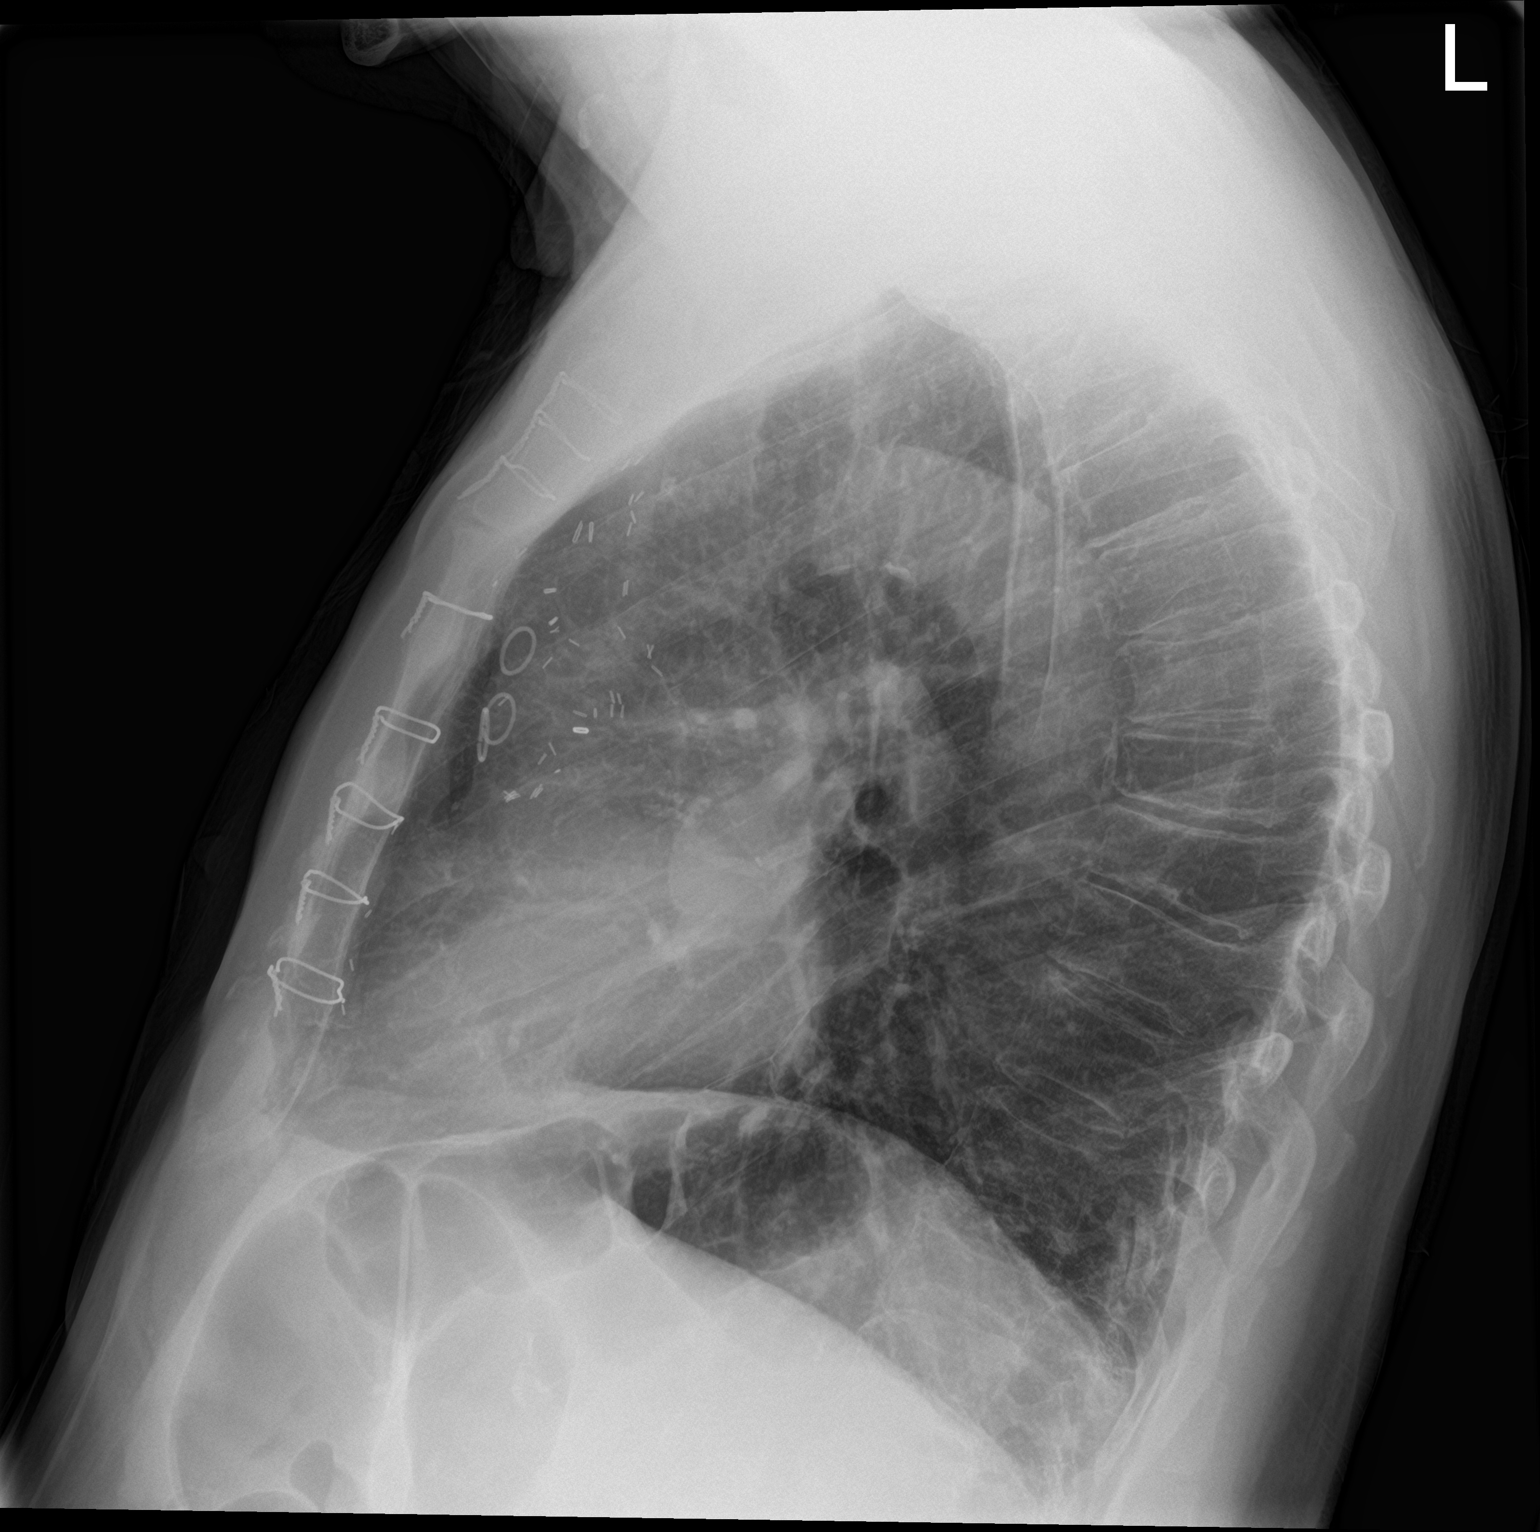

[2 of 2 positions shown; findings below may reference images not displayed]

FINDINGS: Cardiomediastinal silhouette within normal limits in size and
contour. No evidence of central vascular congestion. No interlobular
septal thickening.

Surgical changes of median sternotomy and CABG.

Stigmata of emphysema, with increased retrosternal airspace,
flattened hemidiaphragms, increased AP diameter, and hyperinflation
on the AP view.

No pneumothorax or pleural effusion. Coarsened interstitial
markings, with no confluent airspace disease.

No acute displaced fracture. Degenerative changes of the spine.
IMPRESSION: Emphysema without definite evidence of acute cardiopulmonary
disease.

Surgical changes of median sternotomy and CABG

## 2022-05-30 DIAGNOSIS — Z85828 Personal history of other malignant neoplasm of skin: Secondary | ICD-10-CM | POA: Diagnosis not present

## 2022-05-30 DIAGNOSIS — L570 Actinic keratosis: Secondary | ICD-10-CM | POA: Diagnosis not present

## 2022-05-30 DIAGNOSIS — L821 Other seborrheic keratosis: Secondary | ICD-10-CM | POA: Diagnosis not present

## 2022-05-30 DIAGNOSIS — L812 Freckles: Secondary | ICD-10-CM | POA: Diagnosis not present

## 2022-07-28 ENCOUNTER — Encounter: Payer: Self-pay | Admitting: Family Medicine

## 2022-07-28 ENCOUNTER — Other Ambulatory Visit: Payer: Self-pay | Admitting: Family Medicine

## 2022-07-28 DIAGNOSIS — B001 Herpesviral vesicular dermatitis: Secondary | ICD-10-CM

## 2022-07-28 DIAGNOSIS — I2510 Atherosclerotic heart disease of native coronary artery without angina pectoris: Secondary | ICD-10-CM

## 2022-07-30 MED ORDER — NITROGLYCERIN 0.4 MG SL SUBL: 0.4000 mg | SUBLINGUAL_TABLET | SUBLINGUAL | 0 refills | Status: DC | PRN

## 2022-07-31 MED ORDER — VALACYCLOVIR HCL 1 G PO TABS
ORAL_TABLET | ORAL | 3 refills | Status: AC
Start: 2022-07-31 — End: ?

## 2022-07-31 NOTE — Addendum Note (Signed)
Addended by: Kathreen Devoid on: 07/31/2022 08:09 AM   Modules accepted: Orders

## 2022-10-22 ENCOUNTER — Encounter: Payer: Self-pay | Admitting: Family Medicine

## 2022-10-30 ENCOUNTER — Other Ambulatory Visit: Payer: Self-pay | Admitting: Family Medicine

## 2022-10-30 DIAGNOSIS — E7800 Pure hypercholesterolemia, unspecified: Secondary | ICD-10-CM

## 2022-12-09 ENCOUNTER — Other Ambulatory Visit: Payer: Self-pay | Admitting: Family Medicine

## 2022-12-09 DIAGNOSIS — I10 Essential (primary) hypertension: Secondary | ICD-10-CM

## 2022-12-14 DIAGNOSIS — H35372 Puckering of macula, left eye: Secondary | ICD-10-CM | POA: Diagnosis not present

## 2022-12-14 DIAGNOSIS — H52203 Unspecified astigmatism, bilateral: Secondary | ICD-10-CM | POA: Diagnosis not present

## 2023-01-02 ENCOUNTER — Other Ambulatory Visit: Payer: Self-pay | Admitting: Family Medicine

## 2023-01-05 ENCOUNTER — Other Ambulatory Visit: Payer: Self-pay

## 2023-01-05 MED ORDER — AMLODIPINE BESYLATE 5 MG PO TABS: 5.0000 mg | ORAL_TABLET | Freq: Every day | ORAL | 0 refills | Status: DC

## 2023-01-08 ENCOUNTER — Other Ambulatory Visit: Payer: Self-pay | Admitting: Family Medicine

## 2023-01-08 DIAGNOSIS — E7800 Pure hypercholesterolemia, unspecified: Secondary | ICD-10-CM

## 2023-01-25 ENCOUNTER — Encounter: Payer: Self-pay | Admitting: Cardiology

## 2023-01-25 ENCOUNTER — Ambulatory Visit: Payer: Medicare Other | Attending: Cardiology | Admitting: Cardiology

## 2023-01-25 VITALS — BP 120/62 | HR 65 | Ht 70.0 in | Wt 176.6 lb

## 2023-01-25 DIAGNOSIS — I10 Essential (primary) hypertension: Secondary | ICD-10-CM | POA: Diagnosis not present

## 2023-01-25 DIAGNOSIS — E7800 Pure hypercholesterolemia, unspecified: Secondary | ICD-10-CM

## 2023-01-25 DIAGNOSIS — I25810 Atherosclerosis of coronary artery bypass graft(s) without angina pectoris: Secondary | ICD-10-CM | POA: Diagnosis not present

## 2023-01-25 NOTE — Patient Instructions (Signed)
Medication Instructions:  The current medical regimen is effective;  continue present plan and medications.  *If you need a refill on your cardiac medications before your next appointment, please call your pharmacy*  Follow-Up: At [facility], you and your health needs are our priority.  As part of our continuing mission to provide you with exceptional heart care, we have created designated Provider Care Teams.  These Care Teams include your primary Cardiologist (physician) and Advanced Practice Providers (APPs -  Physician Assistants and Nurse Practitioners) who all work together to provide you with the care you need, when you need it.  We recommend signing up for the patient portal called "MyChart".  Sign up information is provided on this After Visit Summary.  MyChart is used to connect with patients for Virtual Visits (Telemedicine).  Patients are able to view lab/test results, encounter notes, upcoming appointments, etc.  Non-urgent messages can be sent to your provider as well.   To learn more about what you can do with MyChart, go to [email or web address].    Your next appointment:   1 year(s)  Provider:   Jari Favre, PA-C or Eligha Bridegroom, NP

## 2023-01-25 NOTE — Progress Notes (Addendum)
Cardiology Office Note:  .   Date:  01/25/2023  ID:  Manual Meier, DOB Mar 24, 1940, MRN [id number] PCP: [place], [name], MD  [facility] Providers Cardiologist:  Donato Schultz, MD     History of Present Illness: .   William York is a 83 y.o. male Discussed with the use of AI scribe software   History of Present Illness   The patient is an 83 year old individual with a history of coronary artery disease, hypertension, and osteoarthritis. He underwent bypass surgery in 1999 Dr. Laneta Simmers and has been doing well since, with no reported chest pain.   The patient is currently on aspirin (81mg ), atorvastatin (20mg ), metoprolol succinate (50mg ), and amlodipine (5mg ). His blood pressure is well-controlled. An echocardiogram in November 2019 showed an EF of 60% and trivial mitral regurgitation. The patient also has a chronic bifascicular block.  The patient reports no current health issues, including chest pain or shortness of breath. His LDL cholesterol was 54 as of February 2024. The patient's hemoglobin A1c was 6.1, indicating a pre-diabetic state. His creatinine was 0.8, suggesting good kidney function. The patient's EKG was stable with no changes from the previous one, despite a right bundle branch block. The patient is retired and enjoys an active lifestyle, including volunteering and spending time with his grandchildren.      He has grandchildren in [place] as well as [place].  One grandchild is at [place] studying nursing.  He originally worked for 18 years in [place] for [employer] and then was transferred to [place] to work at [employer] for another 18 years.  He retired in 2002.     Studies Reviewed: Marland Kitchen   EKG Interpretation Date/Time:  Thursday January 25 2023 09:12:24 EST Ventricular Rate:  65 PR Interval:  162 QRS Duration:  150 QT Interval:  442 QTC Calculation: 459 R Axis:   -56  Text Interpretation: Normal sinus rhythm Right bundle branch block Left  anterior fascicular block Bifascicular block No previous ECGs available Confirmed by Donato Schultz ([id number]) on 01/25/2023 9:30:05 AM    Results   LABS LDL cholesterol: 54 (02/10/2022) HbA1c: 6.1 Creatinine: 0.8  DIAGNOSTIC Echocardiogram: EF 60%, trivial mitral regurgitation (11/2017) EKG: Right bundle branch block, stable (01/25/2023)     Risk Assessment/Calculations:            Physical Exam:   VS:  BP 120/62   Pulse 65   Ht 5\' 10"  (1.778 m)   Wt 176 lb 9.6 oz (80.1 kg)   SpO2 96%   BMI 25.34 kg/m    Wt Readings from Last 3 Encounters:  01/25/23 176 lb 9.6 oz (80.1 kg)  04/21/22 196 lb (88.9 kg)  03/15/22 187 lb (84.8 kg)    GEN: Well nourished, well developed in no acute distress NECK: No JVD; No carotid bruits CARDIAC: RRR, 2/6 systolic murmur, no rubs, no gallops RESPIRATORY:  Clear to auscultation without rales, wheezing or rhonchi  ABDOMEN: Soft, non-tender, non-distended EXTREMITIES:  No edema; No deformity   ASSESSMENT AND PLAN: .    Assessment and Plan    Coronary Artery Disease (CAD) Follow-up for CAD with prior bypass surgery in 1999. No current chest pain or dyspnea. Blood pressure is excellent. LDL cholesterol is 54 mg/dL. Echocardiogram in November 2019 showed EF of 60% with trivial mitral regurgitation. EKG today is stable with no changes from previous. Chronic bifascicular block noted. Discussed low likelihood of needing a pacemaker given current stability. Explained potential symptoms indicating conduction  system deterioration, though unlikely. - Continue aspirin 81 mg daily - Continue atorvastatin 20 mg daily - Continue metoprolol succinate 50 mg daily - Continue amlodipine 5 mg daily  Hypertension Blood pressure is well-controlled with current medications. - Continue current antihypertensive regimen  Prediabetes Hemoglobin A1c is 6.1%, indicating prediabetes. Discussed importance of monitoring and lifestyle modifications to prevent progression to  diabetes. - Monitor hemoglobin A1c - Advise on reducing sweets and sugars  General Health Maintenance Overall health is stable. Kidney function is normal with creatinine at 0.8 mg/dL. Discussed importance of maintaining a Mediterranean diet and regular physical activity. - Encourage Mediterranean diet - Encourage regular physical activity  Follow-up - Schedule annual follow-up with advanced practice provider - Advise to reach out if any new symptoms or concerns arise.               Signed, Donato Schultz, MD

## 2023-02-26 ENCOUNTER — Other Ambulatory Visit: Payer: Self-pay | Admitting: Family Medicine

## 2023-02-26 DIAGNOSIS — I2510 Atherosclerotic heart disease of native coronary artery without angina pectoris: Secondary | ICD-10-CM

## 2023-02-27 ENCOUNTER — Other Ambulatory Visit: Payer: Self-pay | Admitting: Family Medicine

## 2023-02-27 DIAGNOSIS — I10 Essential (primary) hypertension: Secondary | ICD-10-CM

## 2023-03-03 ENCOUNTER — Other Ambulatory Visit: Payer: Self-pay | Admitting: Cardiology

## 2023-03-16 DIAGNOSIS — H10501 Unspecified blepharoconjunctivitis, right eye: Secondary | ICD-10-CM | POA: Diagnosis not present

## 2023-03-16 DIAGNOSIS — H00012 Hordeolum externum right lower eyelid: Secondary | ICD-10-CM | POA: Diagnosis not present

## 2023-03-26 ENCOUNTER — Ambulatory Visit: Payer: Medicare Other

## 2023-03-26 VITALS — Ht 70.0 in | Wt 176.0 lb

## 2023-03-26 DIAGNOSIS — Z0000 Encounter for general adult medical examination without abnormal findings: Secondary | ICD-10-CM | POA: Diagnosis not present

## 2023-03-26 NOTE — Progress Notes (Signed)
 Subjective:   William York is a 83 y.o. who presents for a Medicare Wellness preventive visit.  Visit Complete: Virtual I connected with  Manual Meier on 03/26/23 by a audio enabled telemedicine application and verified that I am speaking with the correct person using two identifiers.  Patient Location: Home  Provider Location: Home Office  I discussed the limitations of evaluation and management by telemedicine. The patient expressed understanding and agreed to proceed.  Vital Signs: Because this visit was a virtual/telehealth visit, some criteria may be missing or patient reported. Any vitals not documented were not able to be obtained and vitals that have been documented are patient reported.  VideoDeclined- This patient declined Librarian, academic. Therefore the visit was completed with audio only.  Persons Participating in Visit: Patient.  AWV Questionnaire: No: Patient Medicare AWV questionnaire was not completed prior to this visit.  Cardiac Risk Factors include: advanced age (>62men, >79 women);male gender;hypertension     Objective:    Today's Vitals   03/26/23 1500  Weight: 176 lb (79.8 kg)  Height: 5\' 10"  (1.778 m)   Body mass index is 25.25 kg/m.     03/26/2023    3:06 PM 03/15/2022    9:54 AM 02/16/2021    9:00 AM 09/18/2020   12:18 PM 02/11/2020    8:53 AM 01/27/2020   12:03 PM 01/21/2019    1:11 PM  Advanced Directives  Does Patient Have a Medical Advance Directive? Yes Yes Yes No Yes Yes Yes  Type of Estate agent of [place];Living will Healthcare Power of [place];Living will Healthcare Power of [place];Living will  Healthcare Power of [place];Living will Healthcare Power of [place];Living will Healthcare Power of [place];Living will  Does patient want to make changes to medical advance directive?   No - Patient declined   No - Patient declined No - Patient declined  Copy of Healthcare Power of Attorney  in Chart? No - copy requested No - copy requested No - copy requested  No - copy requested No - copy requested No - copy requested  Would patient like information on creating a medical advance directive?    No - Patient declined       Current Medications (verified) Outpatient Encounter Medications as of 03/26/2023  Medication Sig   amLODipine (NORVASC) 5 MG tablet TAKE 1 TABLET BY MOUTH DAILY   aspirin EC 81 MG tablet Take 1 tablet (81 mg total) by mouth daily.   atorvastatin (LIPITOR) 20 MG tablet TAKE 1 TABLET BY MOUTH ONCE  DAILY   losartan (COZAAR) 100 MG tablet TAKE 1 TABLET BY MOUTH DAILY   metoprolol succinate (TOPROL-XL) 50 MG 24 hr tablet TAKE 1 TABLET BY MOUTH  DAILY WITH OR IMMEDIATELY  FOLLOWING A MEAL. DUE FOR FOLLOW UP WITH PCP.   nitroGLYCERIN (NITROSTAT) 0.4 MG SL tablet PLACE 1 TABLET UNDER THE TONGUE EVERY 5 MINUTES AS NEEDED.   valACYclovir (VALTREX) 1000 MG tablet TAKE 2 TABLETS BY MOUTH  UPON ACUTE ONSET AND REPEAT DOSE IN 12 HOURS.   No facility-administered encounter medications on file as of 03/26/2023.    Allergies (verified) Patient has no known allergies.   History: Past Medical History:  Diagnosis Date   Allergy    CAD (coronary artery disease)    ED (erectile dysfunction)    Hyperlipidemia    Hypertension    Internal hemorrhoids    OA (osteoarthritis)    Past Surgical History:  Procedure Laterality Date  CORONARY ARTERY BYPASS GRAFT  June 1999   HERNIA REPAIR  May 2006   MASS EXCISION Right 01/27/2020   Procedure: RIGHT SMALL FINGER EXCISION MUCOID CYST;  Surgeon: Betha Loa, MD;  Location: [facility];  Service: Orthopedics;  Laterality: Right;  Bier block   Family History  Problem Relation Age of Onset   Colon cancer Cousin        Mother side-First cousin   Stroke Father    Social History   Socioeconomic History   Marital status: Married    Spouse name: Not on file   Number of children: 2   Years of education: 18 years    Highest education level: Master's degree (e.g., MA, MS, MEng, MEd, MSW, MBA)  Occupational History   Occupation: Retired  Tobacco Use   Smoking status: Never   Smokeless tobacco: Never  Vaping Use   Vaping status: Never Used  Substance and Sexual Activity   Alcohol use: Yes    Comment: one daily    Drug use: No   Sexual activity: Not on file  Other Topics Concern   Not on file  Social History Narrative   Married    HH 2   1 son + 1 Daughter   5 grandchildren      Social Drivers of Corporate investment banker Strain: Low Risk  (03/26/2023)   Overall Financial Resource Strain (CARDIA)    Difficulty of Paying Living Expenses: Not hard at all  Food Insecurity: No Food Insecurity (03/26/2023)   Hunger Vital Sign    Worried About Running Out of Food in the Last Year: Never true    Ran Out of Food in the Last Year: Never true  Transportation Needs: No Transportation Needs (03/26/2023)   PRAPARE - Administrator, Civil Service (Medical): No    Lack of Transportation (Non-Medical): No  Physical Activity: Sufficiently Active (03/26/2023)   Exercise Vital Sign    Days of Exercise per Week: 7 days    Minutes of Exercise per Session: 30 min  Stress: No Stress Concern Present (03/26/2023)   [employer] of Occupational Health - Occupational Stress Questionnaire    Feeling of Stress : Not at all  Social Connections: Socially Integrated (03/26/2023)   Social Connection and Isolation Panel [NHANES]    Frequency of Communication with Friends and Family: More than three times a week    Frequency of Social Gatherings with Friends and Family: More than three times a week    Attends Religious Services: More than 4 times per year    Active Member of [employer] or Organizations: Yes    Attends Engineer, structural: More than 4 times per year    Marital Status: Married    Tobacco Counseling Counseling given: Not Answered    Clinical Intake:  Pre-visit preparation  completed: Yes  Pain : No/denies pain     BMI - recorded: 25.25 Nutritional Status: BMI 25 -29 Overweight Nutritional Risks: None Diabetes: No  How often do you need to have someone help you when you read instructions, pamphlets, or other written materials from your doctor or pharmacy?: 1 - Never  Interpreter Needed?: No  Information entered by :: Theresa Mulligan LPN   Activities of Daily Living     03/26/2023    3:05 PM  In your present state of health, do you have any difficulty performing the following activities:  Hearing? 1  Comment Wears Hearing Aids  Vision? 0  Difficulty  concentrating or making decisions? 0  Walking or climbing stairs? 0  Dressing or bathing? 0  Doing errands, shopping? 0  Preparing Food and eating ? N  Using the Toilet? N  In the past six months, have you accidently leaked urine? N  Do you have problems with loss of bowel control? N  Managing your Medications? N  Managing your Finances? N  Housekeeping or managing your Housekeeping? N    Patient Care Team: [place], [name], MD as PCP - General (Family Medicine) Jake Bathe, MD as PCP - Cardiology (Cardiology) Lyn Records, MD (Inactive) as Consulting Physician (Cardiology) Ihor Gully, MD (Inactive) as Consulting Physician (Urology) Manning Charity, OD as Referring Physician (Optometry)  Indicate any recent Medical Services you may have received from other than [facility] providers in the past year (date may be approximate).     Assessment:   This is a routine wellness examination for Makhi.  Hearing/Vision screen Hearing Screening - Comments:: Wears Hearing Aids Vision Screening - Comments:: Wears rx glasses - up to date with routine eye exams with  Dr Emily Filbert   Goals Addressed               This Visit's Progress     Increase physical activity (pt-stated)         Depression Screen     03/26/2023    3:04 PM 03/15/2022    9:50 AM 02/10/2022    8:47 AM 02/16/2021    8:57 AM 02/11/2020     8:51 AM 01/23/2020    7:58 AM 01/21/2019    1:13 PM  PHQ 2/9 Scores  PHQ - 2 Score 0 0 0 0 0 0 0    Fall Risk     03/26/2023    3:06 PM 03/15/2022    9:52 AM 03/15/2022    8:15 AM 02/10/2022    8:47 AM 02/16/2021    9:00 AM  Fall Risk   Falls in the past year? 0 0 0 0 0  Number falls in past yr: 0  0 0 0  Injury with Fall? 0  0 0 0  Risk for fall due to : No Fall Risks  No Fall Risks Other (Comment) No Fall Risks  Follow up Falls prevention discussed;Falls evaluation completed  Falls prevention discussed Falls evaluation completed     MEDICARE RISK AT HOME:  Medicare Risk at Home Any stairs in or around the home?: Yes If so, are there any without handrails?: No Home free of loose throw rugs in walkways, pet beds, electrical cords, etc?: Yes Adequate lighting in your home to reduce risk of falls?: Yes Life alert?: No Use of a cane, walker or w/c?: No Grab bars in the bathroom?: Yes Shower chair or bench in shower?: Yes Elevated toilet seat or a handicapped toilet?: Yes  TIMED UP AND GO:  Was the test performed?  No  Cognitive Function: 6CIT completed    09/11/2017    8:31 AM  MMSE - Mini Mental State Exam  Not completed: --        03/26/2023    3:06 PM 03/15/2022    9:54 AM 02/16/2021    9:01 AM 02/11/2020    8:56 AM 01/21/2019    1:15 PM  6CIT Screen  What Year? 0 points 0 points 0 points 0 points 0 points  What month? 0 points 0 points 0 points 0 points 0 points  What time? 0 points 0 points 0 points  Count back from 20 0 points 0 points 0 points 0 points 0 points  Months in reverse 0 points 0 points 0 points 0 points 0 points  Repeat phrase 0 points 0 points 0 points 0 points 0 points  Total Score 0 points 0 points 0 points      Immunizations Immunization History  Administered Date(s) Administered   Fluad Quad(high Dose 65+) 09/12/2018, 10/21/2022   Influenza Split 09/18/2012   Influenza Whole 11/01/2006, 10/09/2008, 09/23/2011   Influenza, High Dose Seasonal  PF 10/07/2013, 09/30/2017   Influenza-Unspecified 10/04/2014, 10/05/2015, 09/20/2016, 09/18/2019, 10/19/2020, 09/30/2021   Moderna Sars-Covid-2 Vaccination 10/21/2022   PFIZER(Purple Top)SARS-COV-2 Vaccination 01/23/2019, 02/11/2019, 10/10/2019, 11/17/2020   Pneumococcal Conjugate-13 10/07/2013   Pneumococcal Polysaccharide-23 01/09/1997, 07/01/2004, 08/15/2012   Td 06/26/2006   Tdap 07/21/2010   Unspecified SARS-COV-2 Vaccination 09/30/2021   Zoster Recombinant(Shingrix) 10/18/2018, 12/20/2018   Zoster, Live 09/23/2012    Screening Tests Health Maintenance  Topic Date Due   COVID-19 Vaccine (7 - 2024-25 season) 12/16/2022   Medicare Annual Wellness (AWV)  03/25/2024   Pneumonia Vaccine 74+ Years old  Completed   INFLUENZA VACCINE  Completed   Zoster Vaccines- Shingrix  Completed   HPV VACCINES  Aged Out   DTaP/Tdap/Td  Discontinued    Health Maintenance  Health Maintenance Due  Topic Date Due   COVID-19 Vaccine (7 - 2024-25 season) 12/16/2022   Health Maintenance Items Addressed:    Additional Screening:  Vision Screening: Recommended annual ophthalmology exams for early detection of glaucoma and other disorders of the eye.  Dental Screening: Recommended annual dental exams for proper oral hygiene  Community Resource Referral / Chronic Care Management: CRR required this visit?  No   CCM required this visit?  No     Plan:     I have personally reviewed and noted the following in the patient's chart:   Medical and social history Use of alcohol, tobacco or illicit drugs  Current medications and supplements including opioid prescriptions. Patient is not currently taking opioid prescriptions. Functional ability and status Nutritional status Physical activity Advanced directives List of other physicians Hospitalizations, surgeries, and ER visits in previous 12 months Vitals Screenings to include cognitive, depression, and falls Referrals and  appointments  In addition, I have reviewed and discussed with patient certain preventive protocols, quality metrics, and best practice recommendations. A written personalized care plan for preventive services as well as general preventive health recommendations were provided to patient.     Tillie Rung, LPN   06/13/5407   After Visit Summary: (MyChart) Due to this being a telephonic visit, the after visit summary with patients personalized plan was offered to patient via MyChart   Notes: Nothing significant to report at this time.

## 2023-03-26 NOTE — Patient Instructions (Addendum)
 William York , Thank you for taking time to come for your Medicare Wellness Visit. I appreciate your ongoing commitment to your health goals. Please review the following plan we discussed and let me know if I can assist you in the future.   Referrals/Orders/Follow-Ups/Clinician Recommendations:   This is a list of the screening recommended for you and due dates:  Health Maintenance  Topic Date Due   COVID-19 Vaccine (7 - 2024-25 season) 12/16/2022   Medicare Annual Wellness Visit  03/25/2024   Pneumonia Vaccine  Completed   Flu Shot  Completed   Zoster (Shingles) Vaccine  Completed   HPV Vaccine  Aged Out   DTaP/Tdap/Td vaccine  Discontinued    Advanced directives: (Copy Requested) Please bring a copy of your health care power of attorney and living will to the office to be added to your chart at your convenience. You can mail to [facility] [address] [place], [place] [address] or email to [email or web address]  Next Medicare Annual Wellness Visit scheduled for next year: Yes

## 2023-04-09 DIAGNOSIS — L308 Other specified dermatitis: Secondary | ICD-10-CM | POA: Diagnosis not present

## 2023-04-09 DIAGNOSIS — L812 Freckles: Secondary | ICD-10-CM | POA: Diagnosis not present

## 2023-04-09 DIAGNOSIS — Z85828 Personal history of other malignant neoplasm of skin: Secondary | ICD-10-CM | POA: Diagnosis not present

## 2023-04-09 DIAGNOSIS — L820 Inflamed seborrheic keratosis: Secondary | ICD-10-CM | POA: Diagnosis not present

## 2023-04-09 DIAGNOSIS — L821 Other seborrheic keratosis: Secondary | ICD-10-CM | POA: Diagnosis not present

## 2023-05-30 DIAGNOSIS — D1801 Hemangioma of skin and subcutaneous tissue: Secondary | ICD-10-CM | POA: Diagnosis not present

## 2023-05-30 DIAGNOSIS — D485 Neoplasm of uncertain behavior of skin: Secondary | ICD-10-CM | POA: Diagnosis not present

## 2023-05-30 DIAGNOSIS — Z85828 Personal history of other malignant neoplasm of skin: Secondary | ICD-10-CM | POA: Diagnosis not present

## 2023-05-30 DIAGNOSIS — L812 Freckles: Secondary | ICD-10-CM | POA: Diagnosis not present

## 2023-05-30 DIAGNOSIS — L853 Xerosis cutis: Secondary | ICD-10-CM | POA: Diagnosis not present

## 2023-05-30 DIAGNOSIS — D2272 Melanocytic nevi of left lower limb, including hip: Secondary | ICD-10-CM | POA: Diagnosis not present

## 2023-05-30 DIAGNOSIS — C4441 Basal cell carcinoma of skin of scalp and neck: Secondary | ICD-10-CM | POA: Diagnosis not present

## 2023-05-30 DIAGNOSIS — L821 Other seborrheic keratosis: Secondary | ICD-10-CM | POA: Diagnosis not present

## 2023-06-16 ENCOUNTER — Other Ambulatory Visit: Payer: Self-pay | Admitting: Family Medicine

## 2023-06-20 ENCOUNTER — Encounter: Payer: Self-pay | Admitting: Cardiology

## 2023-06-25 ENCOUNTER — Ambulatory Visit (INDEPENDENT_AMBULATORY_CARE_PROVIDER_SITE_OTHER): Admitting: Family Medicine

## 2023-06-25 ENCOUNTER — Encounter: Payer: Self-pay | Admitting: Family Medicine

## 2023-06-25 VITALS — BP 120/60 | HR 70 | Temp 98.5°F | Resp 16 | Ht 70.0 in | Wt 195.0 lb

## 2023-06-25 DIAGNOSIS — M79604 Pain in right leg: Secondary | ICD-10-CM

## 2023-06-25 DIAGNOSIS — M79605 Pain in left leg: Secondary | ICD-10-CM | POA: Diagnosis not present

## 2023-06-25 DIAGNOSIS — I10 Essential (primary) hypertension: Secondary | ICD-10-CM | POA: Diagnosis not present

## 2023-06-25 NOTE — Assessment & Plan Note (Signed)
 BP adequately controlled. Continue amlodipine  5 mg daily and losartan  100 mg daily. Continue low-salt diet.

## 2023-06-25 NOTE — Progress Notes (Signed)
 ACUTE VISIT Chief Complaint  Patient presents with   Leg Problem    Both legs from knees down feel tired & ache for the last two weeks.    HPI: William York is a 83 y.o. male with a PMHx significant for HLD, CAD, allergic rhinitis, OA, and hearing loss, who is here today complaining of lower leg pain as described above.  Patient complains of a new constant lower leg pain and fatigue for 2 weeks.  Right now, he describes the pain as an achy pain,mainly calves, and rates it as a 5-6/10 but says it can get worse, especially when he walks.   Leg Pain  The incident occurred more than 1 week ago. There was no injury mechanism. The pain is moderate. The pain has been Constant since onset. Pertinent negatives include no inability to bear weight, loss of motion, loss of sensation, muscle weakness, numbness or tingling.  No hx of trauma. No associated edema, erythema,or cyanosis.  The pain is unchanged since its onset, and both legs hurt equally.  He has tried advil, but it has not helped much.  Denies focal weakness,walking difficulties, back pain, or skin changes. Pain does not interfere with sleep.  HTN on Amlodipine  5 mg daily and losartan  100 mg daily. CAD on Atorvastatin  20 mg daily and Aspirin  81 mg daily.  Lab Results  Component Value Date   NA 141 02/10/2022   CL 106 02/10/2022   K 4.4 02/10/2022   CO2 26 02/10/2022   BUN 17 02/10/2022   CREATININE 0.79 02/10/2022   GFR 83.61 02/10/2022   CALCIUM  9.5 02/10/2022   ALBUMIN 4.4 02/10/2022   GLUCOSE 90 02/10/2022   Lab Results  Component Value Date   ALT 24 02/10/2022   AST 22 02/10/2022   ALKPHOS 70 02/10/2022   BILITOT 0.8 02/10/2022   Lab Results  Component Value Date   CHOL 135 02/10/2022   HDL 57.30 02/10/2022   LDLCALC 54 02/10/2022   TRIG 119.0 02/10/2022   CHOLHDL 2 02/10/2022   Review of Systems  Constitutional:  Negative for activity change, appetite change and unexpected weight change.   Respiratory:  Negative for cough and wheezing.   Gastrointestinal:  Negative for abdominal pain, nausea and vomiting.  Endocrine: Negative for cold intolerance and heat intolerance.  Genitourinary:  Negative for decreased urine volume, dysuria and hematuria.  Neurological:  Negative for tingling, syncope and numbness.  See other pertinent positives and negatives in HPI.  Current Outpatient Medications on File Prior to Visit  Medication Sig Dispense Refill   amLODipine  (NORVASC ) 5 MG tablet TAKE 1 TABLET BY MOUTH DAILY 90 tablet 3   aspirin  EC 81 MG tablet Take 1 tablet (81 mg total) by mouth daily. 90 tablet 3   atorvastatin  (LIPITOR) 20 MG tablet TAKE 1 TABLET BY MOUTH ONCE  DAILY 100 tablet 2   losartan  (COZAAR ) 100 MG tablet Take 1 tablet (100 mg total) by mouth daily. Due for physical. 100 tablet 0   metoprolol  succinate (TOPROL -XL) 50 MG 24 hr tablet TAKE 1 TABLET BY MOUTH  DAILY WITH OR IMMEDIATELY  FOLLOWING A MEAL. DUE FOR FOLLOW UP WITH PCP. 100 tablet 0   nitroGLYCERIN  (NITROSTAT ) 0.4 MG SL tablet PLACE 1 TABLET UNDER THE TONGUE EVERY 5 MINUTES AS NEEDED. 25 tablet 0   valACYclovir  (VALTREX ) 1000 MG tablet TAKE 2 TABLETS BY MOUTH  UPON ACUTE ONSET AND REPEAT DOSE IN 12 HOURS. 16 tablet 3   No current facility-administered medications  on file prior to visit.   Past Medical History:  Diagnosis Date   Allergy    CAD (coronary artery disease)    ED (erectile dysfunction)    Hyperlipidemia    Hypertension    Internal hemorrhoids    OA (osteoarthritis)    No Known Allergies  Social History   Socioeconomic History   Marital status: Married    Spouse name: Not on file   Number of children: 2   Years of education: 18 years   Highest education level: Master's degree (e.g., MA, MS, MEng, MEd, MSW, MBA)  Occupational History   Occupation: Retired  Tobacco Use   Smoking status: Never   Smokeless tobacco: Never  Vaping Use   Vaping status: Never Used  Substance and Sexual  Activity   Alcohol use: Yes    Comment: one daily    Drug use: No   Sexual activity: Not on file  Other Topics Concern   Not on file  Social History Narrative   Married    HH 2   1 son + 1 Daughter   5 grandchildren      Social Drivers of Corporate investment banker Strain: Low Risk  (03/26/2023)   Overall Financial Resource Strain (CARDIA)    Difficulty of Paying Living Expenses: Not hard at all  Food Insecurity: No Food Insecurity (03/26/2023)   Hunger Vital Sign    Worried About Running Out of Food in the Last Year: Never true    Ran Out of Food in the Last Year: Never true  Transportation Needs: No Transportation Needs (03/26/2023)   PRAPARE - Administrator, Civil Service (Medical): No    Lack of Transportation (Non-Medical): No  Physical Activity: Sufficiently Active (03/26/2023)   Exercise Vital Sign    Days of Exercise per Week: 7 days    Minutes of Exercise per Session: 30 min  Stress: No Stress Concern Present (03/26/2023)   [employer] of Occupational Health - Occupational Stress Questionnaire    Feeling of Stress : Not at all  Social Connections: Socially Integrated (03/26/2023)   Social Connection and Isolation Panel    Frequency of Communication with Friends and Family: More than three times a week    Frequency of Social Gatherings with Friends and Family: More than three times a week    Attends Religious Services: More than 4 times per year    Active Member of Clubs or Organizations: Yes    Attends Banker Meetings: More than 4 times per year    Marital Status: Married    Vitals:   06/25/23 0851  BP: 120/60  Pulse: 70  Resp: 16  Temp: 98.5 F (36.9 C)  SpO2: 96%   Body mass index is 27.98 kg/m.  Physical Exam Vitals and nursing note reviewed.  Constitutional:      General: He is not in acute distress.    Appearance: He is well-developed.  HENT:     Head: Normocephalic and atraumatic.   Eyes:      Conjunctiva/sclera: Conjunctivae normal.    Cardiovascular:     Rate and Rhythm: Normal rate and regular rhythm.     Pulses:          Popliteal pulses are 2+ on the right side and 2+ on the left side.       Dorsalis pedis pulses are 2+ on the right side and 2+ on the left side.       Posterior tibial  pulses are 2+ on the right side and 0 on the left side.     Heart sounds: Murmur (Soft SEM RUSB) heard.     Comments: Trace bilateral pitting edema. Varicose veins LE's, bilateral. Pulmonary:     Effort: Pulmonary effort is normal. No respiratory distress.     Breath sounds: Normal breath sounds.  Abdominal:     Palpations: Abdomen is soft. There is no mass.     Tenderness: There is no abdominal tenderness.   Musculoskeletal:     Lumbar back: No tenderness. Negative right straight leg raise test and negative left straight leg raise test.   Skin:    General: Skin is warm.     Findings: No erythema or rash.   Neurological:     Mental Status: He is alert and oriented to person, place, and time.     Gait: Gait normal.   Psychiatric:        Mood and Affect: Mood and affect normal.   ASSESSMENT AND PLAN:  William York was seen today for lower leg soreness.  Lab Results  Component Value Date   WBC 7.1 06/25/2023   HGB 14.3 06/25/2023   HCT 43.5 06/25/2023   MCV 98 (H) 06/25/2023   PLT 228 06/25/2023   Lab Results  Component Value Date   NA 140 06/25/2023   CL 108 (H) 06/25/2023   K 4.5 06/25/2023   CO2 19 (L) 06/25/2023   BUN 23 06/25/2023   CREATININE 0.76 06/25/2023   EGFR 90 06/25/2023   CALCIUM  9.4 06/25/2023   ALBUMIN 4.2 06/25/2023   GLUCOSE 102 (H) 06/25/2023   Lab Results  Component Value Date   ALT 21 06/25/2023   AST 19 06/25/2023   ALKPHOS 91 06/25/2023   BILITOT 0.6 06/25/2023   Lab Results  Component Value Date   VITAMINB12 578 06/25/2023   Lab Results  Component Value Date   TSH 1.010 06/25/2023   Pain in both lower extremities We discussed  possible etiologies including claudication, radicular pain, musculoskeletal, and vein disease. Pulses palpable. Recommend LE elevation and compression stockings. Monitor for new symptoms. Topical icy hot may help. Instructed about warning signs. ABI will be arranged.  -     VAS US  ABI WITH/WO TBI; Future -     CBC; Future -     Comprehensive metabolic panel with GFR; Future -     Vitamin B12; Future -     TSH; Future  Essential hypertension Assessment & Plan: BP adequately controlled. Continue amlodipine  5 mg daily and losartan  100 mg daily. Continue low-salt diet.  Orders: -     CBC; Future -     Comprehensive metabolic panel with GFR; Future -     TSH; Future  Return if symptoms worsen or fail to improve, for keep next appointment.  I, Fritz Jewel Wierda, acting as a scribe for Anberlyn Feimster [place], MD., have documented all relevant documentation on the behalf of Lurline Caver [place], MD, as directed by  Jeanluc Wegman [place], MD while in the presence of Lamar Naef [place], MD.   I, Alysandra Lobue [place], MD, have reviewed all documentation for this visit. The documentation on 06/25/23 for the exam, diagnosis, procedures, and orders are all accurate and complete.  Tjuana Vickrey G. [place], MD  [facility]. [place] office.

## 2023-06-25 NOTE — Patient Instructions (Signed)
 A few things to remember from today's visit:  Pain in both lower extremities - Plan: VAS US  ABI WITH/WO TBI, CBC, Comprehensive metabolic panel with GFR, Vitamin B12, TSH  Essential hypertension - Plan: CBC, Comprehensive metabolic panel with GFR, TSH I am thinking it may be related to vein disease. Monitor for new problems. Try compression stockings.  If you need refills for medications you take chronically, please call your pharmacy. Do not use My Chart to request refills or for acute issues that need immediate attention. If you send a my chart message, it may take a few days to be addressed, specially if I am not in the office.  Please be sure medication list is accurate. If a new problem present, please set up appointment sooner than planned today.

## 2023-06-26 ENCOUNTER — Ambulatory Visit: Payer: Self-pay | Admitting: Family Medicine

## 2023-06-26 DIAGNOSIS — M79604 Pain in right leg: Secondary | ICD-10-CM

## 2023-06-26 LAB — COMPREHENSIVE METABOLIC PANEL WITH GFR
ALT: 21 IU/L (ref 0–44)
AST: 19 IU/L (ref 0–40)
Albumin: 4.2 g/dL (ref 3.7–4.7)
Alkaline Phosphatase: 91 IU/L (ref 44–121)
BUN/Creatinine Ratio: 30 — ABNORMAL HIGH (ref 10–24)
BUN: 23 mg/dL (ref 8–27)
Bilirubin Total: 0.6 mg/dL (ref 0.0–1.2)
CO2: 19 mmol/L — ABNORMAL LOW (ref 20–29)
Calcium: 9.4 mg/dL (ref 8.6–10.2)
Chloride: 108 mmol/L — ABNORMAL HIGH (ref 96–106)
Creatinine, Ser: 0.76 mg/dL (ref 0.76–1.27)
Globulin, Total: 2.3 g/dL (ref 1.5–4.5)
Glucose: 102 mg/dL — ABNORMAL HIGH (ref 70–99)
Potassium: 4.5 mmol/L (ref 3.5–5.2)
Sodium: 140 mmol/L (ref 134–144)
Total Protein: 6.5 g/dL (ref 6.0–8.5)
eGFR: 90 mL/min/{1.73_m2} (ref >59)

## 2023-06-26 LAB — CBC
Hematocrit: 43.5 % (ref 37.5–51.0)
Hemoglobin: 14.3 g/dL (ref 13.0–17.7)
MCH: 32.1 pg (ref 26.6–33.0)
MCHC: 32.9 g/dL (ref 31.5–35.7)
MCV: 98 fL — ABNORMAL HIGH (ref 79–97)
Platelets: 228 10*3/uL (ref 150–450)
RBC: 4.45 x10E6/uL (ref 4.14–5.80)
RDW: 12.1 % (ref 11.6–15.4)
WBC: 7.1 10*3/uL (ref 3.4–10.8)

## 2023-06-26 LAB — VITAMIN B12: Vitamin B-12: 578 pg/mL (ref 232–1245)

## 2023-06-26 LAB — TSH: TSH: 1.01 u[IU]/mL (ref 0.450–4.500)

## 2023-07-19 ENCOUNTER — Ambulatory Visit (HOSPITAL_COMMUNITY)
Admission: RE | Admit: 2023-07-19 | Discharge: 2023-07-19 | Disposition: A | Discharge status: Home / Self Care | Source: Ambulatory Visit | Attending: Family Medicine | Admitting: Family Medicine

## 2023-07-19 DIAGNOSIS — M79605 Pain in left leg: Secondary | ICD-10-CM | POA: Insufficient documentation

## 2023-07-19 DIAGNOSIS — M79604 Pain in right leg: Secondary | ICD-10-CM | POA: Diagnosis not present

## 2023-07-20 LAB — VAS US ABI WITH/WO TBI
Left ABI: 1.05
Right ABI: 1.09

## 2023-08-25 ENCOUNTER — Other Ambulatory Visit: Payer: Self-pay | Admitting: Family Medicine

## 2023-08-29 NOTE — Progress Notes (Unsigned)
 Patient ID: William York, male   DOB: Sep 09, 1940, 83 y.o.   MRN: [id number]  Reason for Consult: No chief complaint on file.   Referred by [place], [name], MD  Subjective:     HPI William York is a 83 y.o. male presenting for evaluation of lower extremity pain. ***  Past Medical History:  Diagnosis Date   Allergy    CAD (coronary artery disease)    ED (erectile dysfunction)    Hyperlipidemia    Hypertension    Internal hemorrhoids    OA (osteoarthritis)    Family History  Problem Relation Age of Onset   Colon cancer Cousin        Mother side-First cousin   Stroke Father    Past Surgical History:  Procedure Laterality Date   CORONARY ARTERY BYPASS GRAFT  June 1999   HERNIA REPAIR  May 2006   MASS EXCISION Right 01/27/2020   Procedure: RIGHT SMALL FINGER EXCISION MUCOID CYST;  Surgeon: Murrell Drivers, MD;  Location: [facility];  Service: Orthopedics;  Laterality: Right;  Bier block    Short Social History:  Social History   Tobacco Use   Smoking status: Never   Smokeless tobacco: Never  Substance Use Topics   Alcohol use: Yes    Comment: one daily     No Known Allergies  Current Outpatient Medications  Medication Sig Dispense Refill   amLODipine  (NORVASC ) 5 MG tablet TAKE 1 TABLET BY MOUTH DAILY 90 tablet 3   aspirin  EC 81 MG tablet Take 1 tablet (81 mg total) by mouth daily. 90 tablet 3   atorvastatin  (LIPITOR) 20 MG tablet TAKE 1 TABLET BY MOUTH ONCE  DAILY 100 tablet 2   losartan  (COZAAR ) 100 MG tablet TAKE 1 TABLET BY MOUTH DAILY 100 tablet 2   metoprolol  succinate (TOPROL -XL) 50 MG 24 hr tablet TAKE 1 TABLET BY MOUTH  DAILY WITH OR IMMEDIATELY  FOLLOWING A MEAL. DUE FOR FOLLOW UP WITH PCP. 100 tablet 0   nitroGLYCERIN  (NITROSTAT ) 0.4 MG SL tablet PLACE 1 TABLET UNDER THE TONGUE EVERY 5 MINUTES AS NEEDED. 25 tablet 0   valACYclovir  (VALTREX ) 1000 MG tablet TAKE 2 TABLETS BY MOUTH  UPON ACUTE ONSET AND REPEAT DOSE IN 12 HOURS. 16 tablet 3    No current facility-administered medications for this visit.    REVIEW OF SYSTEMS  All other systems were reviewed and are negative     Objective:  Objective   There were no vitals filed for this visit. There is no height or weight on file to calculate BMI.  Physical Exam General: no acute distress Cardiac: hemodynamically stable Pulm: normal work of breathing Abdomen: non-tender, no pulsatile mass*** Neuro: alert, no focal deficit Extremities: no edema, cyanosis or wounds*** Vascular:   Right: ***  Left: ***  Data: ABI ABI Findings:  +---------+------------------+-----+-----------+--------+  Right   Rt Pressure (mmHg)IndexWaveform   Comment   +---------+------------------+-----+-----------+--------+  Brachial 145                                         +---------+------------------+-----+-----------+--------+  PTA     159               1.09 biphasic             +---------+------------------+-----+-----------+--------+  DP      151  1.03 multiphasic          +---------+------------------+-----+-----------+--------+  Great Toe101               0.69 Abnormal             +---------+------------------+-----+-----------+--------+   +---------+------------------+-----+----------+-------+  Left    Lt Pressure (mmHg)IndexWaveform  Comment  +---------+------------------+-----+----------+-------+  Brachial 146                                       +---------+------------------+-----+----------+-------+  PTA     147               1.01 monophasic         +---------+------------------+-----+----------+-------+  DP      154               1.05 triphasic          +---------+------------------+-----+----------+-------+  Great Toe53                0.36 Abnormal           +---------+------------------+-----+----------+-------+   +-------+-----------+-----------+------------+------------+  ABI/TBIToday's  ABIToday's TBIPrevious ABIPrevious TBI  +-------+-----------+-----------+------------+------------+  Right 1.09       0.69                                 +-------+-----------+-----------+------------+------------+  Left  1.05       0.36                                 +-------+-----------+-----------+------------+------------+   CMP reviewed, creatinine 0.76  A1c reviewed, 6.1     Assessment/Plan:   William York is a 83 y.o. male with ***  Recommendations to optimize cardiovascular risk: Abstinence from all tobacco products. Blood glucose control with goal A1c < 7%. Blood pressure control with goal blood pressure < 140/90 mmHg. Lipid reduction therapy with goal LDL-C <100 mg/dL  Aspirin  81mg  PO QD.  Atorvastatin  40-80mg  PO QD (or other high intensity statin therapy).   Norman GORMAN Serve MD [facility]

## 2023-08-31 ENCOUNTER — Encounter: Payer: Self-pay | Admitting: Vascular Surgery

## 2023-08-31 ENCOUNTER — Ambulatory Visit: Attending: Vascular Surgery | Admitting: Vascular Surgery

## 2023-08-31 VITALS — BP 140/66 | HR 69 | Temp 98.1°F | Resp 18 | Ht 70.0 in | Wt 188.3 lb

## 2023-08-31 DIAGNOSIS — I872 Venous insufficiency (chronic) (peripheral): Secondary | ICD-10-CM | POA: Diagnosis not present

## 2023-08-31 DIAGNOSIS — M7989 Other specified soft tissue disorders: Secondary | ICD-10-CM

## 2023-09-19 ENCOUNTER — Other Ambulatory Visit: Payer: Self-pay | Admitting: Family Medicine

## 2023-09-19 DIAGNOSIS — E7800 Pure hypercholesterolemia, unspecified: Secondary | ICD-10-CM

## 2023-09-25 ENCOUNTER — Telehealth: Payer: Self-pay

## 2023-09-25 NOTE — Telephone Encounter (Signed)
 Patient called to buy purchase 2 pair of medium compression hose 15-20.  Patient was told hose are at 4th floor check in zone A.  Patient verbalized understanding.

## 2023-10-05 ENCOUNTER — Ambulatory Visit: Admitting: Family Medicine

## 2023-10-05 VITALS — BP 150/60 | HR 77 | Temp 98.1°F | Wt 191.2 lb

## 2023-10-05 DIAGNOSIS — J020 Streptococcal pharyngitis: Secondary | ICD-10-CM

## 2023-10-05 DIAGNOSIS — J029 Acute pharyngitis, unspecified: Secondary | ICD-10-CM | POA: Diagnosis not present

## 2023-10-05 LAB — POCT RAPID STREP A (OFFICE): Rapid Strep A Screen: POSITIVE — AB

## 2023-10-05 MED ORDER — AMOXICILLIN 500 MG PO CAPS: 500.0000 mg | ORAL_CAPSULE | Freq: Two times a day (BID) | ORAL | 0 refills | Status: AC

## 2023-10-05 NOTE — Progress Notes (Signed)
 Established Patient Office Visit  Subjective   Patient ID: William York, male    DOB: [birth year]  Age: 83 y.o. MRN: [id number]  Chief Complaint  Patient presents with   Sore Throat    HPI    Mr. Kutzer is seen with sore throat symptoms which started about a week ago.  He has had very minimal nasal congestion.  Has had some nighttime cough.  Sore throat symptoms are worse at night.  No documented fever.  States this did not start like typical cold-like symptoms.  No known sick contacts.  Denies any body aches.  No nausea or vomiting.  Has had some mild laryngitis symptoms.  Past Medical History:  Diagnosis Date   Allergy    CAD (coronary artery disease)    ED (erectile dysfunction)    Hyperlipidemia    Hypertension    Internal hemorrhoids    OA (osteoarthritis)    Past Surgical History:  Procedure Laterality Date   CORONARY ARTERY BYPASS GRAFT  June 1999   HERNIA REPAIR  May 2006   MASS EXCISION Right 01/27/2020   Procedure: RIGHT SMALL FINGER EXCISION MUCOID CYST;  Surgeon: Murrell Drivers, MD;  Location: [facility];  Service: Orthopedics;  Laterality: Right;  Bier block    reports that he has never smoked. He has never used smokeless tobacco. He reports current alcohol use. He reports that he does not use drugs. family history includes Colon cancer in his cousin; Stroke in his father. No Known Allergies  Review of Systems  Constitutional:  Negative for chills and fever.  HENT:  Positive for sore throat. Negative for ear pain and sinus pain.   Neurological:  Negative for headaches.      Objective:     BP (!) 150/60   Pulse 77   Temp 98.1 F (36.7 C) (Oral)   Wt 191 lb 3.2 oz (86.7 kg)   SpO2 95%   BMI 27.43 kg/m  BP Readings from Last 3 Encounters:  10/05/23 (!) 150/60  08/31/23 (!) 140/66  06/25/23 120/60   Wt Readings from Last 3 Encounters:  10/05/23 191 lb 3.2 oz (86.7 kg)  08/31/23 188 lb 4.8 oz (85.4 kg)  06/25/23 195 lb (88.5 kg)       Physical Exam Vitals reviewed.  Constitutional:      General: He is not in acute distress.    Appearance: He is not ill-appearing.  HENT:     Right Ear: Tympanic membrane normal.     Left Ear: Tympanic membrane normal.     Mouth/Throat:     Comments: No exudate.  Possibly some very mild posterior pharynx erythema.  No vesicles or ulcerations. Cardiovascular:     Rate and Rhythm: Normal rate.  Pulmonary:     Effort: Pulmonary effort is normal.     Breath sounds: Normal breath sounds. No wheezing or rales.  Musculoskeletal:     Cervical back: Neck supple.  Lymphadenopathy:     Cervical: No cervical adenopathy.  Neurological:     Mental Status: He is alert.      No results found for any visits on 10/05/23.    The ASCVD Risk score (Arnett DK, et al., 2019) failed to calculate for the following reasons:   The 2019 ASCVD risk score is only valid for ages 34 to 52    Assessment & Plan:   Sore throat of 1 week duration.  Does not have any red flags such as fever, cervical adenopathy,  weight loss, etc.  Check rapid strep= positive.  Treat with amoxicillin  500 mg twice daily for 10 days.  Continue Tylenol  as needed for symptoms.  Follow-up for any persistent or worsening symptoms  Wolm Scarlet, MD

## 2023-10-06 ENCOUNTER — Ambulatory Visit (HOSPITAL_COMMUNITY): Payer: Self-pay

## 2023-11-02 DIAGNOSIS — M65341 Trigger finger, right ring finger: Secondary | ICD-10-CM | POA: Diagnosis not present

## 2023-11-07 ENCOUNTER — Other Ambulatory Visit: Payer: Self-pay | Admitting: Family Medicine

## 2023-11-07 DIAGNOSIS — I2510 Atherosclerotic heart disease of native coronary artery without angina pectoris: Secondary | ICD-10-CM

## 2023-11-20 ENCOUNTER — Other Ambulatory Visit: Payer: Self-pay | Admitting: Cardiology

## 2023-11-28 ENCOUNTER — Telehealth: Payer: Self-pay | Admitting: Cardiology

## 2023-11-28 NOTE — Telephone Encounter (Signed)
 Placed call to pt.  He is aware that he has enough refills until 02/2024 and that he is due for an appt in January.. I scheduled him to see Orren Fabry, NP, 01/25/24.  Pt aware he can get his refills when he comes in.  Pt was grateful for us  getting back to him.

## 2023-11-28 NOTE — Telephone Encounter (Signed)
*  STAT* If patient is at the pharmacy, call can be transferred to refill team.   1. Which medications need to be refilled? (please list name of each medication and dose if known)   amLODipine  (NORVASC ) 5 MG tablet     2. Would you like to learn more about the convenience, safety, & potential cost savings by using the [facility]? No    3. Are you open to using the [facility] (Type [facility].No    4. Which pharmacy/location (including street and city if local pharmacy) is medication to be sent to? [id number] - [place], [place] - [address] AT [address] OF [address]     5. Do they need a 30 day or 90 day supply? 90 day

## 2023-12-05 ENCOUNTER — Other Ambulatory Visit: Payer: Self-pay | Admitting: Cardiology

## 2024-01-22 ENCOUNTER — Encounter: Payer: Self-pay | Admitting: Family Medicine

## 2024-01-22 DIAGNOSIS — M6530 Trigger finger, unspecified finger: Secondary | ICD-10-CM

## 2024-01-22 DIAGNOSIS — L989 Disorder of the skin and subcutaneous tissue, unspecified: Secondary | ICD-10-CM

## 2024-01-22 NOTE — Progress Notes (Unsigned)
 " Cardiology Office Note   Date:  01/24/2024  ID:  William York, DOB 06-15-1940, MRN [id number] PCP: Jordan, [name], MD  [facility] Providers Cardiologist:  Oneil Parchment, MD   History of Present Illness William York is a 84 y.o. male with past medical history of coronary artery disease, hypertension, osteoarthritis, CABG back in 1999 with Dr. Lucas here for follow-up appointment.  He was seen last January by Dr. Parchment and is doing well at that time.  Was tolerating his aspirin , atorvastatin , metoprolol  succinate, amlodipine .  Blood pressure is well-controlled.  Echo November 2019 showed EF of 60% and trivial mitral regurgitation.  Patient also had a chronic bifascicular block.  Reported no current health issues at last follow-up including chest pain or shortness of breath.  LDL was 54 as of February 7975.  Patient's hemoglobin A1c was 6.1 indicating prediabetic state.  Creatinine was 0.8 suggesting good kidney function.  EKG was stable and no changes were previous 1 despite a right bundle branch block.  Patient was retired and enjoyed an active lifestyle including volunteering and spending time with his grandchildren.  Today, he presents with a right bundle branch block and heart valve issues for a yearly cardiovascular follow-up.  His right bundle branch block remains stable on EKG compared with the prior year. A 2019 ultrasound showed mild calcification of the aortic valve leaflets, mildly thickened mitral valve leaflets, trivial mitral regurgitation, and mild pulmonic regurgitation.  He has had no chest pain or shortness of breath over the past year and has not needed nitroglycerin  since surgery in 1999.  He takes amlodipine  5 mg daily, Lipitor 20 mg, losartan  100 mg, and metoprolol  succinate 50 mg. His blood pressure and heart rate are reported to be excellent. He is due for repeat cholesterol testing, as his last blood work was in February of last year.  Reports no shortness  of breath nor dyspnea on exertion. Reports no chest pain, pressure, or tightness. No edema, orthopnea, PND. Reports no palpitations.   Discussed the use of AI scribe software for clinical note transcription with the patient, who gave verbal consent to proceed.  ROS: Pertinent ROS in HPI  Studies Reviewed EKG Interpretation Date/Time:  Thursday January 24 2024 07:54:16 EST Ventricular Rate:  69 PR Interval:  166 QRS Duration:  136 QT Interval:  420 QTC Calculation: 450 R Axis:   -59  Text Interpretation: Normal sinus rhythm Right bundle branch block Left anterior fascicular block Bifascicular block When compared with ECG of 25-Jan-2023 09:12, No significant change was found Confirmed by Lucien Blanc [phone]) on 01/24/2024 8:13:27 AM   Echo 11/21/2017  Study Conclusions   - Left ventricle: The cavity size was normal. There was mild    concentric hypertrophy. Systolic function was normal. The    estimated ejection fraction was in the range of 55% to 60%. Wall    motion was normal; there were no regional wall motion    abnormalities. Left ventricular diastolic function parameters    were normal.  - Aortic valve: Trileaflet; mildly thickened, mildly calcified    leaflets. There was trivial regurgitation.  - Aorta: Fibrocalcific change was present in the root.  - Mitral valve: Mildly thickened leaflets . There was trivial    regurgitation.  - Left atrium: The atrium was moderately dilated.  - Right ventricle: Systolic function was low normal.  - Atrial septum: No defect or patent foramen ovale was identified.  - Tricuspid valve: There was trivial regurgitation.  -  Pulmonic valve: There was mild regurgitation.   Impressions:   - Normal LV systolic and diastolic function. No wall motion    abnormalities. Mild PR. Aortic root calcification.   -------------------------------------------------------------------  Labs, prior tests, procedures, and surgery:  Coronary artery bypass  grafting.   -------------------------------------------------------------------  Study data:   Study status:  Routine.  Procedure:  The patient  reported no pain pre or post test. Transthoracic echocardiography  for left ventricular function evaluation, for right ventricular  function evaluation, and for assessment of valvular function. Image  quality was adequate.  Study completion:  There were no  complications.          Transthoracic echocardiography.  M-mode,  complete 2D, spectral Doppler, and color Doppler.  Birthdate:  Patient birthdate: 11-13-1940.  Age:  Patient is 84 yr old.  Sex:  Gender: male.    BMI: 27.4 kg/m^2.  Blood pressure:     132/70  Patient status:  Outpatient.  Study date:  Study date: 11/21/2017.  Study time: 07:46 AM.  Location:  [facility]   -------------------------------------------------------------------   -------------------------------------------------------------------  Left ventricle:  The cavity size was normal. There was mild  concentric hypertrophy. Systolic function was normal. The estimated  ejection fraction was in the range of 55% to 60%. Wall motion was  normal; there were no regional wall motion abnormalities. The  transmitral flow pattern was normal. The deceleration time of the  early transmitral flow velocity was normal. The pulmonary vein flow  pattern was normal. The tissue Doppler parameters were normal. Left  ventricular diastolic function parameters were normal.   -------------------------------------------------------------------  Aortic valve:   Trileaflet; mildly thickened, mildly calcified  leaflets. Mobility was not restricted.  Doppler:  Transvalvular  velocity was within the normal range. There was no stenosis. There  was trivial regurgitation.   -------------------------------------------------------------------  Aorta: Fibrocalcific change was present in the root. Aortic root:  The aortic root was normal in size.    -------------------------------------------------------------------  Mitral valve:   Mildly thickened leaflets . Mobility was not  restricted.  Doppler:  Transvalvular velocity was within the normal  range. There was no evidence for stenosis. There was trivial  regurgitation.    Peak gradient (D): 2 mm Hg.   -------------------------------------------------------------------  Left atrium:  The atrium was moderately dilated.   -------------------------------------------------------------------  Atrial septum:  No defect or patent foramen ovale was identified.    -------------------------------------------------------------------  Right ventricle:  The cavity size was normal. Wall thickness was  normal. Systolic function was low normal.   -------------------------------------------------------------------  Pulmonic valve:    The valve appears to be grossly normal.  Doppler:  Transvalvular velocity was within the normal range. There  was no evidence for stenosis. There was mild regurgitation.   -------------------------------------------------------------------  Tricuspid valve:   Structurally normal valve.    Doppler:  Transvalvular velocity was within the normal range. There was  trivial regurgitation.   -------------------------------------------------------------------  Pulmonary artery:   The main pulmonary artery was normal-sized.  Systolic pressure was within the normal range.   -------------------------------------------------------------------  Right atrium:  The atrium was normal in size.   -------------------------------------------------------------------  Pericardium: There was no pericardial effusion.   -------------------------------------------------------------------  Systemic veins:  Inferior vena cava: The vessel was normal in size.    Physical Exam VS:  BP 130/68   Pulse 70   Ht 5' 10 (1.778 m)   Wt 192 lb (87.1 kg)   SpO2 96%   BMI 27.55 kg/m  Wt Readings from Last 3 Encounters:  01/24/24 192 lb (87.1 kg)  10/05/23 191 lb 3.2 oz (86.7 kg)  08/31/23 188 lb 4.8 oz (85.4 kg)    GEN: Well nourished, well developed in no acute distress NECK: No JVD; No carotid bruits CARDIAC: RRR, no murmurs, rubs, gallops RESPIRATORY:  Clear to auscultation without rales, wheezing or rhonchi  ABDOMEN: Soft, non-tender, non-distended EXTREMITIES:  No edema; No deformity   ASSESSMENT AND PLAN  Atherosclerotic heart disease status post coronary artery bypass grafting No symptoms reported in the past year. Heart rate and blood pressure well-controlled. No recent nitroglycerin  use. - Continue amlodipine  5 mg daily, Lipitor 20 mg, Cozaar  100 mg, metoprolol  succinate 50 mg. - Ensure nitroglycerin  is available for emergency use.  Valvular heart disease (aortic valve calcification, mitral valve thickening, mild pulmonic regurgitation) Previous echocardiogram in 2019 showed mild valve abnormalities. No recent echocardiogram since 2019. - Ordered echocardiogram to assess current status of heart valves.  Essential hypertension Blood pressure well-controlled with current medication regimen. - Continue amlodipine  5 mg daily, Cozaar  100 mg.  Pure hypercholesterolemia Due for cholesterol blood work. Discussed importance of fasting for accurate results. - Schedule fasting cholesterol blood work.  Right bundle branch block EKG shows right bundle branch block, consistent with previous findings. No changes noted compared to last year's EKG. - Continue monitoring with routine EKGs.      Dispo: He can follow-up in a year with Dr. Jeffrie  Signed, Orren LOISE Fabry, PA-C   "

## 2024-01-24 ENCOUNTER — Encounter: Payer: Self-pay | Admitting: Physician Assistant

## 2024-01-24 ENCOUNTER — Ambulatory Visit: Attending: Physician Assistant | Admitting: Physician Assistant

## 2024-01-24 VITALS — BP 130/68 | HR 70 | Ht 70.0 in | Wt 192.0 lb

## 2024-01-24 DIAGNOSIS — I25810 Atherosclerosis of coronary artery bypass graft(s) without angina pectoris: Secondary | ICD-10-CM | POA: Diagnosis not present

## 2024-01-24 DIAGNOSIS — E7800 Pure hypercholesterolemia, unspecified: Secondary | ICD-10-CM | POA: Diagnosis not present

## 2024-01-24 DIAGNOSIS — Z951 Presence of aortocoronary bypass graft: Secondary | ICD-10-CM | POA: Diagnosis not present

## 2024-01-24 DIAGNOSIS — I10 Essential (primary) hypertension: Secondary | ICD-10-CM | POA: Diagnosis not present

## 2024-01-24 DIAGNOSIS — I2510 Atherosclerotic heart disease of native coronary artery without angina pectoris: Secondary | ICD-10-CM

## 2024-01-24 MED ORDER — AMLODIPINE BESYLATE 5 MG PO TABS: 5.0000 mg | ORAL_TABLET | Freq: Every day | ORAL | 2 refills | Status: DC

## 2024-01-24 MED ORDER — METOPROLOL SUCCINATE ER 50 MG PO TB24: ORAL_TABLET | ORAL | 0 refills | Status: DC

## 2024-01-24 MED ORDER — AMLODIPINE BESYLATE 5 MG PO TABS: 5.0000 mg | ORAL_TABLET | Freq: Every day | ORAL | 2 refills | Status: AC

## 2024-01-24 MED ORDER — ATORVASTATIN CALCIUM 20 MG PO TABS: 20.0000 mg | ORAL_TABLET | Freq: Every day | ORAL | 2 refills | Status: DC

## 2024-01-24 MED ORDER — METOPROLOL SUCCINATE ER 50 MG PO TB24: ORAL_TABLET | ORAL | 0 refills | Status: AC

## 2024-01-24 MED ORDER — ATORVASTATIN CALCIUM 20 MG PO TABS: 20.0000 mg | ORAL_TABLET | Freq: Every day | ORAL | 2 refills | Status: AC

## 2024-01-24 MED ORDER — NITROGLYCERIN 0.4 MG SL SUBL: 0.4000 mg | SUBLINGUAL_TABLET | SUBLINGUAL | 3 refills | Status: AC | PRN

## 2024-01-24 MED ORDER — LOSARTAN POTASSIUM 100 MG PO TABS: 100.0000 mg | ORAL_TABLET | Freq: Every day | ORAL | 2 refills | Status: DC

## 2024-01-24 MED ORDER — LOSARTAN POTASSIUM 100 MG PO TABS: 100.0000 mg | ORAL_TABLET | Freq: Every day | ORAL | 2 refills | Status: AC

## 2024-01-24 NOTE — Patient Instructions (Signed)
 Medication Instructions:  Your physician recommends that you continue on your current medications as directed. Please refer to the Current Medication list given to you today. *If you need a refill on your cardiac medications before your next appointment, please call your pharmacy*  Lab Work: Complete at your convenience- FASTING LIPIDS & LFTS If you have labs (blood work) drawn today and your tests are completely normal, you will receive your results only by: MyChart Message (if you have MyChart) OR A paper copy in the mail If you have any lab test that is abnormal or we need to change your treatment, we will call you to review the results.  Testing/Procedures: Your physician has requested that you have an echocardiogram. Echocardiography is a painless test that uses sound waves to create images of your heart. It provides your doctor with information about the size and shape of your heart and how well your hearts chambers and valves are working. This procedure takes approximately one hour. There are no restrictions for this procedure. Please do NOT wear cologne, perfume, aftershave, or lotions (deodorant is allowed). Please arrive 15 minutes prior to your appointment time.  Please note: We ask at that you not bring children with you during ultrasound (echo/ vascular) testing. Due to room size and safety concerns, children are not allowed in the ultrasound rooms during exams. Our front office staff cannot provide observation of children in our lobby area while testing is being conducted. An adult accompanying a patient to their appointment will only be allowed in the ultrasound room at the discretion of the ultrasound technician under special circumstances. We apologize for any inconvenience.  Follow-Up: At [facility], you and your health needs are our priority.  As part of our continuing mission to provide you with exceptional heart care, our providers are all part of one team.  This  team includes your primary Cardiologist (physician) and Advanced Practice Providers or APPs (Physician Assistants and Nurse Practitioners) who all work together to provide you with the care you need, when you need it.  Your next appointment:   1 year(s)  Provider:   Oneil Parchment, MD    We recommend signing up for the patient portal called MyChart.  Sign up information is provided on this After Visit Summary.  MyChart is used to connect with patients for Virtual Visits (Telemedicine).  Patients are able to view lab/test results, encounter notes, upcoming appointments, etc.  Non-urgent messages can be sent to your provider as well.   To learn more about what you can do with MyChart, go to [email or web address].   Other Instructions

## 2024-01-28 NOTE — Addendum Note (Signed)
 Addended by: VANICE LUCIENNE PARAS on: 01/28/2024 06:55 AM   Modules accepted: Orders

## 2024-01-30 LAB — HEPATIC FUNCTION PANEL
ALT: 26 IU/L (ref 0–44)
AST: 25 IU/L (ref 0–40)
Albumin: 4.5 g/dL (ref 3.7–4.7)
Alkaline Phosphatase: 98 IU/L (ref 48–129)
Bilirubin Total: 0.9 mg/dL (ref 0.0–1.2)
Bilirubin, Direct: 0.29 mg/dL (ref 0.00–0.40)
Total Protein: 6.9 g/dL (ref 6.0–8.5)

## 2024-01-30 LAB — LIPID PANEL
Chol/HDL Ratio: 2.5 ratio (ref 0.0–5.0)
Cholesterol, Total: 139 mg/dL (ref 100–199)
HDL: 56 mg/dL
LDL Chol Calc (NIH): 60 mg/dL (ref 0–99)
Triglycerides: 135 mg/dL (ref 0–149)
VLDL Cholesterol Cal: 23 mg/dL (ref 5–40)

## 2024-02-01 ENCOUNTER — Telehealth: Payer: Self-pay | Admitting: Family Medicine

## 2024-02-01 NOTE — Telephone Encounter (Signed)
 Copied from CRM #[id number]. Topic: Referral - Prior Authorization Question >> Feb 01, 2024 11:08 AM Charolett L wrote: Reason for CRM: Beverely from [facility] dermatology  Stated that a PA needs to be requested through [employer] and put in through the [employer] website. She is requesting a call back so she can walk you through the steps of how to submit PA for 2 different doctors in the office that the patient is seeing..  Requesting a callback today before 12:30 because referral needs to be retro dated and Monday would be to late. They also close at 12:30 today  CB# [phone]

## 2024-02-02 ENCOUNTER — Ambulatory Visit: Payer: Self-pay | Admitting: Physician Assistant

## 2024-02-04 NOTE — Telephone Encounter (Signed)
 Attempted to call [employer] back regarding prior auth but no one was available. I was able to leave a voice message for someone to call me back.

## 2024-02-13 NOTE — Telephone Encounter (Signed)
 [facility] following up on authorization requested, states they received referral but insurance requires an authorization by primary care provider uploaded to [employer] [phone] direct number but on phones all the time if a better number to reach office let her know, only in office until 12:30 on Fridays    Previous Version    Yvone Hey T   02/06/2024 11:42 AM  [facility] is returning a call from [place]. I was advised by AL that [place] is not in [place] office. Tried to retrieve call, [facility] disconnected.

## 2024-02-21 ENCOUNTER — Ambulatory Visit (HOSPITAL_COMMUNITY)

## 2024-03-31 ENCOUNTER — Ambulatory Visit
